# Patient Record
Sex: Female | Born: 1945
Health system: Southern US, Community
[De-identification: ages and names within clinical notes are randomized; demographics above are authoritative.]

## PROBLEM LIST (undated history)

## (undated) DIAGNOSIS — M8580 Other specified disorders of bone density and structure, unspecified site: Secondary | ICD-10-CM

## (undated) DIAGNOSIS — E559 Vitamin D deficiency, unspecified: Secondary | ICD-10-CM

## (undated) DIAGNOSIS — E669 Obesity, unspecified: Secondary | ICD-10-CM

## (undated) DIAGNOSIS — C801 Malignant (primary) neoplasm, unspecified: Secondary | ICD-10-CM

## (undated) DIAGNOSIS — H9191 Unspecified hearing loss, right ear: Secondary | ICD-10-CM

## (undated) DIAGNOSIS — I714 Abdominal aortic aneurysm, without rupture, unspecified: Secondary | ICD-10-CM

## (undated) DIAGNOSIS — Z889 Allergy status to unspecified drugs, medicaments and biological substances status: Secondary | ICD-10-CM

## (undated) DIAGNOSIS — R590 Localized enlarged lymph nodes: Secondary | ICD-10-CM

## (undated) DIAGNOSIS — H8110 Benign paroxysmal vertigo, unspecified ear: Secondary | ICD-10-CM

## (undated) DIAGNOSIS — H348192 Central retinal vein occlusion, unspecified eye, stable: Secondary | ICD-10-CM

## (undated) DIAGNOSIS — R55 Syncope and collapse: Secondary | ICD-10-CM

## (undated) DIAGNOSIS — H3530 Unspecified macular degeneration: Secondary | ICD-10-CM

## (undated) HISTORY — DX: Obesity, unspecified: E66.9

## (undated) HISTORY — PX: CHOLECYSTECTOMY: SHX55

## (undated) HISTORY — PX: CATARACT EXTRACTION, BILATERAL: SHX1313

## (undated) HISTORY — DX: Other specified disorders of bone density and structure, unspecified site: M85.80

## (undated) HISTORY — DX: Unspecified hearing loss, right ear: H91.91

## (undated) HISTORY — DX: Abdominal aortic aneurysm, without rupture: I71.4

## (undated) HISTORY — DX: Vitamin D deficiency, unspecified: E55.9

## (undated) HISTORY — DX: Benign paroxysmal vertigo, unspecified ear: H81.10

## (undated) HISTORY — PX: TUBAL LIGATION: SHX77

## (undated) HISTORY — DX: Central retinal vein occlusion, unspecified eye, stable: H34.8192

## (undated) HISTORY — DX: Abdominal aortic aneurysm, without rupture, unspecified: I71.40

---

## 2006-07-29 ENCOUNTER — Encounter: Admission: RE | Admit: 2006-07-29 | Discharge: 2006-07-29 | Payer: Self-pay | Admitting: Family Medicine

## 2006-10-07 ENCOUNTER — Other Ambulatory Visit: Admission: RE | Admit: 2006-10-07 | Discharge: 2006-10-07 | Payer: Self-pay | Admitting: Family Medicine

## 2006-10-16 ENCOUNTER — Encounter: Admission: RE | Admit: 2006-10-16 | Discharge: 2006-10-16 | Payer: Self-pay | Admitting: Family Medicine

## 2007-05-18 ENCOUNTER — Emergency Department (HOSPITAL_COMMUNITY): Admission: EM | Admit: 2007-05-18 | Discharge: 2007-05-19 | Payer: Self-pay | Admitting: Emergency Medicine

## 2007-09-25 ENCOUNTER — Encounter: Admission: RE | Admit: 2007-09-25 | Discharge: 2007-09-25 | Payer: Self-pay | Admitting: Family Medicine

## 2008-11-30 ENCOUNTER — Encounter: Admission: RE | Admit: 2008-11-30 | Discharge: 2008-11-30 | Payer: Self-pay | Admitting: Family Medicine

## 2009-04-12 ENCOUNTER — Encounter: Admission: RE | Admit: 2009-04-12 | Discharge: 2009-04-12 | Payer: Self-pay | Admitting: Family Medicine

## 2010-01-04 ENCOUNTER — Encounter: Admission: RE | Admit: 2010-01-04 | Discharge: 2010-01-04 | Payer: Self-pay | Admitting: Family Medicine

## 2010-07-25 ENCOUNTER — Other Ambulatory Visit: Payer: Self-pay | Admitting: Family Medicine

## 2010-07-25 DIAGNOSIS — R51 Headache: Secondary | ICD-10-CM

## 2010-08-01 ENCOUNTER — Ambulatory Visit
Admission: RE | Admit: 2010-08-01 | Discharge: 2010-08-01 | Disposition: A | Discharge status: Home / Self Care | Payer: Medicare Other | Source: Ambulatory Visit | Attending: Family Medicine | Admitting: Family Medicine

## 2010-08-01 DIAGNOSIS — R51 Headache: Secondary | ICD-10-CM

## 2010-08-03 ENCOUNTER — Other Ambulatory Visit: Payer: Self-pay

## 2011-01-21 ENCOUNTER — Other Ambulatory Visit: Payer: Self-pay | Admitting: Family Medicine

## 2011-01-21 DIAGNOSIS — Z1231 Encounter for screening mammogram for malignant neoplasm of breast: Secondary | ICD-10-CM

## 2011-01-30 ENCOUNTER — Ambulatory Visit
Admission: RE | Admit: 2011-01-30 | Discharge: 2011-01-30 | Disposition: A | Discharge status: Home / Self Care | Payer: Medicare Other | Source: Ambulatory Visit | Attending: Family Medicine | Admitting: Family Medicine

## 2011-01-30 DIAGNOSIS — Z1231 Encounter for screening mammogram for malignant neoplasm of breast: Secondary | ICD-10-CM

## 2011-02-05 ENCOUNTER — Other Ambulatory Visit: Payer: Self-pay | Admitting: Family Medicine

## 2011-02-05 DIAGNOSIS — R928 Other abnormal and inconclusive findings on diagnostic imaging of breast: Secondary | ICD-10-CM

## 2011-02-08 ENCOUNTER — Ambulatory Visit
Admission: RE | Admit: 2011-02-08 | Discharge: 2011-02-08 | Disposition: A | Discharge status: Home / Self Care | Payer: Medicare Other | Source: Ambulatory Visit | Attending: Family Medicine | Admitting: Family Medicine

## 2011-02-08 DIAGNOSIS — R928 Other abnormal and inconclusive findings on diagnostic imaging of breast: Secondary | ICD-10-CM

## 2011-03-11 ENCOUNTER — Emergency Department (HOSPITAL_BASED_OUTPATIENT_CLINIC_OR_DEPARTMENT_OTHER)
Admission: EM | Admit: 2011-03-11 | Discharge: 2011-03-11 | Disposition: A | Discharge status: Home / Self Care | Payer: Medicare Other | Attending: Emergency Medicine | Admitting: Emergency Medicine

## 2011-03-11 ENCOUNTER — Encounter (HOSPITAL_BASED_OUTPATIENT_CLINIC_OR_DEPARTMENT_OTHER): Payer: Self-pay | Admitting: Family Medicine

## 2011-03-11 DIAGNOSIS — X500XXA Overexertion from strenuous movement or load, initial encounter: Secondary | ICD-10-CM | POA: Insufficient documentation

## 2011-03-11 DIAGNOSIS — S2341XA Sprain of ribs, initial encounter: Secondary | ICD-10-CM | POA: Insufficient documentation

## 2011-03-11 DIAGNOSIS — S29011A Strain of muscle and tendon of front wall of thorax, initial encounter: Secondary | ICD-10-CM

## 2011-03-11 HISTORY — DX: Unspecified macular degeneration: H35.30

## 2011-03-11 MED ORDER — IBUPROFEN 800 MG PO TABS
800.0000 mg | ORAL_TABLET | Freq: Once | ORAL | Status: AC
Start: 2011-03-11 — End: 2011-03-11
  Administered 2011-03-11: 800 mg via ORAL
  Filled 2011-03-11: qty 1

## 2011-03-11 MED ORDER — IBUPROFEN 800 MG PO TABS: 800.0000 mg | ORAL_TABLET | Freq: Three times a day (TID) | ORAL | Status: AC

## 2011-03-11 NOTE — ED Provider Notes (Addendum)
History     CSN: [id number] Arrival date & time: 03/11/2011 12:19 PM   Chief Complaint  Patient presents with  . Back Pain     (Include location/radiation/quality/duration/timing/severity/associated sxs/prior treatment) Patient is a 65 y.o. female presenting with back pain.  Back Pain  The current episode started 3 to 5 hours ago.   The patient states she was lifting a 35 pound bag of bird seed out of her back trunk when she strained the area across her left mid upper back and rib cage area. She denies any numbness or tingling denies any focal motor deficits. Denies any chest pain or pain with inspiration. She did not take any medications prior to arrival  Past Medical History  Diagnosis Date  . Macular degeneration      Past Surgical History  Procedure Date  . Cholecystectomy     No family history on file.  History  Substance Use Topics  . Smoking status: Never Smoker   . Smokeless tobacco: Not on file  . Alcohol Use: Yes    OB History    Grav Para Term Preterm Abortions TAB SAB Ect Mult Living                  Review of Systems  Musculoskeletal: Positive for back pain.  All other systems reviewed and are negative.    Allergies  Review of patient's allergies indicates no known allergies.  Home Medications  No current outpatient prescriptions on file.  Physical Exam    BP 154/82  Pulse 68  Temp(Src) 97.9 F (36.6 C) (Oral)  Resp 16  Ht 5\' 9"  (1.753 m)  Wt 203 lb (92.08 kg)  BMI 29.98 kg/m2  SpO2 100%  Physical Exam  Constitutional: She is oriented to person, place, and time. She appears well-developed and well-nourished.  HENT:  Head: Normocephalic and atraumatic.  Eyes: Conjunctivae and EOM are normal. Pupils are equal, round, and reactive to light.  Neck: Neck supple.  Cardiovascular: Normal rate and regular rhythm.  Exam reveals no gallop and no friction rub.   No murmur heard. Pulmonary/Chest: Breath sounds normal. No respiratory  distress. She has no wheezes. She has no rales. She exhibits no tenderness.  Abdominal: Soft. Bowel sounds are normal. She exhibits no distension. There is no tenderness. There is no rebound and no guarding.  Musculoskeletal: Normal range of motion.  Neurological: She is alert and oriented to person, place, and time. No cranial nerve deficit. Coordination normal.  Skin: Skin is warm and dry. No rash noted.  Psychiatric: She has a normal mood and affect.    ED Course  Procedures  No results found for this or any previous visit. No results found.   No diagnosis found.   MDM Pt is seen and examined;  Initial history and physical completed.  Will follow.         Wyat Infinger A. Patrica Duel, MD 03/11/11 1243  Theron Arista A. Patrica Duel, MD 03/11/11 1244

## 2011-03-11 NOTE — ED Notes (Signed)
Pt c/o left sided back pain that started approximately 1 hr after lifting something heavy. Pt denies other symptoms.

## 2011-04-02 LAB — BASIC METABOLIC PANEL
BUN: 8
Calcium: 9.7
Chloride: 105
Creatinine, Ser: 0.69

## 2011-04-02 LAB — CBC
HCT: 38.5
Hemoglobin: 13.4
MCHC: 34.8
MCV: 86.2
Platelets: 383
WBC: 9.2

## 2011-04-02 LAB — DIFFERENTIAL
Basophils Absolute: 0
Basophils Relative: 0
Monocytes Absolute: 0.3

## 2012-02-25 ENCOUNTER — Other Ambulatory Visit: Payer: Self-pay | Admitting: Family Medicine

## 2012-02-25 DIAGNOSIS — Z1231 Encounter for screening mammogram for malignant neoplasm of breast: Secondary | ICD-10-CM

## 2012-03-04 ENCOUNTER — Ambulatory Visit
Admission: RE | Admit: 2012-03-04 | Discharge: 2012-03-04 | Disposition: A | Discharge status: Home / Self Care | Payer: Medicare Other | Source: Ambulatory Visit | Attending: Family Medicine | Admitting: Family Medicine

## 2012-03-04 DIAGNOSIS — Z1231 Encounter for screening mammogram for malignant neoplasm of breast: Secondary | ICD-10-CM

## 2013-02-12 ENCOUNTER — Other Ambulatory Visit: Payer: Self-pay | Admitting: Family Medicine

## 2013-02-12 DIAGNOSIS — M7989 Other specified soft tissue disorders: Secondary | ICD-10-CM

## 2013-02-15 ENCOUNTER — Ambulatory Visit
Admission: RE | Admit: 2013-02-15 | Discharge: 2013-02-15 | Disposition: A | Discharge status: Home / Self Care | Payer: Medicare Other | Source: Ambulatory Visit | Attending: Family Medicine | Admitting: Family Medicine

## 2013-02-15 DIAGNOSIS — M7989 Other specified soft tissue disorders: Secondary | ICD-10-CM

## 2013-05-06 ENCOUNTER — Other Ambulatory Visit: Payer: Self-pay

## 2013-05-06 DIAGNOSIS — Z1231 Encounter for screening mammogram for malignant neoplasm of breast: Secondary | ICD-10-CM

## 2013-06-09 ENCOUNTER — Ambulatory Visit
Admission: RE | Admit: 2013-06-09 | Discharge: 2013-06-09 | Disposition: A | Discharge status: Home / Self Care | Payer: Medicare Other | Source: Ambulatory Visit

## 2013-06-09 DIAGNOSIS — Z1231 Encounter for screening mammogram for malignant neoplasm of breast: Secondary | ICD-10-CM

## 2014-01-18 ENCOUNTER — Other Ambulatory Visit: Payer: Self-pay | Admitting: Family Medicine

## 2014-01-18 DIAGNOSIS — R634 Abnormal weight loss: Secondary | ICD-10-CM

## 2014-01-18 DIAGNOSIS — R109 Unspecified abdominal pain: Secondary | ICD-10-CM

## 2014-01-22 ENCOUNTER — Encounter: Payer: Self-pay | Admitting: *Deleted

## 2014-01-25 ENCOUNTER — Ambulatory Visit
Admission: RE | Admit: 2014-01-25 | Discharge: 2014-01-25 | Disposition: A | Discharge status: Home / Self Care | Payer: Medicare Other | Source: Ambulatory Visit | Attending: Family Medicine | Admitting: Family Medicine

## 2014-01-25 DIAGNOSIS — R634 Abnormal weight loss: Secondary | ICD-10-CM

## 2014-01-25 DIAGNOSIS — R109 Unspecified abdominal pain: Secondary | ICD-10-CM

## 2014-01-25 MED ORDER — IOHEXOL 300 MG/ML  SOLN
125.0000 mL | Freq: Once | INTRAMUSCULAR | Status: AC | PRN
  Administered 2014-01-25: 125 mL via INTRAVENOUS

## 2014-07-29 ENCOUNTER — Other Ambulatory Visit: Payer: Self-pay | Admitting: Gastroenterology

## 2014-07-29 ENCOUNTER — Other Ambulatory Visit: Payer: Self-pay

## 2014-07-29 DIAGNOSIS — Z1231 Encounter for screening mammogram for malignant neoplasm of breast: Secondary | ICD-10-CM

## 2014-07-29 DIAGNOSIS — R935 Abnormal findings on diagnostic imaging of other abdominal regions, including retroperitoneum: Secondary | ICD-10-CM

## 2014-08-11 ENCOUNTER — Encounter (INDEPENDENT_AMBULATORY_CARE_PROVIDER_SITE_OTHER): Payer: Self-pay

## 2014-08-11 ENCOUNTER — Ambulatory Visit
Admission: RE | Admit: 2014-08-11 | Discharge: 2014-08-11 | Disposition: A | Discharge status: Home / Self Care | Payer: Medicare Other | Source: Ambulatory Visit

## 2014-08-11 DIAGNOSIS — Z1231 Encounter for screening mammogram for malignant neoplasm of breast: Secondary | ICD-10-CM

## 2014-08-30 ENCOUNTER — Other Ambulatory Visit: Payer: Medicare Other

## 2014-08-30 ENCOUNTER — Ambulatory Visit
Admission: RE | Admit: 2014-08-30 | Discharge: 2014-08-30 | Disposition: A | Discharge status: Home / Self Care | Payer: Medicare Other | Source: Ambulatory Visit | Attending: Gastroenterology | Admitting: Gastroenterology

## 2014-08-30 DIAGNOSIS — R935 Abnormal findings on diagnostic imaging of other abdominal regions, including retroperitoneum: Secondary | ICD-10-CM

## 2014-08-30 MED ORDER — IOPAMIDOL (ISOVUE-300) INJECTION 61%
125.0000 mL | Freq: Once | INTRAVENOUS | Status: AC | PRN
  Administered 2014-08-30: 125 mL via INTRAVENOUS

## 2014-10-24 ENCOUNTER — Other Ambulatory Visit: Payer: Self-pay | Admitting: Gastroenterology

## 2014-10-24 DIAGNOSIS — R140 Abdominal distension (gaseous): Secondary | ICD-10-CM

## 2014-10-24 DIAGNOSIS — K769 Liver disease, unspecified: Secondary | ICD-10-CM

## 2014-10-26 ENCOUNTER — Other Ambulatory Visit: Payer: Medicare Other

## 2014-10-27 ENCOUNTER — Ambulatory Visit
Admission: RE | Admit: 2014-10-27 | Discharge: 2014-10-27 | Disposition: A | Discharge status: Home / Self Care | Payer: Medicare Other | Source: Ambulatory Visit | Attending: Gastroenterology | Admitting: Gastroenterology

## 2014-10-27 ENCOUNTER — Other Ambulatory Visit: Payer: Self-pay | Admitting: Gastroenterology

## 2014-10-27 DIAGNOSIS — N838 Other noninflammatory disorders of ovary, fallopian tube and broad ligament: Secondary | ICD-10-CM

## 2014-10-27 DIAGNOSIS — R140 Abdominal distension (gaseous): Secondary | ICD-10-CM

## 2014-12-19 ENCOUNTER — Other Ambulatory Visit: Payer: Self-pay

## 2015-02-09 ENCOUNTER — Emergency Department (HOSPITAL_BASED_OUTPATIENT_CLINIC_OR_DEPARTMENT_OTHER): Payer: Medicare Other

## 2015-02-09 ENCOUNTER — Encounter (HOSPITAL_BASED_OUTPATIENT_CLINIC_OR_DEPARTMENT_OTHER): Payer: Self-pay | Admitting: Emergency Medicine

## 2015-02-09 ENCOUNTER — Emergency Department (HOSPITAL_BASED_OUTPATIENT_CLINIC_OR_DEPARTMENT_OTHER)
Admission: EM | Admit: 2015-02-09 | Discharge: 2015-02-09 | Disposition: A | Discharge status: Home / Self Care | Payer: Medicare Other | Attending: Emergency Medicine | Admitting: Emergency Medicine

## 2015-02-09 DIAGNOSIS — Z8739 Personal history of other diseases of the musculoskeletal system and connective tissue: Secondary | ICD-10-CM | POA: Insufficient documentation

## 2015-02-09 DIAGNOSIS — R040 Epistaxis: Secondary | ICD-10-CM | POA: Insufficient documentation

## 2015-02-09 DIAGNOSIS — Z792 Long term (current) use of antibiotics: Secondary | ICD-10-CM | POA: Diagnosis not present

## 2015-02-09 DIAGNOSIS — Z7951 Long term (current) use of inhaled steroids: Secondary | ICD-10-CM | POA: Diagnosis not present

## 2015-02-09 DIAGNOSIS — J189 Pneumonia, unspecified organism: Secondary | ICD-10-CM

## 2015-02-09 DIAGNOSIS — Z79899 Other long term (current) drug therapy: Secondary | ICD-10-CM | POA: Insufficient documentation

## 2015-02-09 DIAGNOSIS — H9191 Unspecified hearing loss, right ear: Secondary | ICD-10-CM | POA: Insufficient documentation

## 2015-02-09 DIAGNOSIS — Z87891 Personal history of nicotine dependence: Secondary | ICD-10-CM | POA: Diagnosis not present

## 2015-02-09 DIAGNOSIS — J159 Unspecified bacterial pneumonia: Secondary | ICD-10-CM | POA: Insufficient documentation

## 2015-02-09 DIAGNOSIS — R0989 Other specified symptoms and signs involving the circulatory and respiratory systems: Secondary | ICD-10-CM | POA: Diagnosis present

## 2015-02-09 DIAGNOSIS — E669 Obesity, unspecified: Secondary | ICD-10-CM | POA: Diagnosis not present

## 2015-02-09 MED ORDER — PREDNISONE 20 MG PO TABS: 40.0000 mg | ORAL_TABLET | Freq: Every day | ORAL | Status: DC

## 2015-02-09 MED ORDER — PREDNISONE 20 MG PO TABS
40.0000 mg | ORAL_TABLET | Freq: Once | ORAL | Status: AC
  Administered 2015-02-09: 40 mg via ORAL
  Filled 2015-02-09: qty 2

## 2015-02-09 MED ORDER — ALBUTEROL SULFATE HFA 108 (90 BASE) MCG/ACT IN AERS
2.0000 | INHALATION_SPRAY | Freq: Once | RESPIRATORY_TRACT | Status: AC
  Administered 2015-02-09: 2 via RESPIRATORY_TRACT
  Filled 2015-02-09: qty 6.7

## 2015-02-09 MED ORDER — ALBUTEROL SULFATE HFA 108 (90 BASE) MCG/ACT IN AERS: 2.0000 | INHALATION_SPRAY | RESPIRATORY_TRACT | Status: DC | PRN

## 2015-02-09 NOTE — Discharge Instructions (Signed)
You were seen today for shortness of breath. Your x-ray shows a possible small pneumonia. You are on antibiotics. Given your history of smoking and bronchitis, he will be started on steroid-dependent inhaler. Follow-up with her primary doctor in 1-2 days. If you develop fevers, worsening shortness of breath or any new or worsening symptoms, you should be reevaluated immediately.   Pneumonia Pneumonia is an infection of the lungs.  CAUSES Pneumonia may be caused by bacteria or a virus. Usually, these infections are caused by breathing infectious particles into the lungs (respiratory tract). SIGNS AND SYMPTOMS   Cough.  Fever.  Chest pain.  Increased rate of breathing.  Wheezing.  Mucus production. DIAGNOSIS  If you have the common symptoms of pneumonia, your health care provider will typically confirm the diagnosis with a chest X-ray. The X-ray will show an abnormality in the lung (pulmonary infiltrate) if you have pneumonia. Other tests of your blood, urine, or sputum may be done to find the specific cause of your pneumonia. Your health care provider may also do tests (blood gases or pulse oximetry) to see how well your lungs are working. TREATMENT  Some forms of pneumonia may be spread to other people when you cough or sneeze. You may be asked to wear a mask before and during your exam. Pneumonia that is caused by bacteria is treated with antibiotic medicine. Pneumonia that is caused by the influenza virus may be treated with an antiviral medicine. Most other viral infections must run their course. These infections will not respond to antibiotics.  HOME CARE INSTRUCTIONS   Cough suppressants may be used if you are losing too much rest. However, coughing protects you by clearing your lungs. You should avoid using cough suppressants if you can.  Your health care provider may have prescribed medicine if he or she thinks your pneumonia is caused by bacteria or influenza. Finish your medicine  even if you start to feel better.  Your health care provider may also prescribe an expectorant. This loosens the mucus to be coughed up.  Take medicines only as directed by your health care provider.  Do not smoke. Smoking is a common cause of bronchitis and can contribute to pneumonia. If you are a smoker and continue to smoke, your cough may last several weeks after your pneumonia has cleared.  A cold steam vaporizer or humidifier in your room or home may help loosen mucus.  Coughing is often worse at night. Sleeping in a semi-upright position in a recliner or using a couple pillows under your head will help with this.  Get rest as you feel it is needed. Your body will usually let you know when you need to rest. PREVENTION A pneumococcal shot (vaccine) is available to prevent a common bacterial cause of pneumonia. This is usually suggested for:  People over 35 years old.  Patients on chemotherapy.  People with chronic lung problems, such as bronchitis or emphysema.  People with immune system problems. If you are over 65 or have a high risk condition, you may receive the pneumococcal vaccine if you have not received it before. In some countries, a routine influenza vaccine is also recommended. This vaccine can help prevent some cases of pneumonia.You may be offered the influenza vaccine as part of your care. If you smoke, it is time to quit. You may receive instructions on how to stop smoking. Your health care provider can provide medicines and counseling to help you quit. SEEK MEDICAL CARE IF: You have a  fever. SEEK IMMEDIATE MEDICAL CARE IF:   Your illness becomes worse. This is especially true if you are elderly or weakened from any other disease.  You cannot control your cough with suppressants and are losing sleep.  You begin coughing up blood.  You develop pain which is getting worse or is uncontrolled with medicines.  Any of the symptoms which initially brought you in  for treatment are getting worse rather than better.  You develop shortness of breath or chest pain. MAKE SURE YOU:   Understand these instructions.  Will watch your condition.  Will get help right away if you are not doing well or get worse. Document Released: 06/10/2005 Document Revised: 10/25/2013 Document Reviewed: 08/30/2010 [facility] Patient Information 2015 [place], [place]. This information is not intended to replace advice given to you by your health care provider. Make sure you discuss any questions you have with your health care provider.

## 2015-02-09 NOTE — ED Provider Notes (Signed)
CSN: [id number]     Arrival date & time 02/09/15  0522 History   First MD Initiated Contact with Patient 02/09/15 0530     Chief Complaint  Patient presents with  . Sinus Problem     (Consider location/radiation/quality/duration/timing/severity/associated sxs/prior Treatment) HPI  This is a 69 year old female who presents with chest congestion and sinus pressure. Patient reports that proximal leg 1 week ago she started having sinus pressure and developed a nosebleed. She was seen by her doctor and had her nosebleed cauterized. She is placed on a Z-Pak on Tuesday. Since that time she has developed continued "chest congestion." She reports dry cough that is worse at night. Denies any fevers. Denies any chest pain. She states that she just feels uncomfortable and is unable to sleep. She states "I just feel like my airways won't open up." Patient reports remote smoking history and has previously required inhalers. She denies any dyspnea on exertion. She denies any other symptoms including nausea, vomiting, abdominal pain, diarrhea.  Past Medical History  Diagnosis Date  . Macular degeneration   . Retinal vein occlusion   . Hearing loss in right ear   . Vitamin D deficiency   . Osteopenia   . Obesity   . BPPV (benign paroxysmal positional vertigo)    Past Surgical History  Procedure Laterality Date  . Cholecystectomy    . Tubal ligation     Family History  Problem Relation Age of Onset  . Cancer Mother     liver  . Heart attack Father   . Hypertension Father   . Diabetes Mellitus I Father   . Cancer Sister     pancreatic, breast  . Diabetes Mellitus I Sister   . Heart disease Brother   . Heart disease Brother   . Heart disease Brother   . Diabetes Mellitus I Sister   . Diabetes Mellitus I Sister    Social History  Substance Use Topics  . Smoking status: Former Smoker    Quit date: 06/24/1988  . Smokeless tobacco: None  . Alcohol Use: No   OB History    No data  available     Review of Systems  Constitutional: Negative for fever.  HENT: Positive for congestion, nosebleeds and sinus pressure. Negative for sore throat.   Respiratory: Positive for cough and shortness of breath. Negative for chest tightness.   Cardiovascular: Negative for chest pain and leg swelling.  Gastrointestinal: Negative for nausea, vomiting and abdominal pain.  Genitourinary: Negative for dysuria.  Neurological: Negative for headaches.  Psychiatric/Behavioral: Negative for confusion.  All other systems reviewed and are negative.     Allergies  Review of patient's allergies indicates no known allergies.  Home Medications   Prior to Admission medications   Medication Sig Start Date End Date Taking? Authorizing Provider  acetaminophen (TYLENOL) 500 MG tablet Take 500 mg by mouth every 6 (six) hours as needed.   Yes Historical Provider, MD  azithromycin (ZITHROMAX) 250 MG tablet Take by mouth daily.   Yes Historical Provider, MD  Fexofenadine HCl (ALLEGRA PO) Take by mouth as needed.   Yes Historical Provider, MD  montelukast (SINGULAIR) 10 MG tablet Take 10 mg by mouth at bedtime.   Yes Historical Provider, MD  omeprazole (PRILOSEC) 20 MG capsule Take 20 mg by mouth daily.   Yes Historical Provider, MD  Probiotic Product (PROBIOTIC DAILY PO) Take by mouth.   Yes Historical Provider, MD  simethicone (GAS-X) 80 MG chewable tablet Chew 80 mg by  mouth every 6 (six) hours as needed for flatulence.   Yes Historical Provider, MD  triamcinolone (NASACORT AQ) 55 MCG/ACT AERO nasal inhaler Place 2 sprays into the nose daily.   Yes Historical Provider, MD  albuterol (PROVENTIL HFA;VENTOLIN HFA) 108 (90 BASE) MCG/ACT inhaler Inhale 2 puffs into the lungs every 4 (four) hours as needed for wheezing or shortness of breath. 02/09/15   Merryl Hacker, MD  predniSONE (DELTASONE) 20 MG tablet Take 2 tablets (40 mg total) by mouth daily with breakfast. 02/09/15   Merryl Hacker, MD   BP  150/60 mmHg  Pulse 75  Temp(Src) 98.2 F (36.8 C) (Oral)  Resp 18  Ht 5' 8.5" (1.74 m)  Wt 205 lb (92.987 kg)  BMI 30.71 kg/m2  SpO2 98% Physical Exam  Constitutional: She is oriented to person, place, and time. She appears well-developed and well-nourished.  Appears younger than stated age  HENT:  Head: Normocephalic and atraumatic.  Mouth/Throat: Oropharynx is clear and moist.  Eyes: EOM are normal. Pupils are equal, round, and reactive to light.  Neck: Neck supple.  Cardiovascular: Normal rate, regular rhythm and normal heart sounds.   No murmur heard. Pulmonary/Chest: Effort normal. No respiratory distress. She has no wheezes.  Abdominal: Soft. Bowel sounds are normal. There is no tenderness. There is no rebound.  Musculoskeletal: She exhibits no edema.  Neurological: She is alert and oriented to person, place, and time.  Skin: Skin is warm and dry.  Psychiatric: She has a normal mood and affect.  Nursing note and vitals reviewed.   ED Course  Procedures (including critical care time) Labs Review Labs Reviewed - No data to display  Imaging Review Dg Chest 2 View  02/09/2015   CLINICAL DATA:  Chest congestion and cough.  EXAM: CHEST  2 VIEW  COMPARISON:  None.  FINDINGS: No cardiomegaly.  Aortic tortuosity, mild for age.  There is a subtle infiltrate in the lingula in both the frontal and lateral projections. No edema, effusion, or pneumothorax.  IMPRESSION: Subtle lingular opacity concerning for pneumonia. Followup PA and lateral chest X-ray is recommended in 3-4 weeks following trial of antibiotic therapy to ensure resolution and exclude underlying malignancy.   Electronically Signed   By: Monte Fantasia M.D.   On: 02/09/2015 06:17   I have personally reviewed and evaluated these images and lab results as part of my medical decision-making.   EKG Interpretation None      MDM   Final diagnoses:  CAP (community acquired pneumonia)    Patient presents with chest  congestion. Has taken 2 doses of the Z-Pak. Reports difficulty sleeping at night. Nontoxic on exam. Afebrile and vital signs are reassuring. No evidence of wheezing on exam but has a history of bronchitis and smoking. For this reason, patient was given prednisone and trialed with an inhaler. Chest x-ray was obtained and shows a subtle lingular opacity which might be pneumonia. Patient has been afebrile but has had a cough. Following albuterol, patient states that she feels somewhat better and chest remains clear. She ambulated and maintained her pulse ox greater than 96%. She did develop mild tachycardia during ambulation but is status post albuterol. Discussed with patient course of treatment. We discussed adding albuterol and steriods to her Z-Pak versus changing her antibiotics. At this time, given the subtlety of the findings on the x-ray and the patient's overall clinical exam and appearance as well as the fact that she is only been on 2 days of antibiotics,  we elected to keep patient on current antibiotic course and add bronchitis treatment. Patient will follow-up with her primary physician. She was given strict return precautions.  After history, exam, and medical workup I feel the patient has been appropriately medically screened and is safe for discharge home. Pertinent diagnoses were discussed with the patient. Patient was given return precautions.     Merryl Hacker, MD 02/09/15 [phone]

## 2015-02-09 NOTE — ED Notes (Signed)
Pt reports chest congestion and sinus congestion x 3 days that she has been unable to get rid of, unable to sleep

## 2015-10-10 ENCOUNTER — Other Ambulatory Visit: Payer: Self-pay

## 2015-10-10 DIAGNOSIS — Z1231 Encounter for screening mammogram for malignant neoplasm of breast: Secondary | ICD-10-CM

## 2015-10-26 ENCOUNTER — Ambulatory Visit: Payer: Medicare Other

## 2015-11-09 ENCOUNTER — Ambulatory Visit
Admission: RE | Admit: 2015-11-09 | Discharge: 2015-11-09 | Disposition: A | Discharge status: Home / Self Care | Payer: Medicare Other | Source: Ambulatory Visit

## 2015-11-09 DIAGNOSIS — Z1231 Encounter for screening mammogram for malignant neoplasm of breast: Secondary | ICD-10-CM

## 2015-12-18 ENCOUNTER — Other Ambulatory Visit: Payer: Self-pay | Admitting: Gastroenterology

## 2015-12-18 DIAGNOSIS — K769 Liver disease, unspecified: Secondary | ICD-10-CM

## 2016-01-01 ENCOUNTER — Other Ambulatory Visit: Payer: Medicare Other

## 2016-01-02 ENCOUNTER — Ambulatory Visit
Admission: RE | Admit: 2016-01-02 | Discharge: 2016-01-02 | Disposition: A | Discharge status: Home / Self Care | Payer: Medicare Other | Source: Ambulatory Visit | Attending: Gastroenterology | Admitting: Gastroenterology

## 2016-01-02 DIAGNOSIS — K769 Liver disease, unspecified: Secondary | ICD-10-CM

## 2016-01-02 MED ORDER — IOPAMIDOL (ISOVUE-300) INJECTION 61%
125.0000 mL | Freq: Once | INTRAVENOUS | Status: AC | PRN
  Administered 2016-01-02: 125 mL via INTRAVENOUS

## 2016-07-29 DIAGNOSIS — J309 Allergic rhinitis, unspecified: Secondary | ICD-10-CM | POA: Diagnosis not present

## 2016-09-02 DIAGNOSIS — H348312 Tributary (branch) retinal vein occlusion, right eye, stable: Secondary | ICD-10-CM | POA: Diagnosis not present

## 2016-09-02 DIAGNOSIS — H359 Unspecified retinal disorder: Secondary | ICD-10-CM | POA: Diagnosis not present

## 2016-09-02 DIAGNOSIS — H33322 Round hole, left eye: Secondary | ICD-10-CM | POA: Diagnosis not present

## 2016-09-02 DIAGNOSIS — H2513 Age-related nuclear cataract, bilateral: Secondary | ICD-10-CM | POA: Diagnosis not present

## 2016-09-02 DIAGNOSIS — H31009 Unspecified chorioretinal scars, unspecified eye: Secondary | ICD-10-CM | POA: Diagnosis not present

## 2016-09-24 DIAGNOSIS — H353131 Nonexudative age-related macular degeneration, bilateral, early dry stage: Secondary | ICD-10-CM | POA: Diagnosis not present

## 2016-09-24 DIAGNOSIS — H524 Presbyopia: Secondary | ICD-10-CM | POA: Diagnosis not present

## 2016-09-24 DIAGNOSIS — H25813 Combined forms of age-related cataract, bilateral: Secondary | ICD-10-CM | POA: Diagnosis not present

## 2016-10-03 DIAGNOSIS — J209 Acute bronchitis, unspecified: Secondary | ICD-10-CM | POA: Diagnosis not present

## 2016-10-03 DIAGNOSIS — J329 Chronic sinusitis, unspecified: Secondary | ICD-10-CM | POA: Diagnosis not present

## 2016-10-23 DIAGNOSIS — H2511 Age-related nuclear cataract, right eye: Secondary | ICD-10-CM | POA: Diagnosis not present

## 2016-11-04 DIAGNOSIS — H2511 Age-related nuclear cataract, right eye: Secondary | ICD-10-CM | POA: Diagnosis not present

## 2016-11-04 DIAGNOSIS — H25811 Combined forms of age-related cataract, right eye: Secondary | ICD-10-CM | POA: Diagnosis not present

## 2016-11-12 DIAGNOSIS — H2512 Age-related nuclear cataract, left eye: Secondary | ICD-10-CM | POA: Diagnosis not present

## 2016-11-13 DIAGNOSIS — E538 Deficiency of other specified B group vitamins: Secondary | ICD-10-CM | POA: Diagnosis not present

## 2016-11-13 DIAGNOSIS — Z0000 Encounter for general adult medical examination without abnormal findings: Secondary | ICD-10-CM | POA: Diagnosis not present

## 2016-11-13 DIAGNOSIS — H918X2 Other specified hearing loss, left ear: Secondary | ICD-10-CM | POA: Diagnosis not present

## 2016-11-13 DIAGNOSIS — Z131 Encounter for screening for diabetes mellitus: Secondary | ICD-10-CM | POA: Diagnosis not present

## 2016-11-13 DIAGNOSIS — J309 Allergic rhinitis, unspecified: Secondary | ICD-10-CM | POA: Diagnosis not present

## 2016-11-13 DIAGNOSIS — K5900 Constipation, unspecified: Secondary | ICD-10-CM | POA: Diagnosis not present

## 2016-11-13 DIAGNOSIS — Z6831 Body mass index (BMI) 31.0-31.9, adult: Secondary | ICD-10-CM | POA: Diagnosis not present

## 2016-11-13 DIAGNOSIS — E559 Vitamin D deficiency, unspecified: Secondary | ICD-10-CM | POA: Diagnosis not present

## 2016-11-13 DIAGNOSIS — K635 Polyp of colon: Secondary | ICD-10-CM | POA: Diagnosis not present

## 2016-11-13 DIAGNOSIS — E669 Obesity, unspecified: Secondary | ICD-10-CM | POA: Diagnosis not present

## 2016-11-27 DIAGNOSIS — H25812 Combined forms of age-related cataract, left eye: Secondary | ICD-10-CM | POA: Diagnosis not present

## 2016-11-27 DIAGNOSIS — H2512 Age-related nuclear cataract, left eye: Secondary | ICD-10-CM | POA: Diagnosis not present

## 2016-12-09 DIAGNOSIS — H10501 Unspecified blepharoconjunctivitis, right eye: Secondary | ICD-10-CM | POA: Diagnosis not present

## 2016-12-09 DIAGNOSIS — H353131 Nonexudative age-related macular degeneration, bilateral, early dry stage: Secondary | ICD-10-CM | POA: Diagnosis not present

## 2016-12-23 DIAGNOSIS — H6993 Unspecified Eustachian tube disorder, bilateral: Secondary | ICD-10-CM | POA: Diagnosis not present

## 2016-12-23 DIAGNOSIS — H903 Sensorineural hearing loss, bilateral: Secondary | ICD-10-CM | POA: Diagnosis not present

## 2016-12-30 ENCOUNTER — Other Ambulatory Visit: Payer: Self-pay | Admitting: Family Medicine

## 2016-12-30 DIAGNOSIS — Z1231 Encounter for screening mammogram for malignant neoplasm of breast: Secondary | ICD-10-CM

## 2017-01-06 DIAGNOSIS — M25561 Pain in right knee: Secondary | ICD-10-CM | POA: Diagnosis not present

## 2017-01-06 DIAGNOSIS — M179 Osteoarthritis of knee, unspecified: Secondary | ICD-10-CM | POA: Diagnosis not present

## 2017-01-08 DIAGNOSIS — Z0101 Encounter for examination of eyes and vision with abnormal findings: Secondary | ICD-10-CM | POA: Diagnosis not present

## 2017-01-09 DIAGNOSIS — M1711 Unilateral primary osteoarthritis, right knee: Secondary | ICD-10-CM | POA: Diagnosis not present

## 2017-01-15 ENCOUNTER — Ambulatory Visit
Admission: RE | Admit: 2017-01-15 | Discharge: 2017-01-15 | Disposition: A | Discharge status: Home / Self Care | Payer: Medicare HMO | Source: Ambulatory Visit | Attending: Family Medicine | Admitting: Family Medicine

## 2017-01-15 DIAGNOSIS — Z1231 Encounter for screening mammogram for malignant neoplasm of breast: Secondary | ICD-10-CM

## 2017-03-26 DIAGNOSIS — R69 Illness, unspecified: Secondary | ICD-10-CM | POA: Diagnosis not present

## 2017-04-09 DIAGNOSIS — E559 Vitamin D deficiency, unspecified: Secondary | ICD-10-CM | POA: Diagnosis not present

## 2017-04-09 DIAGNOSIS — J309 Allergic rhinitis, unspecified: Secondary | ICD-10-CM | POA: Diagnosis not present

## 2017-04-09 DIAGNOSIS — E538 Deficiency of other specified B group vitamins: Secondary | ICD-10-CM | POA: Diagnosis not present

## 2017-04-10 DIAGNOSIS — M1711 Unilateral primary osteoarthritis, right knee: Secondary | ICD-10-CM | POA: Diagnosis not present

## 2017-05-06 DIAGNOSIS — M1711 Unilateral primary osteoarthritis, right knee: Secondary | ICD-10-CM | POA: Diagnosis not present

## 2017-05-13 DIAGNOSIS — M1711 Unilateral primary osteoarthritis, right knee: Secondary | ICD-10-CM | POA: Diagnosis not present

## 2017-05-20 DIAGNOSIS — M1711 Unilateral primary osteoarthritis, right knee: Secondary | ICD-10-CM | POA: Diagnosis not present

## 2017-06-26 DIAGNOSIS — R590 Localized enlarged lymph nodes: Secondary | ICD-10-CM | POA: Diagnosis not present

## 2017-07-01 DIAGNOSIS — H35359 Cystoid macular degeneration, unspecified eye: Secondary | ICD-10-CM | POA: Diagnosis not present

## 2017-07-01 DIAGNOSIS — H33322 Round hole, left eye: Secondary | ICD-10-CM | POA: Diagnosis not present

## 2017-07-01 DIAGNOSIS — H31009 Unspecified chorioretinal scars, unspecified eye: Secondary | ICD-10-CM | POA: Diagnosis not present

## 2017-07-01 DIAGNOSIS — H348312 Tributary (branch) retinal vein occlusion, right eye, stable: Secondary | ICD-10-CM | POA: Diagnosis not present

## 2017-07-03 ENCOUNTER — Other Ambulatory Visit: Payer: Self-pay | Admitting: Family Medicine

## 2017-07-03 DIAGNOSIS — R221 Localized swelling, mass and lump, neck: Secondary | ICD-10-CM

## 2017-07-08 ENCOUNTER — Ambulatory Visit
Admission: RE | Admit: 2017-07-08 | Discharge: 2017-07-08 | Disposition: A | Discharge status: Home / Self Care | Payer: Medicare HMO | Source: Ambulatory Visit | Attending: Family Medicine | Admitting: Family Medicine

## 2017-07-08 DIAGNOSIS — R221 Localized swelling, mass and lump, neck: Secondary | ICD-10-CM | POA: Diagnosis not present

## 2017-07-11 DIAGNOSIS — H26493 Other secondary cataract, bilateral: Secondary | ICD-10-CM | POA: Diagnosis not present

## 2017-07-11 DIAGNOSIS — H353121 Nonexudative age-related macular degeneration, left eye, early dry stage: Secondary | ICD-10-CM | POA: Diagnosis not present

## 2017-07-11 DIAGNOSIS — H353212 Exudative age-related macular degeneration, right eye, with inactive choroidal neovascularization: Secondary | ICD-10-CM | POA: Diagnosis not present

## 2017-07-15 DIAGNOSIS — D473 Essential (hemorrhagic) thrombocythemia: Secondary | ICD-10-CM | POA: Diagnosis not present

## 2017-07-20 ENCOUNTER — Other Ambulatory Visit: Payer: Self-pay | Admitting: Family Medicine

## 2017-07-20 DIAGNOSIS — R221 Localized swelling, mass and lump, neck: Secondary | ICD-10-CM

## 2017-07-28 ENCOUNTER — Ambulatory Visit
Admission: RE | Admit: 2017-07-28 | Discharge: 2017-07-28 | Disposition: A | Discharge status: Home / Self Care | Payer: Medicare HMO | Source: Ambulatory Visit | Attending: Family Medicine | Admitting: Family Medicine

## 2017-07-28 ENCOUNTER — Other Ambulatory Visit: Payer: Medicare HMO

## 2017-07-28 DIAGNOSIS — R221 Localized swelling, mass and lump, neck: Secondary | ICD-10-CM | POA: Diagnosis not present

## 2017-07-28 MED ORDER — IOPAMIDOL (ISOVUE-300) INJECTION 61%
75.0000 mL | Freq: Once | INTRAVENOUS | Status: AC | PRN
  Administered 2017-07-28: 75 mL via INTRAVENOUS

## 2017-07-31 DIAGNOSIS — R590 Localized enlarged lymph nodes: Secondary | ICD-10-CM | POA: Diagnosis not present

## 2017-08-04 ENCOUNTER — Other Ambulatory Visit (HOSPITAL_COMMUNITY): Payer: Self-pay | Admitting: Family Medicine

## 2017-08-04 DIAGNOSIS — R590 Localized enlarged lymph nodes: Secondary | ICD-10-CM

## 2017-08-08 ENCOUNTER — Other Ambulatory Visit: Payer: Self-pay | Admitting: Radiology

## 2017-08-12 ENCOUNTER — Ambulatory Visit (HOSPITAL_COMMUNITY)
Admission: RE | Admit: 2017-08-12 | Discharge: 2017-08-12 | Disposition: A | Discharge status: Home / Self Care | Payer: Medicare HMO | Source: Ambulatory Visit | Attending: Family Medicine | Admitting: Family Medicine

## 2017-08-12 DIAGNOSIS — R590 Localized enlarged lymph nodes: Secondary | ICD-10-CM

## 2017-08-12 MED ORDER — LIDOCAINE HCL (PF) 1 % IJ SOLN
INTRAMUSCULAR | Status: AC
  Filled 2017-08-12: qty 10

## 2017-08-12 NOTE — Procedures (Signed)
Interventional Radiology Procedure Note  Procedure: US guided core biopsy of left cervical chain lymphadenopathy  Complications: None  Estimated Blood Loss: None  Recommendations: - DC Home - Path pending  Signed,  Criselda Peaches, MD

## 2017-08-26 DIAGNOSIS — R590 Localized enlarged lymph nodes: Secondary | ICD-10-CM | POA: Diagnosis not present

## 2017-08-27 ENCOUNTER — Telehealth: Payer: Self-pay | Admitting: Hematology

## 2017-08-27 NOTE — Telephone Encounter (Signed)
Appt has been scheduled for the pt to see Dr. Irene Limbo on 3/8 at 130pm. Pt aware to arrive 30 minutes early.

## 2017-08-28 NOTE — Progress Notes (Signed)
HEMATOLOGY/ONCOLOGY CONSULTATION NOTE  Date of Service: 08/29/2017  Patient Care Team: Kathyrn Lass, MD as PCP - General (Family Medicine)  CHIEF COMPLAINTS/PURPOSE OF CONSULTATION:  Concern for newly diagnosed Hodgkin's Lymphoma   HISTORY OF PRESENTING ILLNESS:   Jennifer Juarez is a wonderful 72 y.o. female who has been referred to [place] by Dr. Lattie Haw Miller/Courtney Rolland Porter PA_C  for evaluation and management of likely newly diagnosed Hodgkin's Lymphoma.   The pt reports that she is doing very well overall and notes that she is fully functional and does not have any significant chronic medical problems or any functional limitations.  She notes that on 06/26/17 she noticed a knot on the left side of her neck that subsequently precipitated her CT scan and Bx prior to seeing [place] today.   On 07/28/17 the pt had a CT Soft Tissue Neck revealing Enlarged lymph nodes in the left neck compatible with neoplasm.Enlarged lymph nodes in the left neck. Large left level 2 lymph node 23 x 30 mm. Multiple posterior lymph nodes are present measuring up to 10 mm. No enlarged lymph nodes in the right neck. Biopsy recommended. Attention left tonsil on direct mucosal inspection. Possible tonsillar carcinoma on the left. Lymphoma also in the differential.    Of note prior to the patient's visit, pt has had a Lymph node Needle/Core biopsy completed on 08/12/17 with results revealing ATYPICAL LYMPHOID PROLIFERATION. Immunohistochemical stains were performed including LCA, CD20, PAX-5, CD3, CD15, and CD30 with appropriate controls. The large atypical lymphoid-appearing cells are positive for CD30, CD15 and PAX-5 and negative for CD3, CD20 and LCA. The lymphocytic population in the background show a mixture of T and B-cells with predominance of T-cells. The overall findings are limited but atypical and worrisome for a lymphoproliferative process, particularly. classical Hodgkin lymphoma. Excisional biopsy is strongly  recommended.  Also on 08/12/17 the pt had a Tissue Flow Cytometry revealing NO MONOCLONAL B-CELL POPULATION OR ABNORMAL T-CELL PHENOTYPE IDENTIFIED.   Most recent CBC with Diff. results (07/15/17) revealed all values WNL.   She notes that she has had a viral infection in 2005 that resulted in a complete loss of hearing in her right ear. She notes that her left ear has had fluid build up and has resulted in significant loss of hearing, for which she uses hearing aids.  On review of systems, pt reports good energy levels, and denies fevers, chills, night sweats, unexpected weight loss, skin itching, rashes, soreness, sore throat, difficulties swallowing, back pains, abdominal pains, leg swelling, and any other symptoms.   On PMHx the pt reports arthritis, vertigo, polyps. She denies heart, lung, kidney or liver problems. She denies DM.  On Social Hx the pt notes having quit smoking when she was 71. She reports infrequent EOH consumption. She denies chemical or radiation exposure.  On Family Hx she reports that her mother had liver cancer. Her sister had breast cancer at 71 y/o and pancreatic cancer at 74 y/o.    MEDICAL HISTORY:  Past Medical History:  Diagnosis Date  . BPPV (benign paroxysmal positional vertigo)   . Hearing loss in right ear   . Macular degeneration   . Obesity   . Osteopenia   . Retinal vein occlusion   . Vitamin D deficiency     SURGICAL HISTORY: Past Surgical History:  Procedure Laterality Date  . CHOLECYSTECTOMY    . TUBAL LIGATION      SOCIAL HISTORY: Social History   Socioeconomic History  . Marital status:  Single    Spouse name: Not on file  . Number of children: Not on file  . Years of education: Not on file  . Highest education level: Not on file  Social Needs  . Financial resource strain: Not on file  . Food insecurity - worry: Not on file  . Food insecurity - inability: Not on file  . Transportation needs - medical: Not on file  .  Transportation needs - non-medical: Not on file  Occupational History  . Not on file  Tobacco Use  . Smoking status: Former Smoker    Last attempt to quit: 06/24/1988    Years since quitting: 29.2  . Smokeless tobacco: Never Used  Substance and Sexual Activity  . Alcohol use: No  . Drug use: No  . Sexual activity: Not on file  Other Topics Concern  . Not on file  Social History Narrative  . Not on file    FAMILY HISTORY: Family History  Problem Relation Age of Onset  . Cancer Mother        liver  . Heart attack Father   . Hypertension Father   . Diabetes Mellitus I Father   . Cancer Sister        pancreatic, breast  . Diabetes Mellitus I Sister   . Heart disease Brother   . Heart disease Brother   . Heart disease Brother   . Diabetes Mellitus I Sister   . Diabetes Mellitus I Sister     ALLERGIES:  is allergic to doxycycline; levothyroxine; prilosec [omeprazole]; and singulair [montelukast].  MEDICATIONS:  Current Outpatient Medications  Medication Sig Dispense Refill  . acetaminophen (TYLENOL) 500 MG tablet Take 500 mg by mouth every 8 (eight) hours as needed for mild pain or moderate pain.     . Multiple Vitamins-Minerals (HM MULTIVITAMIN ADULT GUMMY PO) Take 2 each by mouth daily.    Marland Kitchen triamcinolone (NASACORT AQ) 55 MCG/ACT AERO nasal inhaler Place 2 sprays into the nose daily.     No current facility-administered medications for this visit.     REVIEW OF SYSTEMS:    10 Point review of Systems was done is negative except as noted above.  PHYSICAL EXAMINATION: ECOG PERFORMANCE STATUS: 1 VS stable- reviewed in EPIC GENERAL:alert, in no acute distress and comfortable SKIN: no acute rashes, no significant lesions EYES: conjunctiva are pink and non-injected, sclera anicteric OROPHARYNX: MMM, no exudates, no oropharyngeal erythema or ulceration NECK: supple, no JVD LYMPH:  Palpable left mid neck and left posterior triangle LNadenopathy, no palpable  lymphadenopathy in the axillary or inguinal regions LUNGS: clear to auscultation b/l with normal respiratory effort HEART: regular rate & rhythm ABDOMEN:  normoactive bowel sounds , non tender, not distended. Extremity: no pedal edema PSYCH: alert & oriented x 3 with fluent speech NEURO: no focal motor/sensory deficits  LABORATORY DATA:  I have reviewed the data as listed  . CBC Latest Ref Rng & Units 08/29/2017 05/19/2007  WBC 3.9 - 10.3 K/uL 8.4 9.2  Hemoglobin - - 13.4  Hematocrit 34.8 - 46.6 % 39.8 38.5  Platelets 145 - 400 K/uL 351 383  HGB 12.8   CMP Latest Ref Rng & Units 08/29/2017 05/19/2007  Glucose 70 - 140 mg/dL 93 129(H)  BUN 7 - 26 mg/dL 8 8  Creatinine 0.60 - 1.10 mg/dL 0.88 0.69  Sodium 136 - 145 mmol/L 140 139  Potassium 3.5 - 5.1 mmol/L 3.8 3.8  Chloride 98 - 109 mmol/L 106 105  CO2 22 -  29 mmol/L 23 26  Calcium 8.4 - 10.4 mg/dL 10.0 9.7  Total Protein 6.4 - 8.3 g/dL 8.0 -  Total Bilirubin 0.2 - 1.2 mg/dL 0.3 -  Alkaline Phos 40 - 150 U/L 96 -  AST 5 - 34 U/L 19 -  ALT 0 - 55 U/L 13 -   Component     Latest Ref Rng & Units 08/29/2017  Retic Ct Pct     0.7 - 2.1 % 0.8  RBC.     3.70 - 5.45 MIL/uL 4.60  Retic Count, Absolute     33.7 - 90.7 K/uL 36.8  Sed Rate     0 - 22 mm/hr 30 (H)  LDH     125 - 245 U/L 179  HCV Ab     0.0 - 0.9 s/co ratio <0.1  Hepatitis B Surface Ag     Negative Negative  Hep B Core Ab, Tot     Negative Negative     08/12/17 Lymph Node Needle/core Biopsy:   08/12/17 Tissue Flow Cytometry:     RADIOGRAPHIC STUDIES: I have personally reviewed the radiological images as listed and agreed with the findings in the report. [place] Core Biopsy (lymph Nodes)  Result Date: 08/12/2017 INDICATION: 72 year old female with left cervical lymphadenopathy EXAM: Ultrasound-guided core biopsy, lymph node MEDICATIONS: None. ANESTHESIA/SEDATION: None. FLUOROSCOPY TIME:  None. COMPLICATIONS: None immediate. PROCEDURE: Informed written consent  was obtained from the patient after a thorough discussion of the procedural risks, benefits and alternatives. All questions were addressed. A timeout was performed prior to the initiation of the procedure. The left neck was interrogated with ultrasound. Multiple enlarged hypoechoic lymph nodes are identified, the largest measures 4.6 x 2.1 by 2.5 cm. A suitable skin entry site was selected and marked. Local anesthesia was attained by infiltration with 1% lidocaine following sterile prep and drape with chlorhexidine. A small dermatotomy was made. Under real-time sonographic guidance, multiple 18 gauge core biopsies were obtained using a [employer] mission automated biopsy device. Biopsy specimens were placed in saline and delivered to pathology for further analysis. Post biopsy ultrasound imaging demonstrates no acute complication. IMPRESSION: Ultrasound-guided core biopsy of left cervical lymph node. Electronically Signed   By: Jacqulynn Cadet M.D.   On: 08/12/2017 16:05    ASSESSMENT & PLAN:   72 y.o. female with  1. Left Cervical Lymphadenopathy concerning for likely Hodgkin's Lymphoma (based on needle biopsy) No constitutional symptoms Sed rate elevated to 30 Nl LDH Component     Latest Ref Rng & Units 08/29/2017  Retic Ct Pct     0.7 - 2.1 % 0.8  RBC.     3.70 - 5.45 MIL/uL 4.60  Retic Count, Absolute     33.7 - 90.7 K/uL 36.8  Sed Rate     0 - 22 mm/hr 30 (H)  LDH     125 - 245 U/L 179  HCV Ab     0.0 - 0.9 s/co ratio <0.1  Hepatitis B Surface Ag     Negative Negative  Hep B Core Ab, Tot     Negative Negative   PLAN -obtained a detailed clinical evaluation of the patient. -Discussed with the in details- pathology results from her most recent needle/core biopsy which are highly concerning for Hodgkin's lymphoma but not yet definitively diagnostic.  -Discussed needing to understand the architecture of her lymph nodes to confirm that it is Hodgkin's Lymphoma. -Will refer pt to ENT  urgently for a surgical left cervical LN biopsy, since  pt has given her consent to this. -Blood tests today.- results noted -CT neck results discussed -PET/CT scan to evaluate stage and to guide further diagnostic workup and therapeutic treatment plan   Labs today PET/CT in 5-7 days Urgent ENT referral to [facility]NT for left cervical LN biopsy for likely Hodgkins lymphoma within 1 week (Dr Erik Obey or Dr Constance Holster)  RTC with Dr Irene Limbo in week after Lymph Node biopsy in 2-3 weeks.  All of the patients questions were answered with apparent satisfaction. The patient knows to call the clinic with any problems, questions or concerns.  I spent 40 minutes counseling the patient face to face. The total time spent in the appointment was 50 minutes and more than 50% was on counseling and direct patient cares.    Sullivan Lone MD MS AAHIVMS [facility] Hematology/Oncology Physician [facility]  (Office):       [phone] (Work cell):  [phone] (Fax):           [phone]  08/29/2017 1:42 PM  This document serves as a record of services personally performed by Sullivan Lone, MD. It was created on his behalf by Baldwin Jamaica, a trained medical scribe. The creation of this record is based on the scribe's personal observations and the provider's statements to them.   .I have reviewed the above documentation for accuracy and completeness, and I agree with the above. Brunetta Genera MD MS

## 2017-08-29 ENCOUNTER — Inpatient Hospital Stay: Payer: Medicare HMO | Attending: Hematology | Admitting: Hematology

## 2017-08-29 ENCOUNTER — Inpatient Hospital Stay: Payer: Medicare HMO

## 2017-08-29 ENCOUNTER — Encounter: Payer: Self-pay | Admitting: Hematology

## 2017-08-29 ENCOUNTER — Telehealth: Payer: Self-pay | Admitting: Hematology

## 2017-08-29 DIAGNOSIS — R590 Localized enlarged lymph nodes: Secondary | ICD-10-CM | POA: Insufficient documentation

## 2017-08-29 DIAGNOSIS — C8191 Hodgkin lymphoma, unspecified, lymph nodes of head, face, and neck: Secondary | ICD-10-CM

## 2017-08-29 LAB — CMP (CANCER CENTER ONLY)
ALK PHOS: 96 U/L (ref 40–150)
ALT: 13 U/L (ref 0–55)
AST: 19 U/L (ref 5–34)
Albumin: 4 g/dL (ref 3.5–5.0)
Anion gap: 11 (ref 3–11)
BILIRUBIN TOTAL: 0.3 mg/dL (ref 0.2–1.2)
BUN: 8 mg/dL (ref 7–26)
CALCIUM: 10 mg/dL (ref 8.4–10.4)
CO2: 23 mmol/L (ref 22–29)
CREATININE: 0.88 mg/dL (ref 0.60–1.10)
Chloride: 106 mmol/L (ref 98–109)
GFR, Estimated: >60 mL/min (ref >60)
GLUCOSE: 93 mg/dL (ref 70–140)
Potassium: 3.8 mmol/L (ref 3.5–5.1)
SODIUM: 140 mmol/L (ref 136–145)
Total Protein: 8 g/dL (ref 6.4–8.3)

## 2017-08-29 LAB — CBC WITH DIFFERENTIAL (CANCER CENTER ONLY)
Basophils Absolute: 0.1 10*3/uL (ref 0.0–0.1)
Basophils Relative: 1 %
EOS PCT: 2 %
Eosinophils Absolute: 0.2 10*3/uL (ref 0.0–0.5)
HCT: 39.8 % (ref 34.8–46.6)
Hemoglobin: 12.8 g/dL (ref 11.6–15.9)
LYMPHS ABS: 2.1 10*3/uL (ref 0.9–3.3)
LYMPHS PCT: 25 %
MCH: 27.8 pg (ref 25.1–34.0)
MCHC: 32.2 g/dL (ref 31.5–36.0)
MCV: 86.5 fL (ref 79.5–101.0)
MONO ABS: 0.7 10*3/uL (ref 0.1–0.9)
Monocytes Relative: 8 %
Neutro Abs: 5.4 10*3/uL (ref 1.5–6.5)
Neutrophils Relative %: 64 %
PLATELETS: 351 10*3/uL (ref 145–400)
RBC: 4.6 MIL/uL (ref 3.70–5.45)
RDW: 14.1 % (ref 11.2–14.5)
WBC Count: 8.4 10*3/uL (ref 3.9–10.3)

## 2017-08-29 LAB — RETICULOCYTES
RBC.: 4.6 MIL/uL (ref 3.70–5.45)
Retic Count, Absolute: 36.8 10*3/uL (ref 33.7–90.7)
Retic Ct Pct: 0.8 % (ref 0.7–2.1)

## 2017-08-29 LAB — SEDIMENTATION RATE: Sed Rate: 30 mm/hr — ABNORMAL HIGH (ref 0–22)

## 2017-08-29 LAB — LACTATE DEHYDROGENASE: LDH: 179 U/L (ref 125–245)

## 2017-08-29 NOTE — Telephone Encounter (Signed)
Gave patient AVs and calendar of upcoming April appointments.  °

## 2017-08-29 NOTE — Patient Instructions (Signed)
Thank you for choosing [facility] to provide your oncology and hematology care.  To afford each patient quality time with our providers, please arrive 30 minutes before your scheduled appointment time.  If you arrive late for your appointment, you may be asked to reschedule.  We strive to give you quality time with our providers, and arriving late affects you and other patients whose appointments are after yours.   If you are a no show for multiple scheduled visits, you may be dismissed from the clinic at the providers discretion.    Again, thank you for choosing [facility], our hope is that these requests will decrease the amount of time that you wait before being seen by our physicians.  ______________________________________________________________________  Should you have questions after your visit to the [facility], please contact our office at [phone] between the hours of 8:30 and 4:30 p.m.    Voicemails left after 4:30p.m will not be returned until the following business day.    For prescription refill requests, please have your pharmacy contact us directly.  Please also try to allow 48 hours for prescription requests.    Please contact the scheduling department for questions regarding scheduling.  For scheduling of procedures such as PET scans, CT scans, MRI, Ultrasound, etc please contact central scheduling at [phone].    Resources For Cancer Patients and Caregivers:   Oncolink.org:  A wonderful resource for patients and healthcare providers for information regarding your disease, ways to tract your treatment, what to expect, etc.     American Cancer Society:  800-227-2345  Can help patients locate various types of support and financial assistance  Cancer Care: 1-800-813-HOPE (4673) Provides financial assistance, online support groups, medication/co-pay assistance.    [place] DSS:  336-641-3447 Where to apply for food  stamps, Medicaid, and utility assistance  Medicare Rights Center: 800-333-4114 Helps people with Medicare understand their rights and benefits, navigate the Medicare system, and secure the quality healthcare they deserve  SCAT: 336-333-6589 [employer] [employer]'s shared-ride transportation service for eligible riders who have a disability that prevents them from riding the fixed route bus.    For additional information on assistance programs please contact our social worker:   Grier Hock/Abigail Elmore:  [phone]            

## 2017-08-30 LAB — HEPATITIS B CORE ANTIBODY, TOTAL: HEP B C TOTAL AB: NEGATIVE

## 2017-08-30 LAB — HEPATITIS C ANTIBODY

## 2017-08-30 LAB — HEPATITIS B SURFACE ANTIGEN: Hepatitis B Surface Ag: NEGATIVE

## 2017-09-01 ENCOUNTER — Telehealth: Payer: Self-pay

## 2017-09-01 NOTE — Telephone Encounter (Deleted)
Urgent ENT referral to [facility] ENT for left cervical LN biopsy for likely Hodgkins lymphoma within 1 week (Dr Erik Obey or Dr Constance Holster)

## 2017-09-01 NOTE — Telephone Encounter (Signed)
Confirming urgent ENT referral for left cervical lymphnode biopsy to determine if hodgkins lymphoma present. Pt has appointment with [facility] on Thursday. Pt aware of appointment.

## 2017-09-04 DIAGNOSIS — R590 Localized enlarged lymph nodes: Secondary | ICD-10-CM | POA: Diagnosis not present

## 2017-09-05 ENCOUNTER — Telehealth: Payer: Self-pay

## 2017-09-05 NOTE — Telephone Encounter (Signed)
Pt called concerned about having PET scan completed. She verbalized, "I am just really anxious about the scan." I asked the pt to describe why she was concerned about having the scan completed. She responded, "I read somewhere online that you have to not eat carbs or sugar for 24 hours prior to the scan." I explained that typically there was a period of not being able to eat or drink anything, but not what she was describing. She said, "I guess I shouldn't be reading things online and getting myself worked up, but there really isn't a reason for me to get the PET scan until we have the results from the biopsy, right?" I explained that since the f/u with Dr. Irene Limbo is on 4/5, it would be best to go ahead and have the scan completed for review. Pt given the number for Central Scheduling [phone]. Pt said, "Okay. I will go ahead and figure out when to schedule this scan.

## 2017-09-11 NOTE — H&P (Signed)
  HPI:   Jennifer Juarez is a 72 y.o. female who presents as a consult Patient.   Referring Provider: Sullivan Lone, MD  Chief complaint: Cervical lymph node.  HPI: Previously healthy lady noticed a lump in her left neck couple of months ago. She has had workup on this including CT scan and core needle biopsy. Both are suspicious for non-Hodgkin's lymphoma. Request was made for her to have an excisional biopsy for tissue typing.  PMH/Meds/All/SocHx/FamHx/ROS:   Past Medical History:  Diagnosis Date  . Allergy  . Arthritis   Past Surgical History:  Procedure Laterality Date  . CATARACT EXTRACTION  . CHOLECYSTECTOMY  . TUBAL LIGATION   No family history of bleeding disorders, wound healing problems or difficulty with anesthesia.   Social History   Social History  . Marital status: Unknown  Spouse name: N/A  . Number of children: N/A  . Years of education: N/A   Occupational History  . Not on file.   Social History Main Topics  . Smoking status: Former Research scientist (life sciences)  . Smokeless tobacco: Never Used  . Alcohol use Not on file  . Drug use: Unknown  . Sexual activity: Not on file   Other Topics Concern  . Not on file   Social History Narrative  . No narrative on file   Current Outpatient Prescriptions:  . acetaminophen (TYLENOL) 500 MG tablet, Take 500 mg by mouth., Disp: , Rfl:  . multivit-minerals/ferrous fum (MULTI VITAMIN ORAL), Take 2 each by mouth., Disp: , Rfl:  . triamcinolone (NASACORT) 55 mcg nasal inhaler, 2 sprays by Nasal route., Disp: , Rfl:   A complete ROS was performed with pertinent positives/negatives noted in the HPI. The remainder of the ROS are negative.   Physical Exam:   Ht 1.727 m (5\' 8" )  Wt 96.6 kg (213 lb)  BMI 32.39 kg/m   General: Healthy and alert, in no distress, breathing easily. Normal affect. In a pleasant mood. Head: Normocephalic, atraumatic. No masses, or scars. Eyes: Pupils are equal, and reactive to light. Vision is grossly  intact. No spontaneous or gaze nystagmus. Ears: Ear canals are clear. Tympanic membranes are intact, with normal landmarks and the middle ears are clear and healthy. Hearing: Grossly normal. Nose: Nasal cavities are clear with healthy mucosa, no polyps or exudate. Airways are patent. Face: No masses or scars, facial nerve function is symmetric. Oral Cavity: No mucosal abnormalities are noted. Tongue with normal mobility. Dentition appears healthy. Oropharynx: Tonsils are symmetric. There are no mucosal masses identified. Tongue base appears normal and healthy. Larynx/Hypopharynx: deferred Chest: Deferred Neck: Left level 2 node, approximately 4 cm. No other adenopathy, no thyroid nodules or enlargement. Neuro: Cranial nerves II-XII with normal function. Balance: Normal gate. Other findings: none.  Independent Review of Additional Tests or Records:  none  Procedures:  none  Impression & Plans:  Cervical lymphadenopathy suspicious for lymphoma. Recommend excisional biopsy under general anesthesia. Risks and benefits were discussed in detail. All questions were answered.

## 2017-09-16 ENCOUNTER — Other Ambulatory Visit: Payer: Self-pay | Admitting: Hematology

## 2017-09-16 ENCOUNTER — Telehealth: Payer: Self-pay

## 2017-09-16 MED ORDER — LORAZEPAM 0.5 MG PO TABS: 0.5000 mg | ORAL_TABLET | Freq: Three times a day (TID) | ORAL | 0 refills | Status: DC | PRN

## 2017-09-16 NOTE — Telephone Encounter (Signed)
Pt called requesting medication for anxiety r/t potential claustrophobia tomorrow during PET scan. Ativan ordered by MD and sent to pt preferred pharmacy at [facility] in [place], [place]. Called pt to confirm medication sent and encouraged pt to have a driver. Ativan can be taken 43min-1hr prior to scan. Pt verbalized understanding and plan to have a driver in the event that she is unable to drive with side effects of ativan.

## 2017-09-17 ENCOUNTER — Encounter (HOSPITAL_BASED_OUTPATIENT_CLINIC_OR_DEPARTMENT_OTHER): Payer: Self-pay | Admitting: *Deleted

## 2017-09-17 ENCOUNTER — Encounter (HOSPITAL_COMMUNITY)
Admission: RE | Admit: 2017-09-17 | Discharge: 2017-09-17 | Disposition: A | Discharge status: Home / Self Care | Payer: Medicare HMO | Source: Ambulatory Visit | Attending: Hematology | Admitting: Hematology

## 2017-09-17 ENCOUNTER — Other Ambulatory Visit: Payer: Self-pay

## 2017-09-17 DIAGNOSIS — C8191 Hodgkin lymphoma, unspecified, lymph nodes of head, face, and neck: Secondary | ICD-10-CM | POA: Diagnosis present

## 2017-09-17 DIAGNOSIS — C8190 Hodgkin lymphoma, unspecified, unspecified site: Secondary | ICD-10-CM | POA: Diagnosis not present

## 2017-09-17 LAB — GLUCOSE, CAPILLARY: GLUCOSE-CAPILLARY: 96 mg/dL (ref 65–99)

## 2017-09-17 MED ORDER — FLUDEOXYGLUCOSE F - 18 (FDG) INJECTION
10.6000 | Freq: Once | INTRAVENOUS | Status: AC | PRN
  Administered 2017-09-17: 10.6 via INTRAVENOUS

## 2017-09-22 ENCOUNTER — Ambulatory Visit (HOSPITAL_BASED_OUTPATIENT_CLINIC_OR_DEPARTMENT_OTHER): Payer: Medicare HMO | Admitting: Anesthesiology

## 2017-09-22 ENCOUNTER — Encounter (HOSPITAL_BASED_OUTPATIENT_CLINIC_OR_DEPARTMENT_OTHER)
Admission: RE | Disposition: A | Discharge status: Home / Self Care | Payer: Self-pay | Source: Ambulatory Visit | Attending: Otolaryngology

## 2017-09-22 ENCOUNTER — Encounter (HOSPITAL_BASED_OUTPATIENT_CLINIC_OR_DEPARTMENT_OTHER): Payer: Self-pay | Admitting: *Deleted

## 2017-09-22 ENCOUNTER — Other Ambulatory Visit: Payer: Self-pay

## 2017-09-22 ENCOUNTER — Ambulatory Visit (HOSPITAL_BASED_OUTPATIENT_CLINIC_OR_DEPARTMENT_OTHER)
Admission: RE | Admit: 2017-09-22 | Discharge: 2017-09-22 | Disposition: A | Discharge status: Home / Self Care | Payer: Medicare HMO | Source: Ambulatory Visit | Attending: Otolaryngology | Admitting: Otolaryngology

## 2017-09-22 DIAGNOSIS — Z79899 Other long term (current) drug therapy: Secondary | ICD-10-CM | POA: Diagnosis not present

## 2017-09-22 DIAGNOSIS — C8121 Mixed cellularity classical Hodgkin lymphoma, lymph nodes of head, face, and neck: Secondary | ICD-10-CM | POA: Diagnosis not present

## 2017-09-22 DIAGNOSIS — Z7951 Long term (current) use of inhaled steroids: Secondary | ICD-10-CM | POA: Insufficient documentation

## 2017-09-22 DIAGNOSIS — C8191 Hodgkin lymphoma, unspecified, lymph nodes of head, face, and neck: Secondary | ICD-10-CM | POA: Diagnosis not present

## 2017-09-22 DIAGNOSIS — Z87891 Personal history of nicotine dependence: Secondary | ICD-10-CM | POA: Insufficient documentation

## 2017-09-22 DIAGNOSIS — C8171 Other classical Hodgkin lymphoma, lymph nodes of head, face, and neck: Secondary | ICD-10-CM | POA: Insufficient documentation

## 2017-09-22 DIAGNOSIS — R590 Localized enlarged lymph nodes: Secondary | ICD-10-CM | POA: Diagnosis not present

## 2017-09-22 HISTORY — DX: Localized enlarged lymph nodes: R59.0

## 2017-09-22 HISTORY — PX: MASS EXCISION: SHX2000

## 2017-09-22 HISTORY — DX: Allergy status to unspecified drugs, medicaments and biological substances: Z88.9

## 2017-09-22 SURGERY — EXCISION MASS
Anesthesia: General | Site: Neck | Laterality: Left

## 2017-09-22 MED ORDER — FENTANYL CITRATE (PF) 100 MCG/2ML IJ SOLN
25.0000 ug | INTRAMUSCULAR | Status: DC | PRN
  Administered 2017-09-22 (×2): 25 ug via INTRAVENOUS

## 2017-09-22 MED ORDER — PROPOFOL 10 MG/ML IV BOLUS
INTRAVENOUS | Status: AC
  Filled 2017-09-22: qty 20

## 2017-09-22 MED ORDER — DEXAMETHASONE SODIUM PHOSPHATE 4 MG/ML IJ SOLN
INTRAMUSCULAR | Status: DC | PRN
  Administered 2017-09-22: 10 mg via INTRAVENOUS

## 2017-09-22 MED ORDER — PROPOFOL 10 MG/ML IV BOLUS
INTRAVENOUS | Status: DC | PRN
  Administered 2017-09-22: 130 mg via INTRAVENOUS

## 2017-09-22 MED ORDER — FENTANYL CITRATE (PF) 100 MCG/2ML IJ SOLN
INTRAMUSCULAR | Status: AC
  Filled 2017-09-22: qty 2

## 2017-09-22 MED ORDER — PROMETHAZINE HCL 25 MG RE SUPP: 25.0000 mg | Freq: Four times a day (QID) | RECTAL | 1 refills | Status: DC | PRN

## 2017-09-22 MED ORDER — CEPHALEXIN 500 MG PO CAPS: 500.0000 mg | ORAL_CAPSULE | Freq: Three times a day (TID) | ORAL | 0 refills | Status: DC

## 2017-09-22 MED ORDER — ONDANSETRON HCL 4 MG/2ML IJ SOLN
INTRAMUSCULAR | Status: AC
  Filled 2017-09-22: qty 2

## 2017-09-22 MED ORDER — EPHEDRINE SULFATE 50 MG/ML IJ SOLN
INTRAMUSCULAR | Status: DC | PRN
  Administered 2017-09-22 (×2): 10 mg via INTRAVENOUS

## 2017-09-22 MED ORDER — ONDANSETRON HCL 4 MG/2ML IJ SOLN: 4.0000 mg | Freq: Once | INTRAMUSCULAR | Status: DC | PRN

## 2017-09-22 MED ORDER — MIDAZOLAM HCL 2 MG/2ML IJ SOLN: 1.0000 mg | INTRAMUSCULAR | Status: DC | PRN

## 2017-09-22 MED ORDER — LACTATED RINGERS IV SOLN
INTRAVENOUS | Status: DC
  Administered 2017-09-22 (×2): via INTRAVENOUS

## 2017-09-22 MED ORDER — HYDROCODONE-ACETAMINOPHEN 7.5-325 MG PO TABS: 1.0000 | ORAL_TABLET | Freq: Four times a day (QID) | ORAL | 0 refills | Status: DC | PRN

## 2017-09-22 MED ORDER — FENTANYL CITRATE (PF) 100 MCG/2ML IJ SOLN
50.0000 ug | INTRAMUSCULAR | Status: DC | PRN
  Administered 2017-09-22 (×2): 50 ug via INTRAVENOUS

## 2017-09-22 MED ORDER — LIDOCAINE HCL (CARDIAC) 20 MG/ML IV SOLN
INTRAVENOUS | Status: AC
Start: 2017-09-22 — End: 2017-09-22
  Filled 2017-09-22: qty 5

## 2017-09-22 MED ORDER — DEXAMETHASONE SODIUM PHOSPHATE 10 MG/ML IJ SOLN
INTRAMUSCULAR | Status: AC
  Filled 2017-09-22: qty 1

## 2017-09-22 MED ORDER — LIDOCAINE HCL (CARDIAC) 20 MG/ML IV SOLN
INTRAVENOUS | Status: DC | PRN
  Administered 2017-09-22: 100 mg via INTRAVENOUS

## 2017-09-22 MED ORDER — SCOPOLAMINE 1 MG/3DAYS TD PT72: 1.0000 | MEDICATED_PATCH | Freq: Once | TRANSDERMAL | Status: DC | PRN

## 2017-09-22 SURGICAL SUPPLY — 62 items
ADH SKN CLS APL DERMABOND .7 (GAUZE/BANDAGES/DRESSINGS) ×1
APL SKNCLS STERI-STRIP NONHPOA (GAUZE/BANDAGES/DRESSINGS)
ATTRACTOMAT 16X20 MAGNETIC DRP (DRAPES) IMPLANT
BENZOIN TINCTURE PRP APPL 2/3 (GAUZE/BANDAGES/DRESSINGS) IMPLANT
BLADE SURG 15 STRL LF DISP TIS (BLADE) ×1 IMPLANT
BLADE SURG 15 STRL SS (BLADE) ×3
CANISTER SUCT 1200ML W/VALVE (MISCELLANEOUS) ×3 IMPLANT
CLEANER CAUTERY TIP 5X5 PAD (MISCELLANEOUS) ×1 IMPLANT
CLIP VESOCCLUDE MED 6/CT (CLIP) IMPLANT
CLIP VESOCCLUDE SM WIDE 6/CT (CLIP) IMPLANT
CLOSURE WOUND 1/2 X4 (GAUZE/BANDAGES/DRESSINGS)
CORD BIPOLAR FORCEPS 12FT (ELECTRODE) IMPLANT
COVER BACK TABLE 60X90IN (DRAPES) ×3 IMPLANT
COVER MAYO STAND STRL (DRAPES) ×3 IMPLANT
COVER SURGICAL LIGHT HANDLE (MISCELLANEOUS) ×2 IMPLANT
DERMABOND ADVANCED (GAUZE/BANDAGES/DRESSINGS) ×2
DERMABOND ADVANCED .7 DNX12 (GAUZE/BANDAGES/DRESSINGS) IMPLANT
DRAIN JACKSON RD 7FR 3/32 (WOUND CARE) IMPLANT
DRAIN PENROSE 1/4X12 LTX STRL (WOUND CARE) ×2 IMPLANT
DRAPE U-SHAPE 76X120 STRL (DRAPES) ×3 IMPLANT
ELECT COATED BLADE 2.86 ST (ELECTRODE) ×3 IMPLANT
ELECT REM PT RETURN 9FT ADLT (ELECTROSURGICAL) ×3
ELECTRODE REM PT RTRN 9FT ADLT (ELECTROSURGICAL) ×1 IMPLANT
EVACUATOR SILICONE 100CC (DRAIN) IMPLANT
GAUZE SPONGE 4X4 12PLY STRL LF (GAUZE/BANDAGES/DRESSINGS) IMPLANT
GAUZE SPONGE 4X4 16PLY XRAY LF (GAUZE/BANDAGES/DRESSINGS) IMPLANT
GLOVE BIOGEL PI IND STRL 7.5 (GLOVE) IMPLANT
GLOVE BIOGEL PI INDICATOR 7.5 (GLOVE) ×4
GLOVE ECLIPSE 7.5 STRL STRAW (GLOVE) ×3 IMPLANT
GLOVE SURG SYN 7.5  E (GLOVE) ×2
GLOVE SURG SYN 7.5 E (GLOVE) ×1 IMPLANT
GLOVE SURG SYN 7.5 PF PI (GLOVE) IMPLANT
GOWN STRL REUS W/ TWL LRG LVL3 (GOWN DISPOSABLE) ×1 IMPLANT
GOWN STRL REUS W/ TWL XL LVL3 (GOWN DISPOSABLE) ×1 IMPLANT
GOWN STRL REUS W/TWL LRG LVL3 (GOWN DISPOSABLE) ×3
GOWN STRL REUS W/TWL XL LVL3 (GOWN DISPOSABLE) ×3
NDL PRECISIONGLIDE 27X1.5 (NEEDLE) IMPLANT
NEEDLE PRECISIONGLIDE 27X1.5 (NEEDLE) ×3 IMPLANT
NS IRRIG 1000ML POUR BTL (IV SOLUTION) ×2 IMPLANT
PACK BASIN DAY SURGERY FS (CUSTOM PROCEDURE TRAY) ×3 IMPLANT
PAD CLEANER CAUTERY TIP 5X5 (MISCELLANEOUS) ×2
PENCIL FOOT CONTROL (ELECTRODE) ×3 IMPLANT
RUBBERBAND STERILE (MISCELLANEOUS) IMPLANT
SPONGE GAUZE 2X2 8PLY STER LF (GAUZE/BANDAGES/DRESSINGS)
SPONGE GAUZE 2X2 8PLY STRL LF (GAUZE/BANDAGES/DRESSINGS) IMPLANT
STRIP CLOSURE SKIN 1/2X4 (GAUZE/BANDAGES/DRESSINGS) IMPLANT
SUCTION FRAZIER HANDLE 10FR (MISCELLANEOUS)
SUCTION TUBE FRAZIER 10FR DISP (MISCELLANEOUS) IMPLANT
SUT CHROMIC 3 0 PS 2 (SUTURE) ×2 IMPLANT
SUT CHROMIC 4 0 P 3 18 (SUTURE) IMPLANT
SUT ETHILON 4 0 PS 2 18 (SUTURE) IMPLANT
SUT ETHILON 5 0 P 3 18 (SUTURE)
SUT NYLON ETHILON 5-0 P-3 1X18 (SUTURE) IMPLANT
SUT PLAIN 5 0 P 3 18 (SUTURE) IMPLANT
SUT SILK 4 0 TIES 17X18 (SUTURE) ×2 IMPLANT
SUT VICRYL 4-0 PS2 18IN ABS (SUTURE) IMPLANT
SYR BULB 3OZ (MISCELLANEOUS) ×2 IMPLANT
SYR CONTROL 10ML LL (SYRINGE) ×3 IMPLANT
TOWEL OR 17X24 6PK STRL BLUE (TOWEL DISPOSABLE) ×3 IMPLANT
TRAY DSU PREP LF (CUSTOM PROCEDURE TRAY) ×3 IMPLANT
TUBE CONNECTING 20'X1/4 (TUBING) ×1
TUBE CONNECTING 20X1/4 (TUBING) ×2 IMPLANT

## 2017-09-22 NOTE — Anesthesia Preprocedure Evaluation (Signed)
Anesthesia Evaluation  Patient identified by MRN, date of birth, ID band Patient awake    Reviewed: Allergy & Precautions, NPO status , Patient's Chart, lab work & pertinent test results  Airway Mallampati: II  TM Distance: <3 FB Neck ROM: Full    Dental  (+) Dental Advisory Given, Partial Upper, Partial Lower   Pulmonary former smoker,    Pulmonary exam normal breath sounds clear to auscultation       Cardiovascular Exercise Tolerance: Good negative cardio ROS Normal cardiovascular exam Rhythm:Regular Rate:Normal     Neuro/Psych negative neurological ROS  negative psych ROS   GI/Hepatic negative GI ROS, Neg liver ROS,   Endo/Other  negative endocrine ROS  Renal/GU negative Renal ROS     Musculoskeletal negative musculoskeletal ROS (+)   Abdominal   Peds  Hematology negative hematology ROS (+)   Anesthesia Other Findings Day of surgery medications reviewed with the patient.  Reproductive/Obstetrics                             Anesthesia Physical Anesthesia Plan  ASA: II  Anesthesia Plan: General   Post-op Pain Management:    Induction: Intravenous  PONV Risk Score and Plan: 4 or greater and Dexamethasone, Ondansetron and Treatment may vary due to age or medical condition  Airway Management Planned: LMA  Additional Equipment:   Intra-op Plan:   Post-operative Plan: Extubation in OR  Informed Consent: I have reviewed the patients History and Physical, chart, labs and discussed the procedure including the risks, benefits and alternatives for the proposed anesthesia with the patient or authorized representative who has indicated his/her understanding and acceptance.   Dental advisory given  Plan Discussed with: CRNA  Anesthesia Plan Comments:         Anesthesia Quick Evaluation

## 2017-09-22 NOTE — Interval H&P Note (Signed)
History and Physical Interval Note:  09/22/2017 9:01 AM  Jennifer Juarez  has presented today for surgery, with the diagnosis of cervical lymphadopathy  The various methods of treatment have been discussed with the patient and family. After consideration of risks, benefits and other options for treatment, the patient has consented to  Procedure(s): LEFT CERVICAL LYMPH NODE EXCISION (Left) as a surgical intervention .  The patient's history has been reviewed, patient examined, no change in status, stable for surgery.  I have reviewed the patient's chart and labs.  Questions were answered to the patient's satisfaction.     Izora Gala

## 2017-09-22 NOTE — Discharge Instructions (Signed)
Keep the dressing in place, replace or add additional if necessary.  Keep the wound dry.     Post Anesthesia Home Care Instructions  Activity: Get plenty of rest for the remainder of the day. A responsible individual must stay with you for 24 hours following the procedure.  For the next 24 hours, DO NOT: -Drive a car -Paediatric nurse -Drink alcoholic beverages -Take any medication unless instructed by your physician -Make any legal decisions or sign important papers.  Meals: Start with liquid foods such as gelatin or soup. Progress to regular foods as tolerated. Avoid greasy, spicy, heavy foods. If nausea and/or vomiting occur, drink only clear liquids until the nausea and/or vomiting subsides. Call your physician if vomiting continues.  Special Instructions/Symptoms: Your throat may feel dry or sore from the anesthesia or the breathing tube placed in your throat during surgery. If this causes discomfort, gargle with warm salt water. The discomfort should disappear within 24 hours.  If you had a scopolamine patch placed behind your ear for the management of post- operative nausea and/or vomiting:  1. The medication in the patch is effective for 72 hours, after which it should be removed.  Wrap patch in a tissue and discard in the trash. Wash hands thoroughly with soap and water. 2. You may remove the patch earlier than 72 hours if you experience unpleasant side effects which may include dry mouth, dizziness or visual disturbances. 3. Avoid touching the patch. Wash your hands with soap and water after contact with the patch.

## 2017-09-22 NOTE — Transfer of Care (Signed)
Immediate Anesthesia Transfer of Care Note  Patient: Jennifer Juarez  Procedure(s) Performed: LEFT CERVICAL LYMPH NODE EXCISION (Left Neck)  Patient Location: PACU  Anesthesia Type:General  Level of Consciousness: sedated  Airway & Oxygen Therapy: Patient Spontanous Breathing and Patient connected to face mask oxygen  Post-op Assessment: Report given to RN and Post -op Vital signs reviewed and stable  Post vital signs: Reviewed and stable  Last Vitals:  Vitals Value Taken Time  BP 132/59 09/22/2017 10:20 AM  Temp    Pulse 94 09/22/2017 10:22 AM  Resp 17 09/22/2017 10:22 AM  SpO2 96 % 09/22/2017 10:22 AM  Vitals shown include unvalidated device data.  Last Pain:  Vitals:   09/22/17 0803  TempSrc: Oral  PainSc: 0-No pain         Complications: No apparent anesthesia complications

## 2017-09-22 NOTE — Op Note (Signed)
OPERATIVE REPORT  DATE OF SURGERY: 09/22/2017  PATIENT:  Jennifer Juarez,  72 y.o. female  PRE-OPERATIVE DIAGNOSIS:  cervical lymphadopathy  POST-OPERATIVE DIAGNOSIS:  cervical lymphadopathy  PROCEDURE:  Procedure(s): LEFT CERVICAL LYMPH NODE EXCISION  SURGEON:  Beckie Salts, MD  ASSISTANTS: None  ANESTHESIA:   General   EBL: 20 ml  DRAINS: Quarter-inch Penrose  LOCAL MEDICATIONS USED:  None  SPECIMEN: Left level 2 lymph node, sent fresh for pathologic evaluation and lymphoma workup  COUNTS:  Correct  PROCEDURE DETAILS: The patient was taken to the operating room and placed on the operating table in the supine position. Following induction of general endotracheal the left neck was prepped and draped in a standard fashion.  The mass was identified anesthesia, I palpation.  A marking pen was used to mark the incision along a skin crease about 3 fingerbreadths below the angle of the mandible.  Electrocautery was used to incise the skin and subcutaneous tissue.  A self-retaining retractor was used throughout the case.  Blunt dissection between the fibers of the platysma were accomplished and exposed the lymph node.  The node was dissected free of surrounding tissue.  It actually appeared to be multiple lymph nodes matted together.  Portions were removed in a piecemeal fashion.  In aggregate of approximately 2 x 3 x 1 cm was removed and sent for pathologic evaluation.  A single 4-0 silk tie was used for hemostasis.  Wound was irrigated with saline.  Penrose was left in the depths of the wound exited through the anterior aspect of the incision and secured in place with a single chromic suture.  A subcuticular closure with running 3-0 chromic was accomplished and Dermabond was used on the skin.  A dressing was applied.  Patient was awakened extubated and transferred to recovery in stable condition.    PATIENT DISPOSITION:  To PACU, stable

## 2017-09-22 NOTE — Anesthesia Procedure Notes (Signed)
Procedure Name: LMA Insertion Date/Time: 09/22/2017 9:32 AM Performed by: Maryella Shivers, CRNA Pre-anesthesia Checklist: Patient identified, Emergency Drugs available, Suction available and Patient being monitored Patient Re-evaluated:Patient Re-evaluated prior to induction Oxygen Delivery Method: Circle system utilized Preoxygenation: Pre-oxygenation with 100% oxygen Induction Type: IV induction Ventilation: Mask ventilation without difficulty LMA: LMA inserted LMA Size: 4.0 Number of attempts: 1 Airway Equipment and Method: Bite block Placement Confirmation: positive ETCO2 Tube secured with: Tape Dental Injury: Teeth and Oropharynx as per pre-operative assessment

## 2017-09-22 NOTE — Progress Notes (Signed)
Pt left without her RX's (hudrocodone, Keflex, phenergan). Called cell phone and asked her to come back for them. Pt verbalized understanding and says she and nephew (driver) are on their way back.

## 2017-09-23 ENCOUNTER — Encounter (HOSPITAL_BASED_OUTPATIENT_CLINIC_OR_DEPARTMENT_OTHER): Payer: Self-pay | Admitting: Otolaryngology

## 2017-09-23 NOTE — Anesthesia Postprocedure Evaluation (Signed)
Anesthesia Post Note  Patient: Jennifer Juarez  Procedure(s) Performed: LEFT CERVICAL LYMPH NODE EXCISION (Left Neck)     Patient location during evaluation: PACU Anesthesia Type: General Level of consciousness: awake and alert Pain management: pain level controlled Vital Signs Assessment: post-procedure vital signs reviewed and stable Respiratory status: spontaneous breathing, nonlabored ventilation and respiratory function stable Cardiovascular status: blood pressure returned to baseline and stable Postop Assessment: no apparent nausea or vomiting Anesthetic complications: no    Last Vitals:  Vitals:   09/22/17 1115 09/22/17 1215  BP: (!) 143/75 (!) 152/76  Pulse: 80 78  Resp: 19 16  Temp:  36.4 C  SpO2: 94% 98%    Last Pain:  Vitals:   09/22/17 1215  TempSrc:   PainSc: 0-No pain   Pain Goal:                 Catalina Gravel

## 2017-09-25 ENCOUNTER — Telehealth: Payer: Self-pay

## 2017-09-25 NOTE — Telephone Encounter (Signed)
Discussed appt tomorrow with pt. Willing to come in as scheduled to review PET scan and biopsy results. Pt had reaction on Monday to antibiotic. Dr. Constance Holster, prescriber, notified by pt of reaction to keflex resulting in swelling of throat and tongue. Pt took benadryl and symptoms resolved. Pt stated, "Had symptoms worsened any more I would have called 911 because it could have impeded my ability to breathe." Cephalexin added to allergy list and per pt levothyroxine incorrect allergy. Pt had reaction to levofloxacin (antibiotic). This was changed in patient allergy list. Pt verbalized thanks for the communication.

## 2017-09-25 NOTE — Progress Notes (Signed)
HEMATOLOGY/ONCOLOGY CLINIC NOTE  Date of Service: 09/26/17  Patient Care Team: Kathyrn Lass, MD as PCP - General (Family Medicine)  CHIEF COMPLAINTS/PURPOSE OF CONSULTATION:  F/u for newly diagnosed Hodgkin's Lymphoma   HISTORY OF PRESENTING ILLNESS:   Jennifer Juarez is a wonderful 72 y.o. female who has been referred to [place] by Dr. Lattie Haw Miller/Courtney Rolland Porter PA_C  for evaluation and management of likely newly diagnosed Hodgkin's Lymphoma.   The pt reports that she is doing very well overall and notes that she is fully functional and does not have any significant chronic medical problems or any functional limitations.  She notes that on 06/26/17 she noticed a knot on the left side of her neck that subsequently precipitated her CT scan and Bx prior to seeing [place] today.   On 07/28/17 the pt had a CT Soft Tissue Neck revealing Enlarged lymph nodes in the left neck compatible with neoplasm.Enlarged lymph nodes in the left neck. Large left level 2 lymph node 23 x 30 mm. Multiple posterior lymph nodes are present measuring up to 10 mm. No enlarged lymph nodes in the right neck. Biopsy recommended. Attention left tonsil on direct mucosal inspection. Possible tonsillar carcinoma on the left. Lymphoma also in the differential.    Of note prior to the patient's visit, pt has had a Lymph node Needle/Core biopsy completed on 08/12/17 with results revealing ATYPICAL LYMPHOID PROLIFERATION. Immunohistochemical stains were performed including LCA, CD20, PAX-5, CD3, CD15, and CD30 with appropriate controls. The large atypical lymphoid-appearing cells are positive for CD30, CD15 and PAX-5 and negative for CD3, CD20 and LCA. The lymphocytic population in the background show a mixture of T and B-cells with predominance of T-cells. The overall findings are limited but atypical and worrisome for a lymphoproliferative process, particularly. classical Hodgkin lymphoma. Excisional biopsy is strongly  recommended.  Also on 08/12/17 the pt had a Tissue Flow Cytometry revealing NO MONOCLONAL B-CELL POPULATION OR ABNORMAL T-CELL PHENOTYPE IDENTIFIED.   Most recent CBC with Diff. results (07/15/17) revealed all values WNL.   She notes that she has had a viral infection in 2005 that resulted in a complete loss of hearing in her right ear. She notes that her left ear has had fluid build up and has resulted in significant loss of hearing, for which she uses hearing aids.  On review of systems, pt reports good energy levels, and denies fevers, chills, night sweats, unexpected weight loss, skin itching, rashes, soreness, sore throat, difficulties swallowing, back pains, abdominal pains, leg swelling, and any other symptoms.   On PMHx the pt reports arthritis, vertigo, polyps. She denies heart, lung, kidney or liver problems. She denies DM.  On Social Hx the pt notes having quit smoking when she was 51. She reports infrequent EOH consumption. She denies chemical or radiation exposure.  On Family Hx she reports that her mother had liver cancer. Her sister had breast cancer at 72 y/o and pancreatic cancer at 74 y/o.  Interval History:  Jennifer Juarez returns today regarding her newly diagnosed classical Hodgkin's Lymphoma. The patient's last visit with [place] was on 08/29/17. She is accompanied today by her nephew.   The pt reports that she is doing well overall and denies any constitutional symptoms at this time.   She notes that she has been having some dental issues and is hoping to get a few teeth pulled next week. She notes that she would be able to wait on this for a few months if necessary,  but will visit her dentist within the next week to evaluate her options.   Of note since the patient's last visit, pt has had a PET completed on 09/17/17 with results revealing Mild left cervical level 2 hypermetabolic lymphadenopathy. 1.9 cm hypermetabolic peritoneal soft tissue nodule along the inferior margin of  the right hepatic lobe. 4.5 cm descending thoracic aortic aneurysm, mildly increased from 4.2 cm in 2007. Recommend continued followup by chest CT in 1 year.  On 09/22/17 the pt also had the enlarged left cervical lymph node excised with Dr. Hoy Morn.   Her 09/22/17 Lymph Node Pathology revealed CLASSICAL HODGKIN LYMPHOMA.   Lab results today (09/26/17) of CBC, CMP, and Reticulocytes is as follows: all values are WNL. Sed Rate 09/26/17 was slightly elevated at 25.  On review of systems, pt reports feeling well, and denies fevers, chills, night sweats, weight loss, and any other symptoms.   MEDICAL HISTORY:  Past Medical History:  Diagnosis Date  . BPPV (benign paroxysmal positional vertigo)   . H/O seasonal allergies   . Hearing loss in right ear   . Left cervical lymphadenopathy   . Macular degeneration   . Obesity   . Osteopenia   . Retinal vein occlusion   . Vitamin D deficiency     SURGICAL HISTORY: Past Surgical History:  Procedure Laterality Date  . CHOLECYSTECTOMY    . MASS EXCISION Left 09/22/2017   Procedure: LEFT CERVICAL LYMPH NODE EXCISION;  Surgeon: Izora Gala, MD;  Location: [facility];  Service: ENT;  Laterality: Left;  . TUBAL LIGATION      SOCIAL HISTORY: Social History   Socioeconomic History  . Marital status: Single    Spouse name: Not on file  . Number of children: Not on file  . Years of education: Not on file  . Highest education level: Not on file  Occupational History  . Not on file  Social Needs  . Financial resource strain: Not on file  . Food insecurity:    Worry: Not on file    Inability: Not on file  . Transportation needs:    Medical: Not on file    Non-medical: Not on file  Tobacco Use  . Smoking status: Former Smoker    Last attempt to quit: 06/24/1988    Years since quitting: 29.2  . Smokeless tobacco: Never Used  Substance and Sexual Activity  . Alcohol use: No  . Drug use: No  . Sexual activity: Not on file   Lifestyle  . Physical activity:    Days per week: Not on file    Minutes per session: Not on file  . Stress: Not on file  Relationships  . Social connections:    Talks on phone: Not on file    Gets together: Not on file    Attends religious service: Not on file    Active member of club or organization: Not on file    Attends meetings of clubs or organizations: Not on file    Relationship status: Not on file  . Intimate partner violence:    Fear of current or ex partner: Not on file    Emotionally abused: Not on file    Physically abused: Not on file    Forced sexual activity: Not on file  Other Topics Concern  . Not on file  Social History Narrative  . Not on file    FAMILY HISTORY: Family History  Problem Relation Age of Onset  . Cancer Mother  liver  . Heart attack Father   . Hypertension Father   . Diabetes Mellitus I Father   . Cancer Sister        pancreatic, breast  . Diabetes Mellitus I Sister   . Heart disease Brother   . Heart disease Brother   . Heart disease Brother   . Diabetes Mellitus I Sister   . Diabetes Mellitus I Sister     ALLERGIES:  is allergic to cephalexin; doxycycline; levofloxacin; prilosec [omeprazole]; and singulair [montelukast].  MEDICATIONS:  Current Outpatient Medications  Medication Sig Dispense Refill  . acetaminophen (TYLENOL) 500 MG tablet Take 500 mg by mouth every 8 (eight) hours as needed for mild pain or moderate pain.     Marland Kitchen LORazepam (ATIVAN) 0.5 MG tablet Take 1 tablet (0.5 mg total) by mouth every 8 (eight) hours as needed for anxiety (nausea). 30 tablet 0  . Multiple Vitamins-Minerals (HM MULTIVITAMIN ADULT GUMMY PO) Take 2 each by mouth daily.    . polyethylene glycol (MIRALAX / GLYCOLAX) packet Take 17 g by mouth daily.    . promethazine (PHENERGAN) 25 MG suppository Place 1 suppository (25 mg total) rectally every 6 (six) hours as needed for nausea or vomiting. 12 suppository 1  . triamcinolone (NASACORT AQ) 55  MCG/ACT AERO nasal inhaler Place 2 sprays into the nose daily.    Marland Kitchen HYDROcodone-acetaminophen (NORCO) 7.5-325 MG tablet Take 1 tablet by mouth every 6 (six) hours as needed for moderate pain. (Patient not taking: Reported on 09/26/2017) 20 tablet 0   No current facility-administered medications for this visit.     REVIEW OF SYSTEMS:   .10 Point review of Systems was done is negative except as noted above.   PHYSICAL EXAMINATION: ECOG PERFORMANCE STATUS: 1 VS stable- reviewed in EPIC   LABORATORY DATA:  I have reviewed the data as listed  . CBC Latest Ref Rng & Units 09/26/2017 08/29/2017 05/19/2007  WBC 3.9 - 10.3 K/uL 7.2 8.4 9.2  Hemoglobin 11.6 - 15.9 g/dL 12.5 - 13.4  Hematocrit 34.8 - 46.6 % 38.6 39.8 38.5  Platelets 145 - 400 K/uL 341 351 383  HGB 12.5   CMP Latest Ref Rng & Units 09/26/2017 08/29/2017 05/19/2007  Glucose 70 - 140 mg/dL 93 93 129(H)  BUN 7 - 26 mg/dL 9 8 8   Creatinine 0.60 - 1.10 mg/dL 0.91 0.88 0.69  Sodium 136 - 145 mmol/L 139 140 139  Potassium 3.5 - 5.1 mmol/L 4.3 3.8 3.8  Chloride 98 - 109 mmol/L 107 106 105  CO2 22 - 29 mmol/L 24 23 26   Calcium 8.4 - 10.4 mg/dL 9.9 10.0 9.7  Total Protein 6.4 - 8.3 g/dL 7.3 8.0 -  Total Bilirubin 0.2 - 1.2 mg/dL 0.4 0.3 -  Alkaline Phos 40 - 150 U/L 80 96 -  AST 5 - 34 U/L 30 19 -  ALT 0 - 55 U/L 19 13 -   Component     Latest Ref Rng & Units 08/29/2017  Retic Ct Pct     0.7 - 2.1 % 0.8  RBC.     3.70 - 5.45 MIL/uL 4.60  Retic Count, Absolute     33.7 - 90.7 K/uL 36.8  Sed Rate     0 - 22 mm/hr 30 (H)  LDH     125 - 245 U/L 179  HCV Ab     0.0 - 0.9 s/co ratio <0.1  Hepatitis B Surface Ag     Negative Negative  Hep B  Core Ab, Tot     Negative Negative     08/12/17 Lymph Node Needle/core Biopsy:   08/12/17 Tissue Flow Cytometry:    09/22/17 Tissue Flow Cytometry    09/22/17 Lymph Node Pathology:   RADIOGRAPHIC STUDIES: I have personally reviewed the radiological images as listed and agreed with  the findings in the report. Nm Pet Image Initial (pi) Skull Base To Thigh  Result Date: 09/17/2017 CLINICAL DATA:  Initial treatment strategy for Hodgkin lymphoma. EXAM: NUCLEAR MEDICINE PET SKULL BASE TO THIGH TECHNIQUE: 10.6 mCi F-18 FDG was injected intravenously. Full-ring PET imaging was performed from the skull base to thigh after the radiotracer. CT data was obtained and used for attenuation correction and anatomic localization. Fasting blood glucose: 96 mg/dl COMPARISON:  abdomen CT on 01/02/2016 FINDINGS: Mediastinal blood pool activity: SUV max = 3.2 NECK: Hypermetabolic upper internal jugular lymphadenopathy is seen on the left at levels 2A and 2B. Largest lymph node measures 2.3 cm on image 28/4 and has SUV max of 15.7. Incidental CT findings:  None. CHEST: No hypermetabolic masses or lymphadenopathy. An 8 mm noncalcified pulmonary nodule in the inferior right middle lobe on image 40/8 remains stable since previous study and shows no FDG activity. Incidental CT findings: 4.5 cm aneurysm of descending thoracic aortic is mildly increased in size from 4.2 cm on previous study. Aortic atherosclerosis. ABDOMEN/PELVIS: No abnormal hypermetabolic activity within the liver, pancreas, adrenal glands, or spleen. No hypermetabolic lymph nodes in the abdomen or pelvis. A 1.9 cm peritoneal soft tissue nodule with central calcification is seen along the inferior margin of the right hepatic lobe on image 110/4. This shows FDG uptake with SUV max of 4.9. This is stable in size compared to previous study. No other peritoneal nodules or ascites identified. Incidental CT findings: Stable tiny benign left adrenal adenoma. Aortic atherosclerosis. Sigmoid diverticulosis, without evidence of diverticulitis. SKELETON: No focal hypermetabolic bone lesions to suggest skeletal metastasis. Incidental CT findings:  None. IMPRESSION: Mild left cervical level 2 hypermetabolic lymphadenopathy. 1.9 cm hypermetabolic peritoneal soft  tissue nodule along the inferior margin of the right hepatic lobe. 4.5 cm descending thoracic aortic aneurysm, mildly increased from 4.2 cm in 2007. Recommend continued followup by chest CT in 1 year. Electronically Signed   By: Earle Gell M.D.   On: 09/17/2017 12:55    ASSESSMENT & PLAN:   72 y.o. female with  1. Stage IA Hodgkin's Lymphoma No constitutional symptoms Sed rate elevated to 30 Nl LDH Component     Latest Ref Rng & Units 08/29/2017  Retic Ct Pct     0.7 - 2.1 % 0.8  RBC.     3.70 - 5.45 MIL/uL 4.60  Retic Count, Absolute     33.7 - 90.7 K/uL 36.8  Sed Rate     0 - 22 mm/hr 30 (H)  LDH     125 - 245 U/L 179  HCV Ab     0.0 - 0.9 s/co ratio <0.1  Hepatitis B Surface Ag     Negative Negative  Hep B Core Ab, Tot     Negative Negative   PLAN -Discussed most recent PET/CT and 09/22/17 pathology report finding of classical Hodgkin's Lymphoma.  -PET findings most consistent with Stage I disease; discussed that a small hypermetabolic nodule found in her liver has remain unchanged for 4 years and is not considered to be involved with her Hodgkin's lymphoma.  -Left cervical lymph nodes are involved without further involvement. -Discussed that the pt  has mixed cellularity involvement of her Hodgkin's Lymphoma.  -Provided supplemental information on classical Hodgkin's Lymphoma -Reviewed the national cancer guidelines for treatment according to her Stage I Hodgkin's Lymphoma diagnosis.  -Discussed 3 cycles of AVD followed by either observation or involved-site radiation.  -Recommend that we drop Bleomycin from ABVD to AVD, given the early staging and the patient's age of 72 y/o.  -ECHO to ensure that the pt will be able to tolerate treatment -Will set pt up for chemotherapy class -Recommend eating well, hydrating well, and walking 20-30 minutes each day. -Will prescribe anti-nausea meds for prn use -Recommend that the pt avoid large crowds while on AVD -Discussed having a  port placed and the pt agrees to this. -Will prescirbe Emla cream. -Will plan to begin chemotherapy in the next week or so. -Discussed that if the pt develops any urgent dental needs, that the best time to do this would be before she begins chemotherapy.  -We will continue to monitor her oral cavity to mitigate the risk of infection. -We will see pt back after first dose of chemo    ECHO in 3-4 days Port-a-cath placement in 3-5 days Chemo-counseling for AVD in 5-6 days Schedule patient to start AVD in 7-10 days RTC with Dr Lebron Conners on C1D1 to evaluate labs and ECHO prior to chemotherapy start Will need toxicity check f/u in 1 week post C1D1 RTC with Dr Irene Limbo with labs C1D15 with labs   All of the patients questions were answered with apparent satisfaction. The patient knows to call the clinic with any problems, questions or concerns.  . The total time spent in the appointment was 30 minutes and more than 50% was on counseling and direct patient cares.     Sullivan Lone MD [place] AAHIVMS [facility] Hematology/Oncology Physician [facility]  (Office):       [phone] (Work cell):  [phone] (Fax):           [phone]  09/26/2017 11:15 AM  This document serves as a record of services personally performed by Sullivan Lone, MD. It was created on his behalf by Baldwin Jamaica, a trained medical scribe. The creation of this record is based on the scribe's personal observations and the provider's statements to them.   .I have reviewed the above documentation for accuracy and completeness, and I agree with the above. Brunetta Genera MD MS

## 2017-09-26 ENCOUNTER — Other Ambulatory Visit: Payer: Self-pay | Admitting: Hematology

## 2017-09-26 ENCOUNTER — Inpatient Hospital Stay: Payer: Medicare HMO | Attending: Hematology

## 2017-09-26 ENCOUNTER — Telehealth: Payer: Self-pay

## 2017-09-26 ENCOUNTER — Encounter: Payer: Self-pay | Admitting: Hematology

## 2017-09-26 ENCOUNTER — Inpatient Hospital Stay: Payer: Medicare HMO | Admitting: Hematology

## 2017-09-26 VITALS — BP 153/83 | HR 74 | Temp 98.5°F | Resp 17 | Ht 68.0 in | Wt 209.3 lb

## 2017-09-26 DIAGNOSIS — Z87891 Personal history of nicotine dependence: Secondary | ICD-10-CM

## 2017-09-26 DIAGNOSIS — Z7189 Other specified counseling: Secondary | ICD-10-CM

## 2017-09-26 DIAGNOSIS — D709 Neutropenia, unspecified: Secondary | ICD-10-CM | POA: Insufficient documentation

## 2017-09-26 DIAGNOSIS — C8198 Hodgkin lymphoma, unspecified, lymph nodes of multiple sites: Secondary | ICD-10-CM

## 2017-09-26 DIAGNOSIS — C8121 Mixed cellularity classical Hodgkin lymphoma, lymph nodes of head, face, and neck: Secondary | ICD-10-CM

## 2017-09-26 DIAGNOSIS — C8190 Hodgkin lymphoma, unspecified, unspecified site: Secondary | ICD-10-CM | POA: Insufficient documentation

## 2017-09-26 LAB — CMP (CANCER CENTER ONLY)
ALBUMIN: 3.7 g/dL (ref 3.5–5.0)
ALT: 19 U/L (ref 0–55)
AST: 30 U/L (ref 5–34)
Alkaline Phosphatase: 80 U/L (ref 40–150)
Anion gap: 8 (ref 3–11)
BUN: 9 mg/dL (ref 7–26)
CHLORIDE: 107 mmol/L (ref 98–109)
CO2: 24 mmol/L (ref 22–29)
Calcium: 9.9 mg/dL (ref 8.4–10.4)
Creatinine: 0.91 mg/dL (ref 0.60–1.10)
GFR, Est AFR Am: >60 mL/min (ref >60)
GFR, Estimated: >60 mL/min (ref >60)
GLUCOSE: 93 mg/dL (ref 70–140)
Potassium: 4.3 mmol/L (ref 3.5–5.1)
SODIUM: 139 mmol/L (ref 136–145)
Total Bilirubin: 0.4 mg/dL (ref 0.2–1.2)
Total Protein: 7.3 g/dL (ref 6.4–8.3)

## 2017-09-26 LAB — CBC WITH DIFFERENTIAL/PLATELET
Basophils Absolute: 0 10*3/uL (ref 0.0–0.1)
Basophils Relative: 0 %
EOS PCT: 2 %
Eosinophils Absolute: 0.2 10*3/uL (ref 0.0–0.5)
HCT: 38.6 % (ref 34.8–46.6)
Hemoglobin: 12.5 g/dL (ref 11.6–15.9)
LYMPHS ABS: 1.9 10*3/uL (ref 0.9–3.3)
LYMPHS PCT: 26 %
MCH: 27.9 pg (ref 25.1–34.0)
MCHC: 32.4 g/dL (ref 31.5–36.0)
MCV: 86.2 fL (ref 79.5–101.0)
MONO ABS: 0.8 10*3/uL (ref 0.1–0.9)
Monocytes Relative: 11 %
Neutro Abs: 4.4 10*3/uL (ref 1.5–6.5)
Neutrophils Relative %: 61 %
Platelets: 341 10*3/uL (ref 145–400)
RBC: 4.48 MIL/uL (ref 3.70–5.45)
RDW: 14.3 % (ref 11.2–14.5)
WBC: 7.2 10*3/uL (ref 3.9–10.3)

## 2017-09-26 LAB — SEDIMENTATION RATE: SED RATE: 25 mm/h — AB (ref 0–22)

## 2017-09-26 LAB — RETICULOCYTES
RBC.: 4.48 MIL/uL (ref 3.70–5.45)
Retic Count, Absolute: 44.8 10*3/uL (ref 33.7–90.7)
Retic Ct Pct: 1 % (ref 0.7–2.1)

## 2017-09-26 NOTE — Telephone Encounter (Signed)
Printed avs and calender for upcoming appointments. Per 4/5 los. Unable to verify infusion time acuartely . No care plan added

## 2017-09-29 ENCOUNTER — Inpatient Hospital Stay: Payer: Medicare HMO

## 2017-09-29 ENCOUNTER — Telehealth: Payer: Self-pay

## 2017-09-29 NOTE — Telephone Encounter (Signed)
Spoke with patient concerning upcoming appointments. She requested to move appointments until after she get her ECHO done and results have enough time. Per 4/5 los

## 2017-09-29 NOTE — Telephone Encounter (Signed)
Patient haves MyChart and also asked her to pick up a copy of calender as well today after chemo ed. Today per 4/8 phone call

## 2017-09-30 ENCOUNTER — Telehealth: Payer: Self-pay

## 2017-09-30 ENCOUNTER — Telehealth: Payer: Self-pay | Admitting: *Deleted

## 2017-09-30 NOTE — Telephone Encounter (Signed)
Pt left vm stating that she understood not to go to dentist for cleaning & her dentist called & states that she needs to come in.  Called pt back & informed that if she can go before her treatment starts, that would be a great idea but if not, would prefer that she delay it to after chemotherapy completed due to chances of nicking gum & introduction of bacteria.  Informed to call back with questions.

## 2017-09-30 NOTE — Telephone Encounter (Signed)
RN. R/s patient echo on 4/9. Patient was displeased and requested to have it placed back on the 17th. Aldona Bar said to leave patient infusion start date for the 22nd. Will call and try to have patient r/s for echo on original day.

## 2017-09-30 NOTE — Telephone Encounter (Signed)
Left a detailed message of echo being placed back on 4/17 @10am  per patient request due to previous scheduled appointments. Per 4/9 phone call return.

## 2017-10-01 ENCOUNTER — Other Ambulatory Visit (HOSPITAL_COMMUNITY): Payer: Medicare HMO

## 2017-10-05 DIAGNOSIS — C8121 Mixed cellularity classical Hodgkin lymphoma, lymph nodes of head, face, and neck: Secondary | ICD-10-CM | POA: Insufficient documentation

## 2017-10-05 DIAGNOSIS — Z7189 Other specified counseling: Secondary | ICD-10-CM | POA: Insufficient documentation

## 2017-10-05 MED ORDER — ONDANSETRON HCL 8 MG PO TABS: 8.0000 mg | ORAL_TABLET | Freq: Two times a day (BID) | ORAL | 1 refills | Status: DC | PRN

## 2017-10-05 MED ORDER — DEXAMETHASONE 4 MG PO TABS: ORAL_TABLET | ORAL | 1 refills | Status: DC

## 2017-10-05 MED ORDER — PROCHLORPERAZINE MALEATE 10 MG PO TABS: 10.0000 mg | ORAL_TABLET | Freq: Four times a day (QID) | ORAL | 1 refills | Status: DC | PRN

## 2017-10-05 MED ORDER — LIDOCAINE-PRILOCAINE 2.5-2.5 % EX CREA: TOPICAL_CREAM | CUTANEOUS | 3 refills | Status: DC

## 2017-10-05 NOTE — Progress Notes (Signed)
START ON PATHWAY REGIMEN - Lymphoma and CLL     A cycle is every 28 days:     Doxorubicin      Dacarbazine      Vinblastine      Bleomycin   **Always confirm dose/schedule in your pharmacy ordering system**  Patient Characteristics: Classical Hodgkin Lymphoma, First Line, Stage I / II, Early Favorable with  No Risk Factors  and No Bulk, Age ? 60 Disease Type: Not Applicable Disease Type: Not Applicable Disease Type: Classical Hodgkin Lymphoma Line of therapy: First Line [place] Stage: IA First Line, Stage I/II Disease Characteristics: Early Favorable with No Risk Factors and No Bulk Age: ? 58 Intent of Therapy: Curative Intent, Discussed with Patient 

## 2017-10-06 ENCOUNTER — Other Ambulatory Visit: Payer: Medicare HMO

## 2017-10-06 ENCOUNTER — Ambulatory Visit: Payer: Medicare HMO

## 2017-10-06 ENCOUNTER — Ambulatory Visit: Payer: Medicare HMO | Admitting: Hematology and Oncology

## 2017-10-07 ENCOUNTER — Other Ambulatory Visit: Payer: Self-pay | Admitting: Radiology

## 2017-10-08 ENCOUNTER — Ambulatory Visit (HOSPITAL_COMMUNITY)
Admission: RE | Admit: 2017-10-08 | Discharge: 2017-10-08 | Disposition: A | Discharge status: Home / Self Care | Payer: Medicare HMO | Source: Ambulatory Visit | Attending: Hematology | Admitting: Hematology

## 2017-10-08 ENCOUNTER — Other Ambulatory Visit (HOSPITAL_COMMUNITY): Payer: Medicare HMO

## 2017-10-08 DIAGNOSIS — C8121 Mixed cellularity classical Hodgkin lymphoma, lymph nodes of head, face, and neck: Secondary | ICD-10-CM | POA: Insufficient documentation

## 2017-10-08 DIAGNOSIS — I358 Other nonrheumatic aortic valve disorders: Secondary | ICD-10-CM | POA: Diagnosis not present

## 2017-10-08 DIAGNOSIS — I517 Cardiomegaly: Secondary | ICD-10-CM | POA: Insufficient documentation

## 2017-10-08 NOTE — Telephone Encounter (Signed)
Error opening  

## 2017-10-08 NOTE — Progress Notes (Signed)
  Echocardiogram 2D Echocardiogram has been performed.  Jennifer Juarez M 10/08/2017, 10:26 AM

## 2017-10-09 ENCOUNTER — Other Ambulatory Visit: Payer: Self-pay | Admitting: Radiology

## 2017-10-09 NOTE — Patient Instructions (Signed)
Arrive at Interventional Radiology at [facility] on 10/10/17 at 0930, npo after mdnt, have driver.

## 2017-10-10 ENCOUNTER — Ambulatory Visit (HOSPITAL_COMMUNITY)
Admission: RE | Admit: 2017-10-10 | Discharge: 2017-10-10 | Disposition: A | Discharge status: Home / Self Care | Payer: Medicare HMO | Source: Ambulatory Visit | Attending: Hematology | Admitting: Hematology

## 2017-10-10 ENCOUNTER — Other Ambulatory Visit: Payer: Self-pay | Admitting: Hematology

## 2017-10-10 ENCOUNTER — Encounter (HOSPITAL_COMMUNITY): Payer: Self-pay

## 2017-10-10 DIAGNOSIS — E669 Obesity, unspecified: Secondary | ICD-10-CM | POA: Insufficient documentation

## 2017-10-10 DIAGNOSIS — Z888 Allergy status to other drugs, medicaments and biological substances status: Secondary | ICD-10-CM | POA: Insufficient documentation

## 2017-10-10 DIAGNOSIS — Z9851 Tubal ligation status: Secondary | ICD-10-CM | POA: Diagnosis not present

## 2017-10-10 DIAGNOSIS — Z79899 Other long term (current) drug therapy: Secondary | ICD-10-CM | POA: Diagnosis not present

## 2017-10-10 DIAGNOSIS — C8190 Hodgkin lymphoma, unspecified, unspecified site: Secondary | ICD-10-CM | POA: Diagnosis not present

## 2017-10-10 DIAGNOSIS — Z881 Allergy status to other antibiotic agents status: Secondary | ICD-10-CM | POA: Insufficient documentation

## 2017-10-10 DIAGNOSIS — H3530 Unspecified macular degeneration: Secondary | ICD-10-CM | POA: Insufficient documentation

## 2017-10-10 DIAGNOSIS — M8580 Other specified disorders of bone density and structure, unspecified site: Secondary | ICD-10-CM | POA: Insufficient documentation

## 2017-10-10 DIAGNOSIS — Z5111 Encounter for antineoplastic chemotherapy: Secondary | ICD-10-CM | POA: Diagnosis not present

## 2017-10-10 DIAGNOSIS — Z9889 Other specified postprocedural states: Secondary | ICD-10-CM | POA: Insufficient documentation

## 2017-10-10 DIAGNOSIS — Z9049 Acquired absence of other specified parts of digestive tract: Secondary | ICD-10-CM | POA: Insufficient documentation

## 2017-10-10 DIAGNOSIS — Z7951 Long term (current) use of inhaled steroids: Secondary | ICD-10-CM | POA: Diagnosis not present

## 2017-10-10 DIAGNOSIS — C8121 Mixed cellularity classical Hodgkin lymphoma, lymph nodes of head, face, and neck: Secondary | ICD-10-CM | POA: Diagnosis present

## 2017-10-10 DIAGNOSIS — E559 Vitamin D deficiency, unspecified: Secondary | ICD-10-CM | POA: Diagnosis not present

## 2017-10-10 DIAGNOSIS — H8110 Benign paroxysmal vertigo, unspecified ear: Secondary | ICD-10-CM | POA: Diagnosis not present

## 2017-10-10 DIAGNOSIS — Z6831 Body mass index (BMI) 31.0-31.9, adult: Secondary | ICD-10-CM | POA: Diagnosis not present

## 2017-10-10 HISTORY — PX: IR US GUIDE VASC ACCESS RIGHT: IMG2390

## 2017-10-10 HISTORY — PX: IR FLUORO GUIDE PORT INSERTION RIGHT: IMG5741

## 2017-10-10 LAB — CBC
HEMATOCRIT: 38.8 % (ref 36.0–46.0)
HEMOGLOBIN: 12.7 g/dL (ref 12.0–15.0)
MCH: 28 pg (ref 26.0–34.0)
MCHC: 32.7 g/dL (ref 30.0–36.0)
MCV: 85.5 fL (ref 78.0–100.0)
Platelets: 353 10*3/uL (ref 150–400)
RBC: 4.54 MIL/uL (ref 3.87–5.11)
RDW: 14.1 % (ref 11.5–15.5)
WBC: 6.1 10*3/uL (ref 4.0–10.5)

## 2017-10-10 LAB — PROTIME-INR
INR: 0.96
Prothrombin Time: 12.7 seconds (ref 11.4–15.2)

## 2017-10-10 MED ORDER — MIDAZOLAM HCL 2 MG/2ML IJ SOLN
INTRAMUSCULAR | Status: AC | PRN
  Administered 2017-10-10 (×2): 1 mg via INTRAVENOUS

## 2017-10-10 MED ORDER — CLINDAMYCIN PHOSPHATE 600 MG/50ML IV SOLN
600.0000 mg | Freq: Once | INTRAVENOUS | Status: AC
  Administered 2017-10-10: 600 mg via INTRAVENOUS
  Filled 2017-10-10: qty 50

## 2017-10-10 MED ORDER — SODIUM CHLORIDE 0.9 % IV SOLN
INTRAVENOUS | Status: DC
  Administered 2017-10-10: 10:00:00 via INTRAVENOUS

## 2017-10-10 MED ORDER — LIDOCAINE-EPINEPHRINE (PF) 2 %-1:200000 IJ SOLN
INTRAMUSCULAR | Status: AC
  Filled 2017-10-10: qty 20

## 2017-10-10 MED ORDER — FENTANYL CITRATE (PF) 100 MCG/2ML IJ SOLN
INTRAMUSCULAR | Status: AC
  Filled 2017-10-10: qty 2

## 2017-10-10 MED ORDER — LIDOCAINE HCL 1 % IJ SOLN
INTRAMUSCULAR | Status: AC | PRN
  Administered 2017-10-10: 10 mL

## 2017-10-10 MED ORDER — LIDOCAINE-EPINEPHRINE (PF) 2 %-1:200000 IJ SOLN
INTRAMUSCULAR | Status: AC | PRN
  Administered 2017-10-10: 10 mL

## 2017-10-10 MED ORDER — HEPARIN SOD (PORK) LOCK FLUSH 100 UNIT/ML IV SOLN
INTRAVENOUS | Status: AC
  Filled 2017-10-10: qty 5

## 2017-10-10 MED ORDER — FENTANYL CITRATE (PF) 100 MCG/2ML IJ SOLN
INTRAMUSCULAR | Status: AC | PRN
  Administered 2017-10-10 (×2): 50 ug via INTRAVENOUS

## 2017-10-10 MED ORDER — MIDAZOLAM HCL 2 MG/2ML IJ SOLN
INTRAMUSCULAR | Status: AC
  Filled 2017-10-10: qty 4

## 2017-10-10 NOTE — H&P (Signed)
Referring Physician(s): Brunetta Genera  Supervising Physician: Arne Cleveland  Patient Status:  Jennifer Juarez OP  Chief Complaint:  "I'm here for a port a cath"  Subjective: Patient familiar to IR service from prior left cervical lymph node biopsy on 08/12/17.  She has a history of newly diagnosed Hodgkin's lymphoma and presents again today for Port-A-Cath placement for chemotherapy.  She currently denies fever, headache, chest pain, cough, abdominal/back pain, nausea, vomiting or bleeding.  Past Medical History:  Diagnosis Date  . BPPV (benign paroxysmal positional vertigo)   . H/O seasonal allergies   . Hearing loss in right ear   . Left cervical lymphadenopathy   . Macular degeneration   . Obesity   . Osteopenia   . Retinal vein occlusion   . Vitamin D deficiency    Past Surgical History:  Procedure Laterality Date  . CHOLECYSTECTOMY    . MASS EXCISION Left 09/22/2017   Procedure: LEFT CERVICAL LYMPH NODE EXCISION;  Surgeon: Izora Gala, MD;  Location: [facility];  Service: ENT;  Laterality: Left;  . TUBAL LIGATION       Allergies: Cephalexin; Doxycycline; Levofloxacin; Prilosec [omeprazole]; and Singulair [montelukast]  Medications: Prior to Admission medications   Medication Sig Start Date End Date Taking? Authorizing Provider  acetaminophen (TYLENOL) 500 MG tablet Take 500 mg by mouth every 8 (eight) hours as needed for mild pain or moderate pain.    Yes [provider]  LORazepam (ATIVAN) 0.5 MG tablet Take 1 tablet (0.5 mg total) by mouth every 8 (eight) hours as needed for anxiety (nausea). 09/16/17  Yes Brunetta Genera, MD  Multiple Vitamins-Minerals (HM MULTIVITAMIN ADULT GUMMY PO) Take 2 each by mouth daily.   Yes [provider]  polyethylene glycol (MIRALAX / GLYCOLAX) packet Take 17 g by mouth daily.   Yes [provider]  triamcinolone (NASACORT AQ) 55 MCG/ACT AERO nasal inhaler Place 2 sprays into the nose  daily.   Yes [provider]  dexamethasone (DECADRON) 4 MG tablet Take 2 tablets by mouth once a day on the day after chemotherapy and then take 2 tablets two times a day for 2 days. Take with food. 10/05/17   Brunetta Genera, MD  HYDROcodone-acetaminophen (NORCO) 7.5-325 MG tablet Take 1 tablet by mouth every 6 (six) hours as needed for moderate pain. Patient not taking: Reported on 09/26/2017 09/22/17   Izora Gala, MD  lidocaine-prilocaine (EMLA) cream Apply to affected area once 10/05/17   Brunetta Genera, MD  ondansetron (ZOFRAN) 8 MG tablet Take 1 tablet (8 mg total) by mouth 2 (two) times daily as needed. Start on the third day after chemotherapy. 10/05/17   Brunetta Genera, MD  prochlorperazine (COMPAZINE) 10 MG tablet Take 1 tablet (10 mg total) by mouth every 6 (six) hours as needed (Nausea or vomiting). 10/05/17   Brunetta Genera, MD  promethazine (PHENERGAN) 25 MG suppository Place 1 suppository (25 mg total) rectally every 6 (six) hours as needed for nausea or vomiting. 09/22/17   Izora Gala, MD     Vital Signs: BP (!) 146/77 (BP Location: Right Arm)   Pulse 66   Temp 97.7 F (36.5 C) (Oral)   Resp 16   Ht 5\' 8"  (1.727 m)   Wt 209 lb (94.8 kg)   SpO2 99%   BMI 31.78 kg/m   Physical Exam awake, alert.  Chest clear to auscultation bilaterally.  Heart with regular rate and rhythm.  Abdomen obese, soft, positive bowel  sounds, nontender.  No lower extremity edema.  Healing wound left cervical neck region.  Imaging: No results found.  Labs:  CBC: Recent Labs    08/29/17 1436 09/26/17 0946 10/10/17 0957  WBC 8.4 7.2 6.1  HGB  --  12.5 12.7  HCT 39.8 38.6 38.8  PLT 351 341 353    COAGS: No results for input(s): INR, APTT in the last 8760 hours.  BMP: Recent Labs    08/29/17 1436 09/26/17 0946  NA 140 139  K 3.8 4.3  CL 106 107  CO2 23 24  GLUCOSE 93 93  BUN 8 9  CALCIUM 10.0 9.9  CREATININE 0.88 0.91  GFRNONAA >60 >60  GFRAA >60  >60    LIVER FUNCTION TESTS: Recent Labs    08/29/17 1436 09/26/17 0946  BILITOT 0.3 0.4  AST 19 30  ALT 13 19  ALKPHOS 96 80  PROT 8.0 7.3  ALBUMIN 4.0 3.7    Assessment and Plan: Pt with history of newly diagnosed Hodgkin's lymphoma ; presents today for Port-A-Cath placement for chemotherapy. Risks and benefits of image guided port-a-catheter placement was discussed with the patient including, but not limited to bleeding, infection, pneumothorax, or fibrin sheath development and need for additional procedures.  All of the patient's questions were answered, patient is agreeable to proceed. Consent signed and in chart.     Electronically Signed: D. Rowe Robert, PA-C 10/10/2017, 10:47 AM   I spent a total of 20 minutes at the the patient's bedside AND on the patient's hospital floor or unit, greater than 50% of which was counseling/coordinating care for Port-A-Cath placement

## 2017-10-10 NOTE — Discharge Instructions (Signed)
Please keep dressing on for 24 hours. You may bathe after 24 hours.    Implanted Port Insertion, Care After This sheet gives you information about how to care for yourself after your procedure. Your health care provider may also give you more specific instructions. If you have problems or questions, contact your health care provider. What can I expect after the procedure? After your procedure, it is common to have:  Discomfort at the port insertion site.  Bruising on the skin over the port. This should improve over 3-4 days.  Follow these instructions at home: [facility] care  After your port is placed, you will get a manufacturer's information card. The card has information about your port. Keep this card with you at all times.  Take care of the port as told by your health care provider. Ask your health care provider if you or a family member can get training for taking care of the port at home. A home health care nurse may also take care of the port.  Make sure to remember what type of port you have. Incision care  Follow instructions from your health care provider about how to take care of your port insertion site. Make sure you: ? Wash your hands with soap and water before you change your bandage (dressing). If soap and water are not available, use hand sanitizer. ? Change your dressing as told by your health care provider. ? Leave stitches (sutures), skin glue, or adhesive strips in place. These skin closures may need to stay in place for 2 weeks or longer. If adhesive strip edges start to loosen and curl up, you may trim the loose edges. Do not remove adhesive strips completely unless your health care provider tells you to do that.  Check your port insertion site every day for signs of infection. Check for: ? More redness, swelling, or pain. ? More fluid or blood. ? Warmth. ? Pus or a bad smell. General instructions  Do not take baths, swim, or use a hot tub until your health care  provider approves.  Do not lift anything that is heavier than 10 lb (4.5 kg) for a week, or as told by your health care provider.  Ask your health care provider when it is okay to: ? Return to work or school. ? Resume usual physical activities or sports.  Do not drive for 24 hours if you were given a medicine to help you relax (sedative).  Take over-the-counter and prescription medicines only as told by your health care provider.  Wear a medical alert bracelet in case of an emergency. This will tell any health care providers that you have a port.  Keep all follow-up visits as told by your health care provider. This is important. Contact a health care provider if:  You cannot flush your port with saline as directed, or you cannot draw blood from the port.  You have a fever or chills.  You have more redness, swelling, or pain around your port insertion site.  You have more fluid or blood coming from your port insertion site.  Your port insertion site feels warm to the touch.  You have pus or a bad smell coming from the port insertion site. Get help right away if:  You have chest pain or shortness of breath.  You have bleeding from your port that you cannot control. Summary  Take care of the port as told by your health care provider.  Change your dressing as told by  your health care provider.  Keep all follow-up visits as told by your health care provider. This information is not intended to replace advice given to you by your health care provider. Make sure you discuss any questions you have with your health care provider. Document Released: 03/31/2013 Document Revised: 05/01/2016 Document Reviewed: 05/01/2016 Elsevier Interactive Patient Education  2017 [place].    Moderate Conscious Sedation, Adult, Care After These instructions provide you with information about caring for yourself after your procedure. Your health care provider may also give you more specific  instructions. Your treatment has been planned according to current medical practices, but problems sometimes occur. Call your health care provider if you have any problems or questions after your procedure. What can I expect after the procedure? After your procedure, it is common:  To feel sleepy for several hours.  To feel clumsy and have poor balance for several hours.  To have poor judgment for several hours.  To vomit if you eat too soon.  Follow these instructions at home: For at least 24 hours after the procedure:   Do not: ? Participate in activities where you could fall or become injured. ? Drive. ? Use heavy machinery. ? Drink alcohol. ? Take sleeping pills or medicines that cause drowsiness. ? Make important decisions or sign legal documents. ? Take care of children on your own.  Rest. Eating and drinking  Follow the diet recommended by your health care provider.  If you vomit: ? Drink water, juice, or soup when you can drink without vomiting. ? Make sure you have little or no nausea before eating solid foods. General instructions  Have a responsible adult stay with you until you are awake and alert.  Take over-the-counter and prescription medicines only as told by your health care provider.  If you smoke, do not smoke without supervision.  Keep all follow-up visits as told by your health care provider. This is important. Contact a health care provider if:  You keep feeling nauseous or you keep vomiting.  You feel light-headed.  You develop a rash.  You have a fever. Get help right away if:  You have trouble breathing. This information is not intended to replace advice given to you by your health care provider. Make sure you discuss any questions you have with your health care provider. Document Released: 03/31/2013 Document Revised: 11/13/2015 Document Reviewed: 09/30/2015 Elsevier Interactive Patient Education  [employer].

## 2017-10-10 NOTE — Procedures (Signed)
  Procedure: R IJ Port    EBL:   minimal Complications:  none immediate  See full dictation in [facility] PACS.  D. Lakyra Tippins MD Main # [phone] Pager  [phone]    

## 2017-10-13 ENCOUNTER — Inpatient Hospital Stay: Payer: Medicare HMO

## 2017-10-13 ENCOUNTER — Inpatient Hospital Stay: Payer: Medicare HMO | Admitting: Hematology and Oncology

## 2017-10-13 ENCOUNTER — Encounter: Payer: Self-pay | Admitting: Hematology and Oncology

## 2017-10-13 ENCOUNTER — Telehealth: Payer: Self-pay

## 2017-10-13 ENCOUNTER — Other Ambulatory Visit: Payer: Medicare HMO

## 2017-10-13 VITALS — BP 156/73 | HR 68 | Temp 97.9°F | Resp 17 | Ht 68.0 in | Wt 207.5 lb

## 2017-10-13 DIAGNOSIS — C8121 Mixed cellularity classical Hodgkin lymphoma, lymph nodes of head, face, and neck: Secondary | ICD-10-CM

## 2017-10-13 DIAGNOSIS — Z7189 Other specified counseling: Secondary | ICD-10-CM

## 2017-10-13 DIAGNOSIS — D709 Neutropenia, unspecified: Secondary | ICD-10-CM | POA: Diagnosis not present

## 2017-10-13 DIAGNOSIS — Z5111 Encounter for antineoplastic chemotherapy: Secondary | ICD-10-CM

## 2017-10-13 DIAGNOSIS — C8198 Hodgkin lymphoma, unspecified, lymph nodes of multiple sites: Secondary | ICD-10-CM

## 2017-10-13 DIAGNOSIS — Z87891 Personal history of nicotine dependence: Secondary | ICD-10-CM | POA: Diagnosis not present

## 2017-10-13 LAB — CBC WITH DIFFERENTIAL (CANCER CENTER ONLY)
BASOS ABS: 0 10*3/uL (ref 0.0–0.1)
Basophils Relative: 0 %
EOS ABS: 0.2 10*3/uL (ref 0.0–0.5)
EOS PCT: 2 %
HCT: 37.8 % (ref 34.8–46.6)
Hemoglobin: 12.2 g/dL (ref 11.6–15.9)
LYMPHS ABS: 1.7 10*3/uL (ref 0.9–3.3)
Lymphocytes Relative: 24 %
MCH: 27.8 pg (ref 25.1–34.0)
MCHC: 32.3 g/dL (ref 31.5–36.0)
MCV: 86.1 fL (ref 79.5–101.0)
MONO ABS: 0.7 10*3/uL (ref 0.1–0.9)
Monocytes Relative: 10 %
Neutro Abs: 4.6 10*3/uL (ref 1.5–6.5)
Neutrophils Relative %: 64 %
PLATELETS: 330 10*3/uL (ref 145–400)
RBC: 4.39 MIL/uL (ref 3.70–5.45)
RDW: 14.3 % (ref 11.2–14.5)
WBC: 7.3 10*3/uL (ref 3.9–10.3)

## 2017-10-13 LAB — CMP (CANCER CENTER ONLY)
ALT: 11 U/L (ref 0–55)
AST: 19 U/L (ref 5–34)
Albumin: 3.8 g/dL (ref 3.5–5.0)
Alkaline Phosphatase: 83 U/L (ref 40–150)
Anion gap: 8 (ref 3–11)
BILIRUBIN TOTAL: 0.4 mg/dL (ref 0.2–1.2)
BUN: 8 mg/dL (ref 7–26)
CO2: 23 mmol/L (ref 22–29)
CREATININE: 0.84 mg/dL (ref 0.60–1.10)
Calcium: 10.1 mg/dL (ref 8.4–10.4)
Chloride: 110 mmol/L — ABNORMAL HIGH (ref 98–109)
GFR, Est AFR Am: >60 mL/min (ref >60)
GLUCOSE: 94 mg/dL (ref 70–140)
Potassium: 3.9 mmol/L (ref 3.5–5.1)
Sodium: 141 mmol/L (ref 136–145)
TOTAL PROTEIN: 7.2 g/dL (ref 6.4–8.3)

## 2017-10-13 LAB — RETICULOCYTES
RBC.: 4.39 MIL/uL (ref 3.70–5.45)
RETIC CT PCT: 0.9 % (ref 0.7–2.1)
Retic Count, Absolute: 39.5 10*3/uL (ref 33.7–90.7)

## 2017-10-13 MED ORDER — HEPARIN SOD (PORK) LOCK FLUSH 100 UNIT/ML IV SOLN
500.0000 [IU] | Freq: Once | INTRAVENOUS | Status: AC | PRN
  Administered 2017-10-13: 500 [IU]
  Filled 2017-10-13: qty 5

## 2017-10-13 MED ORDER — SODIUM CHLORIDE 0.9 % IV SOLN
Freq: Once | INTRAVENOUS | Status: AC
  Administered 2017-10-13: 10:00:00 via INTRAVENOUS

## 2017-10-13 MED ORDER — SODIUM CHLORIDE 0.9 % IV SOLN
375.0000 mg/m2 | Freq: Once | INTRAVENOUS | Status: AC
  Administered 2017-10-13: 800 mg via INTRAVENOUS
  Filled 2017-10-13: qty 80

## 2017-10-13 MED ORDER — PALONOSETRON HCL INJECTION 0.25 MG/5ML
INTRAVENOUS | Status: AC
  Filled 2017-10-13: qty 5

## 2017-10-13 MED ORDER — PALONOSETRON HCL INJECTION 0.25 MG/5ML
0.2500 mg | Freq: Once | INTRAVENOUS | Status: AC
  Administered 2017-10-13: 0.25 mg via INTRAVENOUS

## 2017-10-13 MED ORDER — DOXORUBICIN HCL CHEMO IV INJECTION 2 MG/ML
25.0000 mg/m2 | Freq: Once | INTRAVENOUS | Status: AC
  Administered 2017-10-13: 54 mg via INTRAVENOUS
  Filled 2017-10-13: qty 27

## 2017-10-13 MED ORDER — FOSAPREPITANT DIMEGLUMINE INJECTION 150 MG
Freq: Once | INTRAVENOUS | Status: AC
  Administered 2017-10-13: 10:00:00 via INTRAVENOUS
  Filled 2017-10-13: qty 5

## 2017-10-13 MED ORDER — VINBLASTINE SULFATE CHEMO INJECTION 1 MG/ML
6.1000 mg/m2 | Freq: Once | INTRAVENOUS | Status: AC
  Administered 2017-10-13: 13 mg via INTRAVENOUS
  Filled 2017-10-13: qty 13

## 2017-10-13 MED ORDER — SODIUM CHLORIDE 0.9% FLUSH
10.0000 mL | INTRAVENOUS | Status: DC | PRN
  Administered 2017-10-13: 10 mL
  Filled 2017-10-13: qty 10

## 2017-10-13 MED ORDER — SODIUM CHLORIDE 0.9 % IJ SOLN
10.0000 mL | Freq: Once | INTRAMUSCULAR | Status: AC
  Administered 2017-10-13: 10 mL via INTRAVENOUS
  Filled 2017-10-13: qty 10

## 2017-10-13 NOTE — Progress Notes (Signed)
[facility] Follow-up Visit:  Assessment: Mixed cellularity Hodgkin lymphoma of lymph nodes of neck ([place]) 72 y.o. female with new diagnosis of Stage IA mixed cellularity Hodgkin's lymphoma of the cervical lymph nodes.  No evidence of advanced disease based on PET/CT.  Due to age of the patient, chemotherapy was planned without bleomycin component.  Curative intent of the systemic therapy, component medication, and schedule of administration has been discussed with the patient in detail today.  We have reviewed potential toxicities of the therapy as well.  Patient agrees to proceed with the recommended treatment.  Echocardiogram obtained prior to initiation of therapy shows left ventricular ejection fraction of 60-65%.  Clinical evaluation lab work today also permissive to proceed with the treatment as scheduled.  Plan: - Proceed with day 1 cycle 1 of AVD chemotherapy -Return to clinic in 1 week: Labs, clinic visit with Dr Irene Limbo for toxicity monitoring.   Voice recognition software was used and creation of this note. Despite my best effort at editing the text, some misspelling/errors may have occurred.  Orders Placed This Encounter  Procedures  . CBC with Differential (Cancer Center Only)    Standing Status:   Future    Standing Expiration Date:   10/14/2018  . CMP ([facility] only)    Standing Status:   Future    Standing Expiration Date:   10/14/2018  . Magnesium    Standing Status:   Future    Standing Expiration Date:   10/13/2018  . Phosphorus    Standing Status:   Future    Standing Expiration Date:   10/13/2018  . Uric acid    Standing Status:   Future    Standing Expiration Date:   10/13/2018    Cancer Staging No matching staging information was found for the patient.  All questions were answered.  . The patient knows to call the clinic with any problems, questions or concerns.  This note was electronically signed.    History of Presenting  Illness Jennifer Juarez 72 y.o. presenting to the [facility] for diagnosis of Hodgkin's lymphoma, stage Ia presenting to the clinic for initiation of curative-intent systemic chemotherapy with Adriamycin, vinblastine, dacarbazine (AVD).  Patient is routinely followed by Dr. Irene Limbo and I am seeing patient in his absence.  Patient denies any new symptoms since the last visit to the clinic.  She has attended a chemotherapy education session and all her questions have been appropriately covered at that time.    Oncological/hematological History:   Mixed cellularity Hodgkin lymphoma of lymph nodes of neck ([place])   10/05/2017 Initial Diagnosis    Mixed cellularity Hodgkin lymphoma of lymph nodes of neck ([place])      10/05/2017 -  Chemotherapy    The patient had DOXOrubicin (ADRIAMYCIN) chemo injection 54 mg, 25 mg/m2 = 54 mg, Intravenous,  Once, 1 of 6 cycles Administration: 54 mg (10/13/2017) palonosetron (ALOXI) injection 0.25 mg, 0.25 mg, Intravenous,  Once, 1 of 6 cycles Administration: 0.25 mg (10/13/2017) dacarbazine (DTIC) 800 mg in sodium chloride 0.9 % 250 mL chemo infusion, 375 mg/m2 = 800 mg, Intravenous,  Once, 1 of 6 cycles Administration: 800 mg (10/13/2017) vinBLAStine (VELBAN) 13 mg in sodium chloride 0.9 % 50 mL chemo infusion, 6.1 mg/m2 = 12.8 mg, Intravenous, Once, 1 of 6 cycles Administration: 13 mg (10/13/2017) fosaprepitant (EMEND) 150 mg, dexamethasone (DECADRON) 12 mg in sodium chloride 0.9 % 145 mL IVPB, , Intravenous,  Once, 1 of 6 cycles Administration:  (  10/13/2017)  for chemotherapy treatment.        Medical History: Past Medical History:  Diagnosis Date  . BPPV (benign paroxysmal positional vertigo)   . H/O seasonal allergies   . Hearing loss in right ear   . Left cervical lymphadenopathy   . Macular degeneration   . Obesity   . Osteopenia   . Retinal vein occlusion   . Vitamin D deficiency     Surgical History: Past Surgical History:  Procedure Laterality Date   . CHOLECYSTECTOMY    . IR FLUORO GUIDE PORT INSERTION RIGHT  10/10/2017  . IR US GUIDE VASC ACCESS RIGHT  10/10/2017  . MASS EXCISION Left 09/22/2017   Procedure: LEFT CERVICAL LYMPH NODE EXCISION;  Surgeon: Izora Gala, MD;  Location: [facility];  Service: ENT;  Laterality: Left;  . TUBAL LIGATION      Family History: Family History  Problem Relation Age of Onset  . Cancer Mother        liver  . Heart attack Father   . Hypertension Father   . Diabetes Mellitus I Father   . Cancer Sister        pancreatic, breast  . Diabetes Mellitus I Sister   . Heart disease Brother   . Heart disease Brother   . Heart disease Brother   . Diabetes Mellitus I Sister   . Diabetes Mellitus I Sister     Social History: Social History   Socioeconomic History  . Marital status: Single    Spouse name: Not on file  . Number of children: Not on file  . Years of education: Not on file  . Highest education level: Not on file  Occupational History  . Not on file  Social Needs  . Financial resource strain: Not on file  . Food insecurity:    Worry: Not on file    Inability: Not on file  . Transportation needs:    Medical: Not on file    Non-medical: Not on file  Tobacco Use  . Smoking status: Former Smoker    Last attempt to quit: 06/24/1988    Years since quitting: 29.3  . Smokeless tobacco: Never Used  Substance and Sexual Activity  . Alcohol use: No  . Drug use: No  . Sexual activity: Not on file  Lifestyle  . Physical activity:    Days per week: Not on file    Minutes per session: Not on file  . Stress: Not on file  Relationships  . Social connections:    Talks on phone: Not on file    Gets together: Not on file    Attends religious service: Not on file    Active member of club or organization: Not on file    Attends meetings of clubs or organizations: Not on file    Relationship status: Not on file  . Intimate partner violence:    Fear of current or ex partner:  Not on file    Emotionally abused: Not on file    Physically abused: Not on file    Forced sexual activity: Not on file  Other Topics Concern  . Not on file  Social History Narrative  . Not on file    Allergies: Allergies  Allergen Reactions  . Cephalexin Anaphylaxis    Tongue and throat swelling  . Doxycycline Diarrhea and Nausea And Vomiting  . Levofloxacin Other (See Comments)    Tachycardia  . Prilosec [Omeprazole] Other (See Comments)  Makes pt dizzy   ** Any Family Meds  . Singulair [Montelukast]     Rapid Heart rate    Medications:  Current Outpatient Medications  Medication Sig Dispense Refill  . acetaminophen (TYLENOL) 500 MG tablet Take 500 mg by mouth every 8 (eight) hours as needed for mild pain or moderate pain.     Marland Kitchen dexamethasone (DECADRON) 4 MG tablet Take 2 tablets by mouth once a day on the day after chemotherapy and then take 2 tablets two times a day for 2 days. Take with food. 30 tablet 1  . lidocaine-prilocaine (EMLA) cream Apply to affected area once 30 g 3  . LORazepam (ATIVAN) 0.5 MG tablet Take 1 tablet (0.5 mg total) by mouth every 8 (eight) hours as needed for anxiety (nausea). 30 tablet 0  . Multiple Vitamins-Minerals (HM MULTIVITAMIN ADULT GUMMY PO) Take 2 each by mouth daily.    . ondansetron (ZOFRAN) 8 MG tablet Take 1 tablet (8 mg total) by mouth 2 (two) times daily as needed. Start on the third day after chemotherapy. 30 tablet 1  . polyethylene glycol (MIRALAX / GLYCOLAX) packet Take 17 g by mouth daily.    . prochlorperazine (COMPAZINE) 10 MG tablet Take 1 tablet (10 mg total) by mouth every 6 (six) hours as needed (Nausea or vomiting). 30 tablet 1  . promethazine (PHENERGAN) 25 MG suppository Place 1 suppository (25 mg total) rectally every 6 (six) hours as needed for nausea or vomiting. 12 suppository 1  . triamcinolone (NASACORT AQ) 55 MCG/ACT AERO nasal inhaler Place 2 sprays into the nose daily.     No current facility-administered  medications for this visit.     Review of Systems: Review of Systems  All other systems reviewed and are negative.    PHYSICAL EXAMINATION Blood pressure (!) 156/73, pulse 68, temperature 97.9 F (36.6 C), temperature source Oral, resp. rate 17, height 5' 8"  (1.727 m), weight 207 lb 8 oz (94.1 kg), SpO2 98 %.  ECOG PERFORMANCE STATUS: 1 - Symptomatic but completely ambulatory  Physical Exam  Constitutional: She is oriented to person, place, and time. She appears well-developed and well-nourished. No distress.  HENT:  Head: Normocephalic and atraumatic.  Mouth/Throat: Oropharynx is clear and moist. No oropharyngeal exudate.  Eyes: Pupils are equal, round, and reactive to light. Conjunctivae and EOM are normal. No scleral icterus.  Neck: No thyromegaly present.  Cardiovascular: Normal rate, regular rhythm, normal heart sounds and intact distal pulses. Exam reveals no gallop and no friction rub.  No murmur heard. Pulmonary/Chest: Effort normal and breath sounds normal. No stridor. No respiratory distress. She has no wheezes. She has no rales.  Abdominal: Soft. Bowel sounds are normal. She exhibits no distension and no mass. There is no tenderness. There is no guarding.  Musculoskeletal: She exhibits no edema.  Lymphadenopathy:    She has no cervical adenopathy.  Neurological: She is oriented to person, place, and time. She displays normal reflexes. No cranial nerve deficit or sensory deficit.  Skin: Skin is dry. No rash noted. She is not diaphoretic. No erythema.     LABORATORY DATA: I have personally reviewed the data as listed: Appointment on 10/13/2017  Component Date Value Ref Range Status  . Sodium 10/13/2017 141  136 - 145 mmol/L Final  . Potassium 10/13/2017 3.9  3.5 - 5.1 mmol/L Final  . Chloride 10/13/2017 110* 98 - 109 mmol/L Final  . CO2 10/13/2017 23  22 - 29 mmol/L Final  . Glucose, Bld 10/13/2017  94  70 - 140 mg/dL Final  . BUN 10/13/2017 8  7 - 26 mg/dL Final  .  Creatinine 10/13/2017 0.84  0.60 - 1.10 mg/dL Final  . Calcium 10/13/2017 10.1  8.4 - 10.4 mg/dL Final  . Total Protein 10/13/2017 7.2  6.4 - 8.3 g/dL Final  . Albumin 10/13/2017 3.8  3.5 - 5.0 g/dL Final  . AST 10/13/2017 19  5 - 34 U/L Final  . ALT 10/13/2017 11  0 - 55 U/L Final  . Alkaline Phosphatase 10/13/2017 83  40 - 150 U/L Final  . Total Bilirubin 10/13/2017 0.4  0.2 - 1.2 mg/dL Final  . GFR, Est Non Af Am 10/13/2017 >60  >60 mL/min Final  . GFR, Est AFR Am 10/13/2017 >60  >60 mL/min Final   Comment: (NOTE) The eGFR has been calculated using the CKD EPI equation. This calculation has not been validated in all clinical situations. eGFR's persistently <60 mL/min signify possible Chronic Kidney Disease.   Georgiann Hahn gap 10/13/2017 8  3 - 11 Final   Performed at [facility], [place] [address], [place], [place] [address]  . WBC Count 10/13/2017 7.3  3.9 - 10.3 K/uL Final  . RBC 10/13/2017 4.39  3.70 - 5.45 MIL/uL Final  . Hemoglobin 10/13/2017 12.2  11.6 - 15.9 g/dL Final  . HCT 10/13/2017 37.8  34.8 - 46.6 % Final  . MCV 10/13/2017 86.1  79.5 - 101.0 fL Final  . MCH 10/13/2017 27.8  25.1 - 34.0 pg Final  . MCHC 10/13/2017 32.3  31.5 - 36.0 g/dL Final  . RDW 10/13/2017 14.3  11.2 - 14.5 % Final  . Platelet Count 10/13/2017 330  145 - 400 K/uL Final  . Neutrophils Relative % 10/13/2017 64  % Final  . Neutro Abs 10/13/2017 4.6  1.5 - 6.5 K/uL Final  . Lymphocytes Relative 10/13/2017 24  % Final  . Lymphs Abs 10/13/2017 1.7  0.9 - 3.3 K/uL Final  . Monocytes Relative 10/13/2017 10  % Final  . Monocytes Absolute 10/13/2017 0.7  0.1 - 0.9 K/uL Final  . Eosinophils Relative 10/13/2017 2  % Final  . Eosinophils Absolute 10/13/2017 0.2  0.0 - 0.5 K/uL Final  . Basophils Relative 10/13/2017 0  % Final  . Basophils Absolute 10/13/2017 0.0  0.0 - 0.1 K/uL Final   Performed at [facility], [place] [address], [place], [place] [address]  . Retic  Ct Pct 10/13/2017 0.9  0.7 - 2.1 % Final  . RBC. 10/13/2017 4.39  3.70 - 5.45 MIL/uL Final  . Retic Count, Absolute 10/13/2017 39.5  33.7 - 90.7 K/uL Final   Performed at [facility], [place] [address], [place], [place] [address]  Hospital Outpatient Visit on 10/10/2017  Component Date Value Ref Range Status  . WBC 10/10/2017 6.1  4.0 - 10.5 K/uL Final  . RBC 10/10/2017 4.54  3.87 - 5.11 MIL/uL Final  . Hemoglobin 10/10/2017 12.7  12.0 - 15.0 g/dL Final  . HCT 10/10/2017 38.8  36.0 - 46.0 % Final  . MCV 10/10/2017 85.5  78.0 - 100.0 fL Final  . MCH 10/10/2017 28.0  26.0 - 34.0 pg Final  . MCHC 10/10/2017 32.7  30.0 - 36.0 g/dL Final  . RDW 10/10/2017 14.1  11.5 - 15.5 % Final  . Platelets 10/10/2017 353  150 - 400 K/uL Final   Performed at [facility], [place] [address] [place], [place] [address]  .  Prothrombin Time 10/10/2017 12.7  11.4 - 15.2 seconds Final  . INR 10/10/2017 0.96   Final   Performed at [facility], [place] [address], [place], [place] [address]       Ardath Sax, MD

## 2017-10-13 NOTE — Assessment & Plan Note (Signed)
72 y.o. female with new diagnosis of Stage IA mixed cellularity Hodgkin's lymphoma of the cervical lymph nodes.  No evidence of advanced disease based on PET/CT.  Due to age of the patient, chemotherapy was planned without bleomycin component.  Curative intent of the systemic therapy, component medication, and schedule of administration has been discussed with the patient in detail today.  We have reviewed potential toxicities of the therapy as well.  Patient agrees to proceed with the recommended treatment.  Echocardiogram obtained prior to initiation of therapy shows left ventricular ejection fraction of 60-65%.  Clinical evaluation lab work today also permissive to proceed with the treatment as scheduled.  Plan: - Proceed with day 1 cycle 1 of AVD chemotherapy -Return to clinic in 1 week: Labs, clinic visit with Dr Irene Limbo for toxicity monitoring.

## 2017-10-13 NOTE — Patient Instructions (Signed)
[facility] Discharge Instructions for Patients Receiving Chemotherapy  Today you received the following chemotherapy agents Adriamycin, vinblastine, and Dacarbazine  To help prevent nausea and vomiting after your treatment, we encourage you to take your nausea medication as directed   If you develop nausea and vomiting that is not controlled by your nausea medication, call the clinic.   BELOW ARE SYMPTOMS THAT SHOULD BE REPORTED IMMEDIATELY:  *FEVER GREATER THAN 100.5 F  *CHILLS WITH OR WITHOUT FEVER  NAUSEA AND VOMITING THAT IS NOT CONTROLLED WITH YOUR NAUSEA MEDICATION  *UNUSUAL SHORTNESS OF BREATH  *UNUSUAL BRUISING OR BLEEDING  TENDERNESS IN MOUTH AND THROAT WITH OR WITHOUT PRESENCE OF ULCERS  *URINARY PROBLEMS  *BOWEL PROBLEMS  UNUSUAL RASH Items with * indicate a potential emergency and should be followed up as soon as possible.  Feel free to call the clinic should you have any questions or concerns. The clinic phone number is [phone].  Please show the [facility] at check-in to the Emergency Department and triage nurse.    Doxorubicin (Adriamyicn) injection What is this medicine? DOXORUBICIN (dox oh ROO bi sin) is a chemotherapy drug. It is used to treat many kinds of cancer like leukemia, lymphoma, neuroblastoma, sarcoma, and Wilms' tumor. It is also used to treat bladder cancer, breast cancer, lung cancer, ovarian cancer, stomach cancer, and thyroid cancer. This medicine may be used for other purposes; ask your health care provider or pharmacist if you have questions. COMMON BRAND NAME(S): Adriamycin, Adriamycin PFS, Adriamycin RDF, Rubex What should I tell my health care provider before I take this medicine? They need to know if you have any of these conditions: -heart disease -history of low blood counts caused by a medicine -liver disease -recent or ongoing radiation therapy -an unusual or allergic reaction to doxorubicin, other  chemotherapy agents, other medicines, foods, dyes, or preservatives -pregnant or trying to get pregnant -breast-feeding How should I use this medicine? This drug is given as an infusion into a vein. It is administered in a hospital or clinic by a specially trained health care professional. If you have pain, swelling, burning or any unusual feeling around the site of your injection, tell your health care professional right away. Talk to your pediatrician regarding the use of this medicine in children. Special care may be needed. Overdosage: If you think you have taken too much of this medicine contact a poison control center or emergency room at once. NOTE: This medicine is only for you. Do not share this medicine with others. What if I miss a dose? It is important not to miss your dose. Call your doctor or health care professional if you are unable to keep an appointment. What may interact with this medicine? This medicine may interact with the following medications: -6-mercaptopurine -paclitaxel -phenytoin -St. John's Wort -trastuzumab -verapamil This list may not describe all possible interactions. Give your health care provider a list of all the medicines, herbs, non-prescription drugs, or dietary supplements you use. Also tell them if you smoke, drink alcohol, or use illegal drugs. Some items may interact with your medicine. What should I watch for while using this medicine? This drug may make you feel generally unwell. This is not uncommon, as chemotherapy can affect healthy cells as well as cancer cells. Report any side effects. Continue your course of treatment even though you feel ill unless your doctor tells you to stop. There is a maximum amount of this medicine you should receive throughout your life.  The amount depends on the medical condition being treated and your overall health. Your doctor will watch how much of this medicine you receive in your lifetime. Tell your doctor if you  have taken this medicine before. You may need blood work done while you are taking this medicine. Your urine may turn red for a few days after your dose. This is not blood. If your urine is dark or brown, call your doctor. In some cases, you may be given additional medicines to help with side effects. Follow all directions for their use. Call your doctor or health care professional for advice if you get a fever, chills or sore throat, or other symptoms of a cold or flu. Do not treat yourself. This drug decreases your body's ability to fight infections. Try to avoid being around people who are sick. This medicine may increase your risk to bruise or bleed. Call your doctor or health care professional if you notice any unusual bleeding. Talk to your doctor about your risk of cancer. You may be more at risk for certain types of cancers if you take this medicine. Do not become pregnant while taking this medicine or for 6 months after stopping it. Women should inform their doctor if they wish to become pregnant or think they might be pregnant. Men should not father a child while taking this medicine and for 6 months after stopping it. There is a potential for serious side effects to an unborn child. Talk to your health care professional or pharmacist for more information. Do not breast-feed an infant while taking this medicine. This medicine has caused ovarian failure in some women and reduced sperm counts in some men This medicine may interfere with the ability to have a child. Talk with your doctor or health care professional if you are concerned about your fertility. What side effects may I notice from receiving this medicine? Side effects that you should report to your doctor or health care professional as soon as possible: -allergic reactions like skin rash, itching or hives, swelling of the face, lips, or tongue -breathing problems -chest pain -fast or irregular heartbeat -low blood counts - this  medicine may decrease the number of white blood cells, red blood cells and platelets. You may be at increased risk for infections and bleeding. -pain, redness, or irritation at site where injected -signs of infection - fever or chills, cough, sore throat, pain or difficulty passing urine -signs of decreased platelets or bleeding - bruising, pinpoint red spots on the skin, black, tarry stools, blood in the urine -swelling of the ankles, feet, hands -tiredness -weakness Side effects that usually do not require medical attention (report to your doctor or health care professional if they continue or are bothersome): -diarrhea -hair loss -mouth sores -nail discoloration or damage -nausea -red colored urine -vomiting This list may not describe all possible side effects. Call your doctor for medical advice about side effects. You may report side effects to FDA at 1-800-FDA-1088. Where should I keep my medicine? This drug is given in a hospital or clinic and will not be stored at home. NOTE: This sheet is a summary. It may not cover all possible information. If you have questions about this medicine, talk to your doctor, pharmacist, or health care provider.  2018 Elsevier/Gold Standard (2015-08-07 11:28:51)   Vinblastine injection What is this medicine? VINBLASTINE (vin BLAS teen) is a chemotherapy drug. It slows the growth of cancer cells. This medicine is used to treat many types of  cancer like breast cancer, testicular cancer, Hodgkin's disease, non-Hodgkin's lymphoma, and sarcoma. This medicine may be used for other purposes; ask your health care provider or pharmacist if you have questions. COMMON BRAND NAME(S): Velban What should I tell my health care provider before I take this medicine? They need to know if you have any of these conditions: -blood disorders -dental disease -gout -infection (especially a virus infection such as chickenpox, cold sores, or herpes) -liver disease -lung  disease -nervous system disease -recent or ongoing radiation therapy -an unusual or allergic reaction to vinblastine, other chemotherapy agents, other medicines, foods, dyes, or preservatives -pregnant or trying to get pregnant -breast-feeding How should I use this medicine? This drug is given as an infusion into a vein. It is administered in a hospital or clinic by a specially trained health care professional. If you have pain, swelling, burning or any unusual feeling around the site of your injection, tell your health care professional right away. Talk to your pediatrician regarding the use of this medicine in children. While this drug may be prescribed for selected conditions, precautions do apply. Overdosage: If you think you have taken too much of this medicine contact a poison control center or emergency room at once. NOTE: This medicine is only for you. Do not share this medicine with others. What if I miss a dose? It is important not to miss your dose. Call your doctor or health care professional if you are unable to keep an appointment. What may interact with this medicine? Do not take this medicine with any of the following medications: -erythromycin -itraconazole -mibefradil -voriconazole This medicine may also interact with the following medications: -cyclosporine -fluconazole -ketoconazole -medicines for seizures like phenytoin -medicines to increase blood counts like filgrastim, pegfilgrastim, sargramostim -vaccines -verapamil Talk to your doctor or health care professional before taking any of these medicines: -acetaminophen -aspirin -ibuprofen -ketoprofen -naproxen This list may not describe all possible interactions. Give your health care provider a list of all the medicines, herbs, non-prescription drugs, or dietary supplements you use. Also tell them if you smoke, drink alcohol, or use illegal drugs. Some items may interact with your medicine. What should I watch  for while using this medicine? Your condition will be monitored carefully while you are receiving this medicine. You will need important blood work done while you are taking this medicine. This drug may make you feel generally unwell. This is not uncommon, as chemotherapy can affect healthy cells as well as cancer cells. Report any side effects. Continue your course of treatment even though you feel ill unless your doctor tells you to stop. In some cases, you may be given additional medicines to help with side effects. Follow all directions for their use. Call your doctor or health care professional for advice if you get a fever, chills or sore throat, or other symptoms of a cold or flu. Do not treat yourself. This drug decreases your body's ability to fight infections. Try to avoid being around people who are sick. This medicine may increase your risk to bruise or bleed. Call your doctor or health care professional if you notice any unusual bleeding. Be careful brushing and flossing your teeth or using a toothpick because you may get an infection or bleed more easily. If you have any dental work done, tell your dentist you are receiving this medicine. Avoid taking products that contain aspirin, acetaminophen, ibuprofen, naproxen, or ketoprofen unless instructed by your doctor. These medicines may hide a fever. Do not become  pregnant while taking this medicine. Women should inform their doctor if they wish to become pregnant or think they might be pregnant. There is a potential for serious side effects to an unborn child. Talk to your health care professional or pharmacist for more information. Do not breast-feed an infant while taking this medicine. Men may have a lower sperm count while taking this medicine. Talk to your doctor if you plan to father a child. What side effects may I notice from receiving this medicine? Side effects that you should report to your doctor or health care professional as soon  as possible: -allergic reactions like skin rash, itching or hives, swelling of the face, lips, or tongue -low blood counts - This drug may decrease the number of white blood cells, red blood cells and platelets. You may be at increased risk for infections and bleeding. -signs of infection - fever or chills, cough, sore throat, pain or difficulty passing urine -signs of decreased platelets or bleeding - bruising, pinpoint red spots on the skin, black, tarry stools, nosebleeds -signs of decreased red blood cells - unusually weak or tired, fainting spells, lightheadedness -breathing problems -changes in hearing -change in the amount of urine -chest pain -high blood pressure -mouth sores -nausea and vomiting -pain, swelling, redness or irritation at the injection site -pain, tingling, numbness in the hands or feet -problems with balance, dizziness -seizures Side effects that usually do not require medical attention (report to your doctor or health care professional if they continue or are bothersome): -constipation -hair loss -jaw pain -loss of appetite -sensitivity to light -stomach pain -tumor pain This list may not describe all possible side effects. Call your doctor for medical advice about side effects. You may report side effects to FDA at 1-800-FDA-1088. Where should I keep my medicine? This drug is given in a hospital or clinic and will not be stored at home. NOTE: This sheet is a summary. It may not cover all possible information. If you have questions about this medicine, talk to your doctor, pharmacist, or health care provider.  2018 Elsevier/Gold Standard (2008-03-07 17:15:59)   Dacarbazine, DTIC injection What is this medicine? DACARBAZINE (da KAR ba zeen) is a chemotherapy drug. This medicine is used to treat skin cancer. It is also used with other medicines to treat Hodgkin's disease. This medicine may be used for other purposes; ask your health care provider or  pharmacist if you have questions. COMMON BRAND NAME(S): DTIC-Dome What should I tell my health care provider before I take this medicine? They need to know if you have any of these conditions: -infection (especially virus infection such as chickenpox, cold sores, or herpes) -kidney disease -liver disease -low blood counts like low platelets, red blood cells, white blood cells -recent radiation therapy -an unusual or allergic reaction to dacarbazine, other chemotherapy agents, other medicines, foods, dyes, or preservatives -pregnant or trying to get pregnant -breast-feeding How should I use this medicine? This drug is given as an injection or infusion into a vein. It is administered in a hospital or clinic by a specially trained health care professional. Talk to your pediatrician regarding the use of this medicine in children. While this drug may be prescribed for selected conditions, precautions do apply. Overdosage: If you think you have taken too much of this medicine contact a poison control center or emergency room at once. NOTE: This medicine is only for you. Do not share this medicine with others. What if I miss a dose? It is important  not to miss your dose. Call your doctor or health care professional if you are unable to keep an appointment. What may interact with this medicine? -medicines to increase blood counts like filgrastim, pegfilgrastim, sargramostim -vaccines This list may not describe all possible interactions. Give your health care provider a list of all the medicines, herbs, non-prescription drugs, or dietary supplements you use. Also tell them if you smoke, drink alcohol, or use illegal drugs. Some items may interact with your medicine. What should I watch for while using this medicine? Your condition will be monitored carefully while you are receiving this medicine. You will need important blood work done while you are taking this medicine. This drug may make you feel  generally unwell. This is not uncommon, as chemotherapy can affect healthy cells as well as cancer cells. Report any side effects. Continue your course of treatment even though you feel ill unless your doctor tells you to stop. Call your doctor or health care professional for advice if you get a fever, chills or sore throat, or other symptoms of a cold or flu. Do not treat yourself. This drug decreases your body's ability to fight infections. Try to avoid being around people who are sick. This medicine may increase your risk to bruise or bleed. Call your doctor or health care professional if you notice any unusual bleeding. Talk to your doctor about your risk of cancer. You may be more at risk for certain types of cancers if you take this medicine. Do not become pregnant while taking this medicine. Women should inform their doctor if they wish to become pregnant or think they might be pregnant. There is a potential for serious side effects to an unborn child. Talk to your health care professional or pharmacist for more information. Do not breast-feed an infant while taking this medicine. What side effects may I notice from receiving this medicine? Side effects that you should report to your doctor or health care professional as soon as possible: -allergic reactions like skin rash, itching or hives, swelling of the face, lips, or tongue -low blood counts - this medicine may decrease the number of white blood cells, red blood cells and platelets. You may be at increased risk for infections and bleeding. -signs of infection - fever or chills, cough, sore throat, pain or difficulty passing urine -signs of decreased platelets or bleeding - bruising, pinpoint red spots on the skin, black, tarry stools, blood in the urine -signs of decreased red blood cells - unusually weak or tired, fainting spells, lightheadedness -breathing problems -muscle pains -pain at site where injected -trouble passing urine or  change in the amount of urine -vomiting -yellowing of the eyes or skin Side effects that usually do not require medical attention (report to your doctor or health care professional if they continue or are bothersome): -diarrhea -hair loss -loss of appetite -nausea -skin more sensitive to sun or ultraviolet light -stomach upset This list may not describe all possible side effects. Call your doctor for medical advice about side effects. You may report side effects to FDA at 1-800-FDA-1088. Where should I keep my medicine? This drug is given in a hospital or clinic and will not be stored at home. NOTE: This sheet is a summary. It may not cover all possible information. If you have questions about this medicine, talk to your doctor, pharmacist, or health care provider.  2018 Elsevier/Gold Standard (2015-08-11 15:17:39)

## 2017-10-13 NOTE — Telephone Encounter (Signed)
Patient declined avs and calender. Per 4/22 los she is already scheduled for 4/29 appointments.

## 2017-10-13 NOTE — Patient Instructions (Signed)

## 2017-10-14 ENCOUNTER — Telehealth: Payer: Self-pay | Admitting: Emergency Medicine

## 2017-10-14 NOTE — Telephone Encounter (Signed)
TCT patient.  Pt reports taking some nausea medication for mild nausea this am and last night.  Otherwise tolerating well, eating and drinking normally.  Denies any other concerns or symptoms.  Verbalized understanding that she can call with any questions.

## 2017-10-15 ENCOUNTER — Encounter: Payer: Self-pay | Admitting: *Deleted

## 2017-10-15 NOTE — Progress Notes (Signed)
Patient calling to say directions on her emla cream states to apply to skin daily. spokewith pharmacist at [facility] [address] and had instructions to read apply approx 1/2 tsp to skin over port prior to chemotherapy treatments

## 2017-10-16 NOTE — Progress Notes (Signed)
Prior auth for Lidocaine/Prilocaine 2.5-2.5% cream has been submitted. Status is pending.

## 2017-10-17 ENCOUNTER — Telehealth: Payer: Self-pay | Admitting: *Deleted

## 2017-10-17 ENCOUNTER — Other Ambulatory Visit: Payer: Self-pay | Admitting: Hematology and Oncology

## 2017-10-17 ENCOUNTER — Encounter: Payer: Self-pay | Admitting: *Deleted

## 2017-10-17 MED ORDER — MAGIC MOUTHWASH W/LIDOCAINE: ORAL | 0 refills | Status: DC

## 2017-10-17 MED ORDER — ACYCLOVIR 400 MG PO TABS: 400.0000 mg | ORAL_TABLET | Freq: Two times a day (BID) | ORAL | 0 refills | Status: DC

## 2017-10-17 NOTE — Telephone Encounter (Signed)
Patient calling to say her mouth is red and very sore, unable to get her partial in. Has used warm salt water rinses, not helping.

## 2017-10-17 NOTE — Telephone Encounter (Signed)
Per dr Lebron Conners, magic mouthwash /w lidocaine called to patient's pharmacy [facility] [address]. Along with acyclovir. Patient to leave partial out as much as possible while healing. Patient verbalized understanding.

## 2017-10-19 NOTE — Progress Notes (Signed)
HEMATOLOGY/ONCOLOGY CLINIC NOTE  Date of Service: 10/20/17  Patient Care Team: Kathyrn Lass, MD as PCP - General (Family Medicine)  CHIEF COMPLAINTS/PURPOSE OF CONSULTATION:  F/u for continued mx of  Hodgkin's Lymphoma   HISTORY OF PRESENTING ILLNESS:   Jennifer Juarez is a wonderful 72 y.o. female who has been referred to [place] by Dr. Lattie Haw Miller/Courtney Rolland Porter PA_C  for evaluation and management of likely newly diagnosed Hodgkin's Lymphoma.   The pt reports that she is doing very well overall and notes that she is fully functional and does not have any significant chronic medical problems or any functional limitations.  She notes that on 06/26/17 she noticed a knot on the left side of her neck that subsequently precipitated her CT scan and Bx prior to seeing [place] today.   On 07/28/17 the pt had a CT Soft Tissue Neck revealing Enlarged lymph nodes in the left neck compatible with neoplasm.Enlarged lymph nodes in the left neck. Large left level 2 lymph node 23 x 30 mm. Multiple posterior lymph nodes are present measuring up to 10 mm. No enlarged lymph nodes in the right neck. Biopsy recommended. Attention left tonsil on direct mucosal inspection. Possible tonsillar carcinoma on the left. Lymphoma also in the differential.    Of note prior to the patient's visit, pt has had a Lymph node Needle/Core biopsy completed on 08/12/17 with results revealing ATYPICAL LYMPHOID PROLIFERATION. Immunohistochemical stains were performed including LCA, CD20, PAX-5, CD3, CD15, and CD30 with appropriate controls. The large atypical lymphoid-appearing cells are positive for CD30, CD15 and PAX-5 and negative for CD3, CD20 and LCA. The lymphocytic population in the background show a mixture of T and B-cells with predominance of T-cells. The overall findings are limited but atypical and worrisome for a lymphoproliferative process, particularly. classical Hodgkin lymphoma. Excisional biopsy is strongly  recommended.  Also on 08/12/17 the pt had a Tissue Flow Cytometry revealing NO MONOCLONAL B-CELL POPULATION OR ABNORMAL T-CELL PHENOTYPE IDENTIFIED.   Most recent CBC with Diff. results (07/15/17) revealed all values WNL.   She notes that she has had a viral infection in 2005 that resulted in a complete loss of hearing in her right ear. She notes that her left ear has had fluid build up and has resulted in significant loss of hearing, for which she uses hearing aids.  On review of systems, pt reports good energy levels, and denies fevers, chills, night sweats, unexpected weight loss, skin itching, rashes, soreness, sore throat, difficulties swallowing, back pains, abdominal pains, leg swelling, and any other symptoms.   On PMHx the pt reports arthritis, vertigo, polyps. She denies heart, lung, kidney or liver problems. She denies DM.  On Social Hx the pt notes having quit smoking when she was 37. She reports infrequent EOH consumption. She denies chemical or radiation exposure.  On Family Hx she reports that her mother had liver cancer. Her sister had breast cancer at 24 y/o and pancreatic cancer at 74 y/o.  Interval History:  Jennifer Juarez returns today regarding her newly diagnosed classical Hodgkin's Lymphoma. The patient's last visit with [place] was on 09/29/17. She is accompanied today by her nephew. The pt reports that she is doing well overall.   The pt reports that she was doing very well with her treatment until four days ago, 10/16/17. She noted joint/muscle pain, mouth sores, and bone pain. She was started on magic mouthwash and Acyclovir. She notes that her mouth soreness has since resolved. She notes that  she self treated her mouth sores with baking soda mouthwash.   She also notes that she has had some difficulty sleeping but wasn't too bothered by this. She has also lost a couple pounds likely due to not eating very much due to mouth sores. Since her mouths sores have resolved she has  been eating well. She also notes that she has been walking around each day.  Of note since the patient's last visit, pt has had an ECHO completed on 10/08/17 with results revealing - Left ventricle: Marland Kitchen The   estimated ejection fraction was in the range of 60% to 65%. Wall motion was normal; there were no regional wall motion   abnormalities. Left ventricular diastolic function parameters were normal. Aortic valve: Mildly calcified annulus. Trileaflet; normal   thickness, mildly calcified leaflets. Atrial septum: There was increased thickness of the septum, consistent with lipomatous hypertrophy. Tricuspid valve: There was trivial regurgitation.  Lab results today (10/20/17) of CBC, CMP, and Reticulocytes is as follows: all values are WNL except for WBC at 2.6k, Neutro Abs at 1.3k, Monocytes Abs at 0.0k, Sodium at 135. Uric Acid 10/20/17 is WNL at 5.1 Magnesium is WNL at 2.1.  On review of systems, pt reports resolving mouth sores, resolving joint/muscle/bone pain, loose stools, and denies tingling or numbness in her hands or feet, constipation, diarrhea, fatigue, skin rashes, fevers, chills, leg swelling, and any other symptoms.    MEDICAL HISTORY:  Past Medical History:  Diagnosis Date  . BPPV (benign paroxysmal positional vertigo)   . H/O seasonal allergies   . Hearing loss in right ear   . Left cervical lymphadenopathy   . Macular degeneration   . Obesity   . Osteopenia   . Retinal vein occlusion   . Vitamin D deficiency     SURGICAL HISTORY: Past Surgical History:  Procedure Laterality Date  . CHOLECYSTECTOMY    . IR FLUORO GUIDE PORT INSERTION RIGHT  10/10/2017  . IR US GUIDE VASC ACCESS RIGHT  10/10/2017  . MASS EXCISION Left 09/22/2017   Procedure: LEFT CERVICAL LYMPH NODE EXCISION;  Surgeon: Izora Gala, MD;  Location: [facility];  Service: ENT;  Laterality: Left;  . TUBAL LIGATION      SOCIAL HISTORY: Social History   Socioeconomic History  . Marital  status: Single    Spouse name: Not on file  . Number of children: Not on file  . Years of education: Not on file  . Highest education level: Not on file  Occupational History  . Not on file  Social Needs  . Financial resource strain: Not on file  . Food insecurity:    Worry: Not on file    Inability: Not on file  . Transportation needs:    Medical: Not on file    Non-medical: Not on file  Tobacco Use  . Smoking status: Former Smoker    Last attempt to quit: 06/24/1988    Years since quitting: 29.3  . Smokeless tobacco: Never Used  Substance and Sexual Activity  . Alcohol use: No  . Drug use: No  . Sexual activity: Not on file  Lifestyle  . Physical activity:    Days per week: Not on file    Minutes per session: Not on file  . Stress: Not on file  Relationships  . Social connections:    Talks on phone: Not on file    Gets together: Not on file    Attends religious service: Not on file  Active member of club or organization: Not on file    Attends meetings of clubs or organizations: Not on file    Relationship status: Not on file  . Intimate partner violence:    Fear of current or ex partner: Not on file    Emotionally abused: Not on file    Physically abused: Not on file    Forced sexual activity: Not on file  Other Topics Concern  . Not on file  Social History Narrative  . Not on file    FAMILY HISTORY: Family History  Problem Relation Age of Onset  . Cancer Mother        liver  . Heart attack Father   . Hypertension Father   . Diabetes Mellitus I Father   . Cancer Sister        pancreatic, breast  . Diabetes Mellitus I Sister   . Heart disease Brother   . Heart disease Brother   . Heart disease Brother   . Diabetes Mellitus I Sister   . Diabetes Mellitus I Sister     ALLERGIES:  is allergic to cephalexin; doxycycline; levofloxacin; prilosec [omeprazole]; and singulair [montelukast].  MEDICATIONS:  Current Outpatient Medications  Medication Sig  Dispense Refill  . acetaminophen (TYLENOL) 500 MG tablet Take 500 mg by mouth every 8 (eight) hours as needed for mild pain or moderate pain.     Marland Kitchen dexamethasone (DECADRON) 4 MG tablet Take 2 tablets by mouth once a day on the day after chemotherapy and then take 2 tablets two times a day for 2 days. Take with food. 30 tablet 1  . lidocaine-prilocaine (EMLA) cream Apply to affected area once 30 g 3  . LORazepam (ATIVAN) 0.5 MG tablet Take 1 tablet (0.5 mg total) by mouth every 8 (eight) hours as needed for anxiety (nausea). 30 tablet 0  . Multiple Vitamins-Minerals (HM MULTIVITAMIN ADULT GUMMY PO) Take 2 each by mouth daily.    . ondansetron (ZOFRAN) 8 MG tablet Take 1 tablet (8 mg total) by mouth 2 (two) times daily as needed. Start on the third day after chemotherapy. 30 tablet 1  . polyethylene glycol (MIRALAX / GLYCOLAX) packet Take 17 g by mouth daily.    . prochlorperazine (COMPAZINE) 10 MG tablet Take 1 tablet (10 mg total) by mouth every 6 (six) hours as needed (Nausea or vomiting). 30 tablet 1  . promethazine (PHENERGAN) 25 MG suppository Place 1 suppository (25 mg total) rectally every 6 (six) hours as needed for nausea or vomiting. 12 suppository 1  . triamcinolone (NASACORT AQ) 55 MCG/ACT AERO nasal inhaler Place 2 sprays into the nose daily.    Marland Kitchen acyclovir (ZOVIRAX) 400 MG tablet Take 1 tablet (400 mg total) by mouth 2 (two) times daily for 14 days. (Patient not taking: Reported on 10/20/2017) 28 tablet 0  . magic mouthwash w/lidocaine SOLN 5 mls qid swish and spit (Patient not taking: Reported on 10/20/2017) 240 mL 0   No current facility-administered medications for this visit.     REVIEW OF SYSTEMS:   .10 Point review of Systems was done is negative except as noted above. PHYSICAL EXAMINATION: ECOG PERFORMANCE STATUS: 1 .BP 135/71 (BP Location: Left Arm, Patient Position: Sitting)   Pulse 99   Temp 97.8 F (36.6 C) (Oral)   Resp 17   Ht 5\' 8"  (1.727 m)   Wt 199 lb 6.4 oz  (90.4 kg)   SpO2 100%   BMI 30.32 kg/m  . GENERAL:alert, in no acute  distress and comfortable SKIN: no acute rashes, no significant lesions EYES: conjunctiva are pink and non-injected, sclera anicteric OROPHARYNX: MMM, no exudates, no oropharyngeal erythema or ulceration NECK: supple, no JVD LYMPH:  no palpable lymphadenopathy in the cervical, axillary or inguinal regions LUNGS: clear to auscultation b/l with normal respiratory effort HEART: regular rate & rhythm ABDOMEN:  normoactive bowel sounds , non tender, not distended. Extremity: no pedal edema PSYCH: alert & oriented x 3 with fluent speech NEURO: no focal motor/sensory deficits  LABORATORY DATA:  I have reviewed the data as listed  . CBC Latest Ref Rng & Units 10/20/2017 10/13/2017 10/10/2017  WBC 3.9 - 10.3 K/uL 2.6(L) 7.3 6.1  Hemoglobin 11.6 - 15.9 g/dL 12.2 12.2 12.7  Hematocrit 34.8 - 46.6 % 37.1 37.8 38.8  Platelets 145 - 400 K/uL 222 330 353  ANC 1.3k   CMP Latest Ref Rng & Units 10/20/2017 10/13/2017 09/26/2017  Glucose 70 - 140 mg/dL 94 94 93  BUN 7 - 26 mg/dL 12 8 9   Creatinine 0.60 - 1.10 mg/dL 0.83 0.84 0.91  Sodium 136 - 145 mmol/L 135(L) 141 139  Potassium 3.5 - 5.1 mmol/L 3.7 3.9 4.3  Chloride 98 - 109 mmol/L 101 110(H) 107  CO2 22 - 29 mmol/L 24 23 24   Calcium 8.4 - 10.4 mg/dL 10.0 10.1 9.9  Total Protein 6.4 - 8.3 g/dL 7.2 7.2 7.3  Total Bilirubin 0.2 - 1.2 mg/dL 0.6 0.4 0.4  Alkaline Phos 40 - 150 U/L 75 83 80  AST 5 - 34 U/L 25 19 30   ALT 0 - 55 U/L 32 11 19   Component     Latest Ref Rng & Units 08/29/2017  Retic Ct Pct     0.7 - 2.1 % 0.8  RBC.     3.70 - 5.45 MIL/uL 4.60  Retic Count, Absolute     33.7 - 90.7 K/uL 36.8  Sed Rate     0 - 22 mm/hr 30 (H)  LDH     125 - 245 U/L 179  HCV Ab     0.0 - 0.9 s/co ratio <0.1  Hepatitis B Surface Ag     Negative Negative  Hep B Core Ab, Tot     Negative Negative     08/12/17 Lymph Node Needle/core Biopsy:   08/12/17 Tissue Flow  Cytometry:    09/22/17 Tissue Flow Cytometry    09/22/17 Lymph Node Pathology:   RADIOGRAPHIC STUDIES: I have personally reviewed the radiological images as listed and agreed with the findings in the report. Ir US Guide Vasc Access Right  Result Date: 10/10/2017 CLINICAL DATA:  Hodgkin's lymphoma. Needs durable venous access for planned chemotherapy regimen. EXAM: TUNNELED PORT CATHETER PLACEMENT WITH ULTRASOUND AND FLUOROSCOPIC GUIDANCE FLUOROSCOPY TIME:  0.1 minute; 25 uGym2 DAP ANESTHESIA/SEDATION: Intravenous Fentanyl and Versed were administered as conscious sedation during continuous monitoring of the patient's level of consciousness and physiological / cardiorespiratory status by the radiology RN, with a total moderate sedation time of 12 minutes. TECHNIQUE: The procedure, risks, benefits, and alternatives were explained to the patient. Questions regarding the procedure were encouraged and answered. The patient understands and consents to the procedure. As antibiotic prophylaxis, Cleocin 600 mg was ordered pre-procedure and administered intravenously within one hour of incision. Patency of the right IJ vein was confirmed with ultrasound with image documentation. An appropriate skin site was determined. Skin site was marked. Region was prepped using maximum barrier technique including cap and mask, sterile gown, sterile gloves, large  sterile sheet, and Chlorhexidine as cutaneous antisepsis. The region was infiltrated locally with 1% lidocaine. Under real-time ultrasound guidance, the right IJ vein was accessed with a 21 gauge micropuncture needle; the needle tip within the vein was confirmed with ultrasound image documentation. Needle was exchanged over a 018 guidewire for transitional dilator which allowed passage of the [facility] wire into the IVC. Over this, the transitional dilator was exchanged for a 5 [place] MPA catheter. A small incision was made on the right anterior chest wall and a  subcutaneous pocket fashioned. The power-injectable port was positioned and its catheter tunneled to the right IJ dermatotomy site. The MPA catheter was exchanged over an Amplatz wire for a peel-away sheath, through which the port catheter, which had been trimmed to the appropriate length, was advanced and positioned under fluoroscopy with its tip at the cavoatrial junction. Spot chest radiograph confirms good catheter position and no pneumothorax. The pocket was closed with deep interrupted and subcuticular continuous 3-0 Monocryl sutures. The port was flushed per protocol. The incisions were covered with Dermabond then covered with a sterile dressing. COMPLICATIONS: COMPLICATIONS None immediate IMPRESSION: Technically successful right IJ power-injectable port catheter placement. Ready for routine use. Electronically Signed   By: Lucrezia Europe M.D.   On: 10/10/2017 12:37   Ir Fluoro Guide Port Insertion Right  Result Date: 10/10/2017 CLINICAL DATA:  Hodgkin's lymphoma. Needs durable venous access for planned chemotherapy regimen. EXAM: TUNNELED PORT CATHETER PLACEMENT WITH ULTRASOUND AND FLUOROSCOPIC GUIDANCE FLUOROSCOPY TIME:  0.1 minute; 25 uGym2 DAP ANESTHESIA/SEDATION: Intravenous Fentanyl and Versed were administered as conscious sedation during continuous monitoring of the patient's level of consciousness and physiological / cardiorespiratory status by the radiology RN, with a total moderate sedation time of 12 minutes. TECHNIQUE: The procedure, risks, benefits, and alternatives were explained to the patient. Questions regarding the procedure were encouraged and answered. The patient understands and consents to the procedure. As antibiotic prophylaxis, Cleocin 600 mg was ordered pre-procedure and administered intravenously within one hour of incision. Patency of the right IJ vein was confirmed with ultrasound with image documentation. An appropriate skin site was determined. Skin site was marked. Region was  prepped using maximum barrier technique including cap and mask, sterile gown, sterile gloves, large sterile sheet, and Chlorhexidine as cutaneous antisepsis. The region was infiltrated locally with 1% lidocaine. Under real-time ultrasound guidance, the right IJ vein was accessed with a 21 gauge micropuncture needle; the needle tip within the vein was confirmed with ultrasound image documentation. Needle was exchanged over a 018 guidewire for transitional dilator which allowed passage of the [facility] wire into the IVC. Over this, the transitional dilator was exchanged for a 5 [place] MPA catheter. A small incision was made on the right anterior chest wall and a subcutaneous pocket fashioned. The power-injectable port was positioned and its catheter tunneled to the right IJ dermatotomy site. The MPA catheter was exchanged over an Amplatz wire for a peel-away sheath, through which the port catheter, which had been trimmed to the appropriate length, was advanced and positioned under fluoroscopy with its tip at the cavoatrial junction. Spot chest radiograph confirms good catheter position and no pneumothorax. The pocket was closed with deep interrupted and subcuticular continuous 3-0 Monocryl sutures. The port was flushed per protocol. The incisions were covered with Dermabond then covered with a sterile dressing. COMPLICATIONS: COMPLICATIONS None immediate IMPRESSION: Technically successful right IJ power-injectable port catheter placement. Ready for routine use. Electronically Signed   By: Lucrezia Europe M.D.   On:  10/10/2017 12:37    ASSESSMENT & PLAN:   72 y.o. female with  1. Stage IA Hodgkin's Lymphoma (mixed cellularity) No constitutional symptoms Sed rate elevated to 30 PET findings most consistent with Stage I disease; discussed that a small hypermetabolic nodule found in her liver has remain unchanged for 4 years and is not considered to be involved with her Hodgkin's lymphoma.  -Left cervical lymph nodes  are involved without further involvement.  Component     Latest Ref Rng & Units 08/29/2017  Retic Ct Pct     0.7 - 2.1 % 0.8  RBC.     3.70 - 5.45 MIL/uL 4.60  Retic Count, Absolute     33.7 - 90.7 K/uL 36.8  Sed Rate     0 - 22 mm/hr 30 (H)  LDH     125 - 245 U/L 179  HCV Ab     0.0 - 0.9 s/co ratio <0.1  Hepatitis B Surface Ag     Negative Negative  Hep B Core Ab, Tot     Negative Negative   PLAN -no prohibitive toxicity from 1st dose of chemotherapy (AVD) --Discussed pt labwork today 10/20/17; mild neutropenia noted with Neutro abs at 1.3k, blood chemistries are WNL. I expect these counts to normalize before C2.  -Recommended that while she receives chemotherapy, to eat popsicles and ice chips to reduce mouth sores, and using salt and baking soda rinse.  -Recommend that the pt walk 20-30 minutes each day and continue to eat and hydrate well.  -Discussed 3 cycles of AVD followed by either observation or involved-site radiation.  -We will continue to monitor her oral cavity to mitigate the risk of infection.   Please schedule C1D15 and C2 and C3 of treatment as ordered Labs q15days with each treatment RTC with Dr Irene Limbo on 5/20 with C2D1 of treatment    All of the patients questions were answered with apparent satisfaction. The patient knows to call the clinic with any problems, questions or concerns.  . The total time spent in the appointment was 15 minutes and more than 50% was on counseling and direct patient cares.     Sullivan Lone MD [place] AAHIVMS [facility] Hematology/Oncology Physician [facility]  (Office):       [phone] (Work cell):  [phone] (Fax):           [phone]  10/20/2017 11:52 AM  This document serves as a record of services personally performed by Sullivan Lone, MD. It was created on his behalf by Baldwin Jamaica, a trained medical scribe. The creation of this record is based on the scribe's personal observations and the provider's  statements to them.   .I have reviewed the above documentation for accuracy and completeness, and I agree with the above. Brunetta Genera MD MS

## 2017-10-20 ENCOUNTER — Ambulatory Visit: Payer: Medicare HMO | Admitting: Hematology

## 2017-10-20 ENCOUNTER — Encounter: Payer: Self-pay | Admitting: Hematology

## 2017-10-20 ENCOUNTER — Ambulatory Visit: Payer: Medicare HMO

## 2017-10-20 ENCOUNTER — Other Ambulatory Visit: Payer: Medicare HMO

## 2017-10-20 ENCOUNTER — Inpatient Hospital Stay: Payer: Medicare HMO

## 2017-10-20 ENCOUNTER — Inpatient Hospital Stay (HOSPITAL_BASED_OUTPATIENT_CLINIC_OR_DEPARTMENT_OTHER): Payer: Medicare HMO | Admitting: Hematology

## 2017-10-20 ENCOUNTER — Telehealth: Payer: Self-pay | Admitting: Hematology

## 2017-10-20 VITALS — BP 135/71 | HR 99 | Temp 97.8°F | Resp 17 | Ht 68.0 in | Wt 199.4 lb

## 2017-10-20 DIAGNOSIS — C8121 Mixed cellularity classical Hodgkin lymphoma, lymph nodes of head, face, and neck: Secondary | ICD-10-CM | POA: Diagnosis not present

## 2017-10-20 DIAGNOSIS — Z87891 Personal history of nicotine dependence: Secondary | ICD-10-CM

## 2017-10-20 DIAGNOSIS — Z5111 Encounter for antineoplastic chemotherapy: Secondary | ICD-10-CM

## 2017-10-20 DIAGNOSIS — D709 Neutropenia, unspecified: Secondary | ICD-10-CM

## 2017-10-20 DIAGNOSIS — C8198 Hodgkin lymphoma, unspecified, lymph nodes of multiple sites: Secondary | ICD-10-CM

## 2017-10-20 LAB — CMP (CANCER CENTER ONLY)
ALT: 32 U/L (ref 0–55)
AST: 25 U/L (ref 5–34)
Albumin: 3.9 g/dL (ref 3.5–5.0)
Alkaline Phosphatase: 75 U/L (ref 40–150)
Anion gap: 10 (ref 3–11)
BUN: 12 mg/dL (ref 7–26)
CO2: 24 mmol/L (ref 22–29)
Calcium: 10 mg/dL (ref 8.4–10.4)
Chloride: 101 mmol/L (ref 98–109)
Creatinine: 0.83 mg/dL (ref 0.60–1.10)
GFR, Est AFR Am: >60 mL/min (ref >60)
GFR, Estimated: >60 mL/min (ref >60)
Glucose, Bld: 94 mg/dL (ref 70–140)
Potassium: 3.7 mmol/L (ref 3.5–5.1)
Sodium: 135 mmol/L — ABNORMAL LOW (ref 136–145)
Total Bilirubin: 0.6 mg/dL (ref 0.2–1.2)
Total Protein: 7.2 g/dL (ref 6.4–8.3)

## 2017-10-20 LAB — CBC WITH DIFFERENTIAL (CANCER CENTER ONLY)
BASOS PCT: 0 %
Basophils Absolute: 0 10*3/uL (ref 0.0–0.1)
EOS ABS: 0.1 10*3/uL (ref 0.0–0.5)
EOS PCT: 3 %
HCT: 37.1 % (ref 34.8–46.6)
Hemoglobin: 12.2 g/dL (ref 11.6–15.9)
LYMPHS ABS: 1.2 10*3/uL (ref 0.9–3.3)
Lymphocytes Relative: 47 %
MCH: 27.9 pg (ref 25.1–34.0)
MCHC: 32.9 g/dL (ref 31.5–36.0)
MCV: 84.9 fL (ref 79.5–101.0)
Monocytes Absolute: 0 10*3/uL — ABNORMAL LOW (ref 0.1–0.9)
Monocytes Relative: 0 %
Neutro Abs: 1.3 10*3/uL — ABNORMAL LOW (ref 1.5–6.5)
Neutrophils Relative %: 50 %
Platelet Count: 222 10*3/uL (ref 145–400)
RBC: 4.37 MIL/uL (ref 3.70–5.45)
RDW: 13.6 % (ref 11.2–14.5)
WBC: 2.6 10*3/uL — AB (ref 3.9–10.3)

## 2017-10-20 LAB — URIC ACID: Uric Acid, Serum: 5.1 mg/dL (ref 2.6–7.4)

## 2017-10-20 LAB — MAGNESIUM: Magnesium: 2.1 mg/dL (ref 1.7–2.4)

## 2017-10-20 LAB — PHOSPHORUS: Phosphorus: 3.3 mg/dL (ref 2.5–4.6)

## 2017-10-20 NOTE — Telephone Encounter (Signed)
Appointments scheduled AVS/Calendar printed per 4/29 los °

## 2017-10-21 ENCOUNTER — Telehealth: Payer: Self-pay

## 2017-10-21 NOTE — Telephone Encounter (Signed)
Called patient and asked her to stop by scheduling to get a updated print out of her upcoming appointments. Patient also have [name]. Per 4/29 los

## 2017-10-22 ENCOUNTER — Telehealth: Payer: Self-pay

## 2017-10-22 NOTE — Telephone Encounter (Signed)
Pt called requesting medication recommendation for gas/bloating. Per Dr. Irene Limbo, pt can take maalox OTC. Pt also ok to use [facility] periodontal 5000, but has chosen to use ACT mouthwash at this time. Pt will notify dental office with any oral issues. Pt plans to go to store to obtain maalox for gas and bloating.

## 2017-10-27 ENCOUNTER — Encounter: Payer: Self-pay | Admitting: Hematology

## 2017-10-27 ENCOUNTER — Inpatient Hospital Stay: Payer: Medicare HMO | Admitting: Hematology

## 2017-10-27 ENCOUNTER — Ambulatory Visit: Payer: Medicare HMO

## 2017-10-27 ENCOUNTER — Inpatient Hospital Stay: Payer: Medicare HMO

## 2017-10-27 ENCOUNTER — Telehealth: Payer: Self-pay

## 2017-10-27 ENCOUNTER — Inpatient Hospital Stay: Payer: Medicare HMO | Attending: Hematology

## 2017-10-27 ENCOUNTER — Inpatient Hospital Stay: Payer: Medicare HMO | Admitting: Hematology and Oncology

## 2017-10-27 VITALS — BP 136/73 | HR 85 | Temp 98.5°F | Resp 18 | Ht 68.0 in | Wt 200.3 lb

## 2017-10-27 DIAGNOSIS — Z95828 Presence of other vascular implants and grafts: Secondary | ICD-10-CM

## 2017-10-27 DIAGNOSIS — Z5111 Encounter for antineoplastic chemotherapy: Secondary | ICD-10-CM | POA: Diagnosis not present

## 2017-10-27 DIAGNOSIS — C8121 Mixed cellularity classical Hodgkin lymphoma, lymph nodes of head, face, and neck: Secondary | ICD-10-CM | POA: Insufficient documentation

## 2017-10-27 DIAGNOSIS — D702 Other drug-induced agranulocytosis: Secondary | ICD-10-CM

## 2017-10-27 DIAGNOSIS — K769 Liver disease, unspecified: Secondary | ICD-10-CM | POA: Diagnosis not present

## 2017-10-27 DIAGNOSIS — Z87891 Personal history of nicotine dependence: Secondary | ICD-10-CM | POA: Diagnosis not present

## 2017-10-27 LAB — RETICULOCYTES
RBC.: 3.99 MIL/uL (ref 3.70–5.45)
RETIC COUNT ABSOLUTE: 75.8 10*3/uL (ref 33.7–90.7)
RETIC CT PCT: 1.9 % (ref 0.7–2.1)

## 2017-10-27 LAB — CMP (CANCER CENTER ONLY)
ALBUMIN: 3.4 g/dL — AB (ref 3.5–5.0)
ALT: 16 U/L (ref 0–55)
ANION GAP: 6 (ref 3–11)
AST: 19 U/L (ref 5–34)
Alkaline Phosphatase: 75 U/L (ref 40–150)
BUN: 8 mg/dL (ref 7–26)
CHLORIDE: 105 mmol/L (ref 98–109)
CO2: 26 mmol/L (ref 22–29)
Calcium: 9.3 mg/dL (ref 8.4–10.4)
Creatinine: 0.78 mg/dL (ref 0.60–1.10)
GFR, Estimated: >60 mL/min (ref >60)
GLUCOSE: 92 mg/dL (ref 70–140)
POTASSIUM: 3.7 mmol/L (ref 3.5–5.1)
SODIUM: 137 mmol/L (ref 136–145)
Total Bilirubin: 0.3 mg/dL (ref 0.2–1.2)
Total Protein: 6.8 g/dL (ref 6.4–8.3)

## 2017-10-27 LAB — CBC WITH DIFFERENTIAL (CANCER CENTER ONLY)
BASOS PCT: 2 %
Basophils Absolute: 0.1 10*3/uL (ref 0.0–0.1)
EOS ABS: 0 10*3/uL (ref 0.0–0.5)
EOS PCT: 1 %
HEMATOCRIT: 34.3 % — AB (ref 34.8–46.6)
HEMOGLOBIN: 11.2 g/dL — AB (ref 11.6–15.9)
LYMPHS PCT: 55 %
Lymphs Abs: 1.4 10*3/uL (ref 0.9–3.3)
MCH: 28.1 pg (ref 25.1–34.0)
MCHC: 32.7 g/dL (ref 31.5–36.0)
MCV: 86 fL (ref 79.5–101.0)
MONOS PCT: 36 %
Monocytes Absolute: 1 10*3/uL — ABNORMAL HIGH (ref 0.1–0.9)
NEUTROS PCT: 6 %
Neutro Abs: 0.2 10*3/uL — CL (ref 1.5–6.5)
Platelet Count: 321 10*3/uL (ref 145–400)
RBC: 3.99 MIL/uL (ref 3.70–5.45)
RDW: 14.1 % (ref 11.2–14.5)
WBC Count: 2.7 10*3/uL — ABNORMAL LOW (ref 3.9–10.3)

## 2017-10-27 MED ORDER — HEPARIN SOD (PORK) LOCK FLUSH 100 UNIT/ML IV SOLN
500.0000 [IU] | Freq: Once | INTRAVENOUS | Status: AC
  Administered 2017-10-27: 500 [IU]
  Filled 2017-10-27: qty 5

## 2017-10-27 MED ORDER — TBO-FILGRASTIM 480 MCG/0.8ML ~~LOC~~ SOSY
PREFILLED_SYRINGE | SUBCUTANEOUS | Status: AC
  Filled 2017-10-27: qty 0.8

## 2017-10-27 MED ORDER — SODIUM CHLORIDE 0.9% FLUSH
10.0000 mL | Freq: Once | INTRAVENOUS | Status: AC
  Administered 2017-10-27: 10 mL
  Filled 2017-10-27: qty 10

## 2017-10-27 MED ORDER — TBO-FILGRASTIM 480 MCG/0.8ML ~~LOC~~ SOSY
480.0000 ug | PREFILLED_SYRINGE | Freq: Once | SUBCUTANEOUS | Status: AC
  Administered 2017-10-27: 480 ug via SUBCUTANEOUS

## 2017-10-27 MED ORDER — FILGRASTIM 480 MCG/0.8ML IJ SOSY: 480.0000 ug | PREFILLED_SYRINGE | Freq: Once | INTRAMUSCULAR | Status: DC

## 2017-10-27 NOTE — Telephone Encounter (Signed)
Printed avs and calender of upcoming appointment. Per 5/6 los also added patient on for inj today.

## 2017-10-27 NOTE — Patient Instructions (Addendum)
Thank you for choosing [facility] to provide your oncology and hematology care.  To afford each patient quality time with our providers, please arrive 30 minutes before your scheduled appointment time.  If you arrive late for your appointment, you may be asked to reschedule.  We strive to give you quality time with our providers, and arriving late affects you and other patients whose appointments are after yours.   If you are a no show for multiple scheduled visits, you may be dismissed from the clinic at the providers discretion.    Again, thank you for choosing [facility], our hope is that these requests will decrease the amount of time that you wait before being seen by our physicians.  ______________________________________________________________________  Should you have questions after your visit to the [facility], please contact our office at [phone] between the hours of 8:30 and 4:30 p.m.    Voicemails left after 4:30p.m will not be returned until the following business day.    For prescription refill requests, please have your pharmacy contact us directly.  Please also try to allow 48 hours for prescription requests.    Please contact the scheduling department for questions regarding scheduling.  For scheduling of procedures such as PET scans, CT scans, MRI, Ultrasound, etc please contact central scheduling at [phone].    Resources For Cancer Patients and Caregivers:   [employer]:  A wonderful resource for patients and healthcare providers for information regarding your disease, ways to tract your treatment, what to expect, etc.     [place]:  [phone]  Can help patients locate various types of support and financial assistance  [facility]: 1-800-813-HOPE 367-423-1544) Provides financial assistance, online support groups, medication/co-pay assistance.    [place]:  ([phone] Where to apply for food  stamps, Medicaid, and utility assistance  [facility]ic[facility] Rights [facility]: 780 617 2639 Helps people with Medicare understand their rights and benefits, navigate the Medicare system, and secure the quality healthcare they deserve  [employer]: [employer]'s shared-ride transportation service for eligible riders who have a disability that prevents them from riding the fixed route bus.    For additional information on assistance programs please contact our social worker:   [place]:  [phone]           Tbo-Filgrastim injection (Granix) What is this medicine? TBO-FILGRASTIM (T B O fil GRA stim) is a granulocyte colony-stimulating factor that stimulates the growth of neutrophils, a type of white blood cell important in the body's fight against infection. It is used to reduce the incidence of fever and infection in patients with certain types of cancer who are receiving chemotherapy that affects the bone marrow. This medicine may be used for other purposes; ask your health care provider or pharmacist if you have questions. COMMON BRAND NAME(S): Granix What should I tell my health care provider before I take this medicine? They need to know if you have any of these conditions: -bone scan or tests planned -kidney disease -sickle cell anemia -an unusual or allergic reaction to tbo-filgrastim, filgrastim, pegfilgrastim, other medicines, foods, dyes, or preservatives -pregnant or trying to get pregnant -breast-feeding How should I use this medicine? This medicine is for injection under the skin. If you get this medicine at home, you will be taught how to prepare and give this medicine. Refer to the Instructions for Use that come with your medication packaging. Use exactly as directed. Take your medicine at regular intervals. Do  not take your medicine more often than directed. It is important that you put your used needles and syringes in a special sharps  container. Do not put them in a trash can. If you do not have a sharps container, call your pharmacist or healthcare provider to get one. Talk to your pediatrician regarding the use of this medicine in children. Special care may be needed. Overdosage: If you think you have taken too much of this medicine contact a poison control center or emergency room at once. NOTE: This medicine is only for you. Do not share this medicine with others. What if I miss a dose? It is important not to miss your dose. Call your doctor or health care professional if you miss a dose. What may interact with this medicine? This medicine may interact with the following medications: -medicines that may cause a release of neutrophils, such as lithium This list may not describe all possible interactions. Give your health care provider a list of all the medicines, herbs, non-prescription drugs, or dietary supplements you use. Also tell them if you smoke, drink alcohol, or use illegal drugs. Some items may interact with your medicine. What should I watch for while using this medicine? You may need blood work done while you are taking this medicine. What side effects may I notice from receiving this medicine? Side effects that you should report to your doctor or health care professional as soon as possible: -allergic reactions like skin rash, itching or hives, swelling of the face, lips, or tongue -blood in the urine -dark urine -dizziness -fast heartbeat -feeling faint -shortness of breath or breathing problems -signs and symptoms of infection like fever or chills; cough; or sore throat -signs and symptoms of kidney injury like trouble passing urine or change in the amount of urine -stomach or side pain, or pain at the shoulder -sweating -swelling of the legs, ankles, or abdomen -tiredness Side effects that usually do not require medical attention (report to your doctor or health care professional if they continue or  are bothersome): -bone pain -headache -muscle pain -vomiting This list may not describe all possible side effects. Call your doctor for medical advice about side effects. You may report side effects to FDA at 1-800-FDA-1088. Where should I keep my medicine? Keep out of the reach of children. Store in a refrigerator between 2 and 8 degrees C (36 and 46 degrees F). Keep in carton to protect from light. Throw away this medicine if it is left out of the refrigerator for more than 5 consecutive days. Throw away any unused medicine after the expiration date. NOTE: This sheet is a summary. It may not cover all possible information. If you have questions about this medicine, talk to your doctor, pharmacist, or health care provider.  2018 [employer] Standard (2015-07-31 19:07:04)

## 2017-10-27 NOTE — Progress Notes (Signed)
HEMATOLOGY/ONCOLOGY CLINIC NOTE  Date of Service: 10/27/17  Patient Care Team: Kathyrn Lass, MD as PCP - General (Family Medicine)  CHIEF COMPLAINTS/PURPOSE OF CONSULTATION:  F/u for continued mx of  Hodgkin's Lymphoma   HISTORY OF PRESENTING ILLNESS:   Jennifer Juarez is a wonderful 72 y.o. female who has been referred to [place] by Dr. Lattie Haw Miller/Courtney Rolland Porter PA_C  for evaluation and management of likely newly diagnosed Hodgkin's Lymphoma.   The pt reports that she is doing very well overall and notes that she is fully functional and does not have any significant chronic medical problems or any functional limitations.  She notes that on 06/26/17 she noticed a knot on the left side of her neck that subsequently precipitated her CT scan and Bx prior to seeing [place] today.   On 07/28/17 the pt had a CT Soft Tissue Neck revealing Enlarged lymph nodes in the left neck compatible with neoplasm.Enlarged lymph nodes in the left neck. Large left level 2 lymph node 23 x 30 mm. Multiple posterior lymph nodes are present measuring up to 10 mm. No enlarged lymph nodes in the right neck. Biopsy recommended. Attention left tonsil on direct mucosal inspection. Possible tonsillar carcinoma on the left. Lymphoma also in the differential.    Of note prior to the patient's visit, pt has had a Lymph node Needle/Core biopsy completed on 08/12/17 with results revealing ATYPICAL LYMPHOID PROLIFERATION. Immunohistochemical stains were performed including LCA, CD20, PAX-5, CD3, CD15, and CD30 with appropriate controls. The large atypical lymphoid-appearing cells are positive for CD30, CD15 and PAX-5 and negative for CD3, CD20 and LCA. The lymphocytic population in the background show a mixture of T and B-cells with predominance of T-cells. The overall findings are limited but atypical and worrisome for a lymphoproliferative process, particularly. classical Hodgkin lymphoma. Excisional biopsy is strongly  recommended.  Also on 08/12/17 the pt had a Tissue Flow Cytometry revealing NO MONOCLONAL B-CELL POPULATION OR ABNORMAL T-CELL PHENOTYPE IDENTIFIED.   Most recent CBC with Diff. results (07/15/17) revealed all values WNL.   She notes that she has had a viral infection in 2005 that resulted in a complete loss of hearing in her right ear. She notes that her left ear has had fluid build up and has resulted in significant loss of hearing, for which she uses hearing aids.  On review of systems, pt reports good energy levels, and denies fevers, chills, night sweats, unexpected weight loss, skin itching, rashes, soreness, sore throat, difficulties swallowing, back pains, abdominal pains, leg swelling, and any other symptoms.   On PMHx the pt reports arthritis, vertigo, polyps. She denies heart, lung, kidney or liver problems. She denies DM.  On Social Hx the pt notes having quit smoking when she was 57. She reports infrequent EOH consumption. She denies chemical or radiation exposure.  On Family Hx she reports that her mother had liver cancer. Her sister had breast cancer at 19 y/o and pancreatic cancer at 74 y/o.  Interval History:  [name] returns today regarding her classical Hodgkin's Lymphoma at [place]. The patient's last visit with [place] was on 10/20/17. The pt reports that she is doing well overall.   The pt reports that her mouth soreness has resolved. She also notes that her appetite has been very strong and has picked up a couple pounds. We discussed that she is   Lab results today (10/27/17) of CBC, CMP, and Reticulocytes is as follows: all values are WNL except for WBC at  2.7k, Hgb at 11.2, HCT at 34.3, Neutro Abs at 0.2k, Monocytes Abs at 1.0k, Albumin at 3.4.  On review of systems, pt reports increasing weight, strong appetite, and denies fevers, chills, mouth sores, and any other symptoms.    MEDICAL HISTORY:  Past Medical History:  Diagnosis Date  . BPPV (benign paroxysmal  positional vertigo)   . H/O seasonal allergies   . Hearing loss in right ear   . Left cervical lymphadenopathy   . Macular degeneration   . Obesity   . Osteopenia   . Retinal vein occlusion   . Vitamin D deficiency     SURGICAL HISTORY: Past Surgical History:  Procedure Laterality Date  . CHOLECYSTECTOMY    . IR FLUORO GUIDE PORT INSERTION RIGHT  10/10/2017  . IR US GUIDE VASC ACCESS RIGHT  10/10/2017  . MASS EXCISION Left 09/22/2017   Procedure: LEFT CERVICAL LYMPH NODE EXCISION;  Surgeon: Izora Gala, MD;  Location: [facility];  Service: ENT;  Laterality: Left;  . TUBAL LIGATION      SOCIAL HISTORY: Social History   Socioeconomic History  . Marital status: Single    Spouse name: Not on file  . Number of children: Not on file  . Years of education: Not on file  . Highest education level: Not on file  Occupational History  . Not on file  Social Needs  . Financial resource strain: Not on file  . Food insecurity:    Worry: Not on file    Inability: Not on file  . Transportation needs:    Medical: Not on file    Non-medical: Not on file  Tobacco Use  . Smoking status: Former Smoker    Last attempt to quit: 06/24/1988    Years since quitting: 29.3  . Smokeless tobacco: Never Used  Substance and Sexual Activity  . Alcohol use: No  . Drug use: No  . Sexual activity: Not on file  Lifestyle  . Physical activity:    Days per week: Not on file    Minutes per session: Not on file  . Stress: Not on file  Relationships  . Social connections:    Talks on phone: Not on file    Gets together: Not on file    Attends religious service: Not on file    Active member of club or organization: Not on file    Attends meetings of clubs or organizations: Not on file    Relationship status: Not on file  . Intimate partner violence:    Fear of current or ex partner: Not on file    Emotionally abused: Not on file    Physically abused: Not on file    Forced sexual  activity: Not on file  Other Topics Concern  . Not on file  Social History Narrative  . Not on file    FAMILY HISTORY: Family History  Problem Relation Age of Onset  . Cancer Mother        liver  . Heart attack Father   . Hypertension Father   . Diabetes Mellitus I Father   . Cancer Sister        pancreatic, breast  . Diabetes Mellitus I Sister   . Heart disease Brother   . Heart disease Brother   . Heart disease Brother   . Diabetes Mellitus I Sister   . Diabetes Mellitus I Sister     ALLERGIES:  is allergic to cephalexin; doxycycline; levofloxacin; prilosec [omeprazole]; and singulair [montelukast].  MEDICATIONS:  Current Outpatient Medications  Medication Sig Dispense Refill  . acetaminophen (TYLENOL) 500 MG tablet Take 500 mg by mouth every 8 (eight) hours as needed for mild pain or moderate pain.     Marland Kitchen dexamethasone (DECADRON) 4 MG tablet Take 2 tablets by mouth once a day on the day after chemotherapy and then take 2 tablets two times a day for 2 days. Take with food. 30 tablet 1  . lidocaine-prilocaine (EMLA) cream Apply to affected area once 30 g 3  . LORazepam (ATIVAN) 0.5 MG tablet Take 1 tablet (0.5 mg total) by mouth every 8 (eight) hours as needed for anxiety (nausea). 30 tablet 0  . Multiple Vitamins-Minerals (HM MULTIVITAMIN ADULT GUMMY PO) Take 2 each by mouth daily.    . ondansetron (ZOFRAN) 8 MG tablet Take 1 tablet (8 mg total) by mouth 2 (two) times daily as needed. Start on the third day after chemotherapy. 30 tablet 1  . polyethylene glycol (MIRALAX / GLYCOLAX) packet Take 17 g by mouth daily.    . prochlorperazine (COMPAZINE) 10 MG tablet Take 1 tablet (10 mg total) by mouth every 6 (six) hours as needed (Nausea or vomiting). 30 tablet 1  . promethazine (PHENERGAN) 25 MG suppository Place 1 suppository (25 mg total) rectally every 6 (six) hours as needed for nausea or vomiting. 12 suppository 1  . triamcinolone (NASACORT AQ) 55 MCG/ACT AERO nasal inhaler  Place 2 sprays into the nose daily.     No current facility-administered medications for this visit.     REVIEW OF SYSTEMS:    .10 Point review of Systems was done is negative except as noted above.  PHYSICAL EXAMINATION: ECOG PERFORMANCE STATUS: 1 .BP 136/73 (BP Location: Left Arm, Patient Position: Sitting)   Pulse 85   Temp 98.5 F (36.9 C) (Oral)   Resp 18   Ht 5\' 8"  (1.727 m)   Wt 200 lb 4.8 oz (90.9 kg)   SpO2 97%   BMI 30.46 kg/m  . GENERAL:alert, in no acute distress and comfortable SKIN: no acute rashes, no significant lesions EYES: conjunctiva are pink and non-injected, sclera anicteric OROPHARYNX: MMM, no exudates, no oropharyngeal erythema or ulceration NECK: supple, no JVD LYMPH:  no palpable lymphadenopathy in the cervical, axillary or inguinal regions LUNGS: clear to auscultation b/l with normal respiratory effort HEART: regular rate & rhythm ABDOMEN:  normoactive bowel sounds , non tender, not distended. Extremity: no pedal edema PSYCH: alert & oriented x 3 with fluent speech NEURO: no focal motor/sensory deficits   LABORATORY DATA:  I have reviewed the data as listed  . CBC Latest Ref Rng & Units 10/27/2017 10/20/2017 10/13/2017  WBC 3.9 - 10.3 K/uL 2.7(L) 2.6(L) 7.3  Hemoglobin 11.6 - 15.9 g/dL 11.2(L) 12.2 12.2  Hematocrit 34.8 - 46.6 % 34.3(L) 37.1 37.8  Platelets 145 - 400 K/uL 321 222 330  ANC 1.3k   CMP Latest Ref Rng & Units 10/27/2017 10/20/2017 10/13/2017  Glucose 70 - 140 mg/dL 92 94 94  BUN 7 - 26 mg/dL 8 12 8   Creatinine 0.60 - 1.10 mg/dL 0.78 0.83 0.84  Sodium 136 - 145 mmol/L 137 135(L) 141  Potassium 3.5 - 5.1 mmol/L 3.7 3.7 3.9  Chloride 98 - 109 mmol/L 105 101 110(H)  CO2 22 - 29 mmol/L 26 24 23   Calcium 8.4 - 10.4 mg/dL 9.3 10.0 10.1  Total Protein 6.4 - 8.3 g/dL 6.8 7.2 7.2  Total Bilirubin 0.2 - 1.2 mg/dL 0.3 0.6 0.4  Alkaline  Phos 40 - 150 U/L 75 75 83  AST 5 - 34 U/L 19 25 19   ALT 0 - 55 U/L 16 32 11   Component     Latest  Ref Rng & Units 08/29/2017  Retic Ct Pct     0.7 - 2.1 % 0.8  RBC.     3.70 - 5.45 MIL/uL 4.60  Retic Count, Absolute     33.7 - 90.7 K/uL 36.8  Sed Rate     0 - 22 mm/hr 30 (H)  LDH     125 - 245 U/L 179  HCV Ab     0.0 - 0.9 s/co ratio <0.1  Hepatitis B Surface Ag     Negative Negative  Hep B Core Ab, Tot     Negative Negative     08/12/17 Lymph Node Needle/core Biopsy:   08/12/17 Tissue Flow Cytometry:    09/22/17 Tissue Flow Cytometry    09/22/17 Lymph Node Pathology:   RADIOGRAPHIC STUDIES: I have personally reviewed the radiological images as listed and agreed with the findings in the report. Ir US Guide Vasc Access Right  Result Date: 10/10/2017 CLINICAL DATA:  Hodgkin's lymphoma. Needs durable venous access for planned chemotherapy regimen. EXAM: TUNNELED PORT CATHETER PLACEMENT WITH ULTRASOUND AND FLUOROSCOPIC GUIDANCE FLUOROSCOPY TIME:  0.1 minute; 25 uGym2 DAP ANESTHESIA/SEDATION: Intravenous Fentanyl and Versed were administered as conscious sedation during continuous monitoring of the patient's level of consciousness and physiological / cardiorespiratory status by the radiology RN, with a total moderate sedation time of 12 minutes. TECHNIQUE: The procedure, risks, benefits, and alternatives were explained to the patient. Questions regarding the procedure were encouraged and answered. The patient understands and consents to the procedure. As antibiotic prophylaxis, Cleocin 600 mg was ordered pre-procedure and administered intravenously within one hour of incision. Patency of the right IJ vein was confirmed with ultrasound with image documentation. An appropriate skin site was determined. Skin site was marked. Region was prepped using maximum barrier technique including cap and mask, sterile gown, sterile gloves, large sterile sheet, and Chlorhexidine as cutaneous antisepsis. The region was infiltrated locally with 1% lidocaine. Under real-time ultrasound guidance, the right  IJ vein was accessed with a 21 gauge micropuncture needle; the needle tip within the vein was confirmed with ultrasound image documentation. Needle was exchanged over a 018 guidewire for transitional dilator which allowed passage of the [facility] wire into the IVC. Over this, the transitional dilator was exchanged for a 5 [place] MPA catheter. A small incision was made on the right anterior chest wall and a subcutaneous pocket fashioned. The power-injectable port was positioned and its catheter tunneled to the right IJ dermatotomy site. The MPA catheter was exchanged over an Amplatz wire for a peel-away sheath, through which the port catheter, which had been trimmed to the appropriate length, was advanced and positioned under fluoroscopy with its tip at the cavoatrial junction. Spot chest radiograph confirms good catheter position and no pneumothorax. The pocket was closed with deep interrupted and subcuticular continuous 3-0 Monocryl sutures. The port was flushed per protocol. The incisions were covered with Dermabond then covered with a sterile dressing. COMPLICATIONS: COMPLICATIONS None immediate IMPRESSION: Technically successful right IJ power-injectable port catheter placement. Ready for routine use. Electronically Signed   By: Lucrezia Europe M.D.   On: 10/10/2017 12:37   Ir Fluoro Guide Port Insertion Right  Result Date: 10/10/2017 CLINICAL DATA:  Hodgkin's lymphoma. Needs durable venous access for planned chemotherapy regimen. EXAM: TUNNELED PORT CATHETER PLACEMENT WITH ULTRASOUND  AND FLUOROSCOPIC GUIDANCE FLUOROSCOPY TIME:  0.1 minute; 25 uGym2 DAP ANESTHESIA/SEDATION: Intravenous Fentanyl and Versed were administered as conscious sedation during continuous monitoring of the patient's level of consciousness and physiological / cardiorespiratory status by the radiology RN, with a total moderate sedation time of 12 minutes. TECHNIQUE: The procedure, risks, benefits, and alternatives were explained to the patient.  Questions regarding the procedure were encouraged and answered. The patient understands and consents to the procedure. As antibiotic prophylaxis, Cleocin 600 mg was ordered pre-procedure and administered intravenously within one hour of incision. Patency of the right IJ vein was confirmed with ultrasound with image documentation. An appropriate skin site was determined. Skin site was marked. Region was prepped using maximum barrier technique including cap and mask, sterile gown, sterile gloves, large sterile sheet, and Chlorhexidine as cutaneous antisepsis. The region was infiltrated locally with 1% lidocaine. Under real-time ultrasound guidance, the right IJ vein was accessed with a 21 gauge micropuncture needle; the needle tip within the vein was confirmed with ultrasound image documentation. Needle was exchanged over a 018 guidewire for transitional dilator which allowed passage of the [facility] wire into the IVC. Over this, the transitional dilator was exchanged for a 5 [place] MPA catheter. A small incision was made on the right anterior chest wall and a subcutaneous pocket fashioned. The power-injectable port was positioned and its catheter tunneled to the right IJ dermatotomy site. The MPA catheter was exchanged over an Amplatz wire for a peel-away sheath, through which the port catheter, which had been trimmed to the appropriate length, was advanced and positioned under fluoroscopy with its tip at the cavoatrial junction. Spot chest radiograph confirms good catheter position and no pneumothorax. The pocket was closed with deep interrupted and subcuticular continuous 3-0 Monocryl sutures. The port was flushed per protocol. The incisions were covered with Dermabond then covered with a sterile dressing. COMPLICATIONS: COMPLICATIONS None immediate IMPRESSION: Technically successful right IJ power-injectable port catheter placement. Ready for routine use. Electronically Signed   By: Lucrezia Europe M.D.   On: 10/10/2017  12:37    ASSESSMENT & PLAN:   72 y.o. female with  1. Stage IA Hodgkin's Lymphoma (mixed cellularity) No constitutional symptoms Sed rate elevated to 30 PET findings most consistent with Stage I disease; discussed that a small hypermetabolic nodule found in her liver has remain unchanged for 4 years and is not considered to be involved with her Hodgkin's lymphoma.  -Left cervical lymph nodes are involved without further involvement.  Component     Latest Ref Rng & Units 08/29/2017  Retic Ct Pct     0.7 - 2.1 % 0.8  RBC.     3.70 - 5.45 MIL/uL 4.60  Retic Count, Absolute     33.7 - 90.7 K/uL 36.8  Sed Rate     0 - 22 mm/hr 30 (H)  LDH     125 - 245 U/L 179  HCV Ab     0.0 - 0.9 s/co ratio <0.1  Hepatitis B Surface Ag     Negative Negative  Hep B Core Ab, Tot     Negative Negative   PLAN -Discussed pt labwork today, 10/27/17; neutropenia with ANC at 0.2k, blood counts and chemistries are otherwise stable.  -ANC at 200 is prohibitive from continuing chemotherapy today.  -Pt will immediately let us know if she develops any fevers or concerns of infection -Will need to use growth factor today, Neupogen -Delaying treatment for one week.  -After C2 will give 14 day  growth factor, Neulasta -Pt is okay to take tylenol for bone pains and she will let us know if she gets too uncomfortable after growth factors.  -Pt will avoid crowds and individuals with infections   -Recommended that while she receives chemotherapy, to eat popsicles and ice chips to reduce mouth sores, and using salt and baking soda rinse.  -Recommend that the pt walk 20-30 minutes each day and continue to eat and hydrate well.  -Discussed 3 cycles of AVD followed by either observation or involved-site radiation.    -Please reschedule AVD from today out 7-8 days and adjust future chemotherapy dates accordingly. Adding Neulasta with future chemotherapy cycles. -daily neupogen for 4 days from today -RTC with Dr Irene Limbo  in 7 days with labs    All of the patients questions were answered with apparent satisfaction. The patient knows to call the clinic with any problems, questions or concerns.  . The total time spent in the appointment was 20 minutes and more than 50% was on counseling and direct patient cares.      Sullivan Lone MD [place] AAHIVMS [facility] Hematology/Oncology Physician [facility]  (Office):       [phone] (Work cell):  [phone] (Fax):           [phone]  10/27/2017 10:47 AM  This document serves as a record of services personally performed by Sullivan Lone, MD. It was created on his behalf by Baldwin Jamaica, a trained medical scribe. The creation of this record is based on the scribe's personal observations and the provider's statements to them.   .I have reviewed the above documentation for accuracy and completeness, and I agree with the above. Brunetta Genera MD MS

## 2017-10-28 ENCOUNTER — Inpatient Hospital Stay: Payer: Medicare HMO

## 2017-10-28 DIAGNOSIS — Z5111 Encounter for antineoplastic chemotherapy: Secondary | ICD-10-CM | POA: Diagnosis not present

## 2017-10-28 DIAGNOSIS — C8121 Mixed cellularity classical Hodgkin lymphoma, lymph nodes of head, face, and neck: Secondary | ICD-10-CM

## 2017-10-28 DIAGNOSIS — Z95828 Presence of other vascular implants and grafts: Secondary | ICD-10-CM

## 2017-10-28 MED ORDER — TBO-FILGRASTIM 480 MCG/0.8ML ~~LOC~~ SOSY
480.0000 ug | PREFILLED_SYRINGE | Freq: Once | SUBCUTANEOUS | Status: AC
  Administered 2017-10-28: 480 ug via SUBCUTANEOUS

## 2017-10-28 MED ORDER — TBO-FILGRASTIM 480 MCG/0.8ML ~~LOC~~ SOSY
PREFILLED_SYRINGE | SUBCUTANEOUS | Status: AC
  Filled 2017-10-28: qty 0.8

## 2017-10-28 MED ORDER — FILGRASTIM 480 MCG/0.8ML IJ SOSY: 480.0000 ug | PREFILLED_SYRINGE | Freq: Once | INTRAMUSCULAR | Status: DC

## 2017-10-28 NOTE — Patient Instructions (Signed)
Tbo-Filgrastim injection What is this medicine? TBO-FILGRASTIM (T B O fil GRA stim) is a granulocyte colony-stimulating factor that stimulates the growth of neutrophils, a type of white blood cell important in the body's fight against infection. It is used to reduce the incidence of fever and infection in patients with certain types of cancer who are receiving chemotherapy that affects the bone marrow. This medicine may be used for other purposes; ask your health care provider or pharmacist if you have questions. COMMON BRAND NAME(S): Granix What should I tell my health care provider before I take this medicine? They need to know if you have any of these conditions: -bone scan or tests planned -kidney disease -sickle cell anemia -an unusual or allergic reaction to tbo-filgrastim, filgrastim, pegfilgrastim, other medicines, foods, dyes, or preservatives -pregnant or trying to get pregnant -breast-feeding How should I use this medicine? This medicine is for injection under the skin. If you get this medicine at home, you will be taught how to prepare and give this medicine. Refer to the Instructions for Use that come with your medication packaging. Use exactly as directed. Take your medicine at regular intervals. Do not take your medicine more often than directed. It is important that you put your used needles and syringes in a special sharps container. Do not put them in a trash can. If you do not have a sharps container, call your pharmacist or healthcare provider to get one. Talk to your pediatrician regarding the use of this medicine in children. Special care may be needed. Overdosage: If you think you have taken too much of this medicine contact a poison control center or emergency room at once. NOTE: This medicine is only for you. Do not share this medicine with others. What if I miss a dose? It is important not to miss your dose. Call your doctor or health care professional if you miss a  dose. What may interact with this medicine? This medicine may interact with the following medications: -medicines that may cause a release of neutrophils, such as lithium This list may not describe all possible interactions. Give your health care provider a list of all the medicines, herbs, non-prescription drugs, or dietary supplements you use. Also tell them if you smoke, drink alcohol, or use illegal drugs. Some items may interact with your medicine. What should I watch for while using this medicine? You may need blood work done while you are taking this medicine. What side effects may I notice from receiving this medicine? Side effects that you should report to your doctor or health care professional as soon as possible: -allergic reactions like skin rash, itching or hives, swelling of the face, lips, or tongue -blood in the urine -dark urine -dizziness -fast heartbeat -feeling faint -shortness of breath or breathing problems -signs and symptoms of infection like fever or chills; cough; or sore throat -signs and symptoms of kidney injury like trouble passing urine or change in the amount of urine -stomach or side pain, or pain at the shoulder -sweating -swelling of the legs, ankles, or abdomen -tiredness Side effects that usually do not require medical attention (report to your doctor or health care professional if they continue or are bothersome): -bone pain -headache -muscle pain -vomiting This list may not describe all possible side effects. Call your doctor for medical advice about side effects. You may report side effects to FDA at 1-800-FDA-1088. Where should I keep my medicine? Keep out of the reach of children. Store in a refrigerator between   2 and 8 degrees C (36 and 46 degrees F). Keep in carton to protect from light. Throw away this medicine if it is left out of the refrigerator for more than 5 consecutive days. Throw away any unused medicine after the expiration  date. NOTE: This sheet is a summary. It may not cover all possible information. If you have questions about this medicine, talk to your doctor, pharmacist, or health care provider.  2018 Elsevier/Gold Standard (2015-07-31 19:07:04)  

## 2017-10-29 ENCOUNTER — Inpatient Hospital Stay: Payer: Medicare HMO

## 2017-10-29 VITALS — BP 132/86 | HR 80 | Temp 98.6°F | Resp 18

## 2017-10-29 DIAGNOSIS — C8121 Mixed cellularity classical Hodgkin lymphoma, lymph nodes of head, face, and neck: Secondary | ICD-10-CM

## 2017-10-29 DIAGNOSIS — Z95828 Presence of other vascular implants and grafts: Secondary | ICD-10-CM

## 2017-10-29 DIAGNOSIS — Z5111 Encounter for antineoplastic chemotherapy: Secondary | ICD-10-CM | POA: Diagnosis not present

## 2017-10-29 MED ORDER — TBO-FILGRASTIM 480 MCG/0.8ML ~~LOC~~ SOSY
480.0000 ug | PREFILLED_SYRINGE | Freq: Once | SUBCUTANEOUS | Status: AC
  Administered 2017-10-29: 480 ug via SUBCUTANEOUS

## 2017-10-29 MED ORDER — TBO-FILGRASTIM 480 MCG/0.8ML ~~LOC~~ SOSY
PREFILLED_SYRINGE | SUBCUTANEOUS | Status: AC
  Filled 2017-10-29: qty 0.8

## 2017-10-29 NOTE — Patient Instructions (Signed)
Tbo-Filgrastim injection What is this medicine? TBO-FILGRASTIM (T B O fil GRA stim) is a granulocyte colony-stimulating factor that stimulates the growth of neutrophils, a type of white blood cell important in the body's fight against infection. It is used to reduce the incidence of fever and infection in patients with certain types of cancer who are receiving chemotherapy that affects the bone marrow. This medicine may be used for other purposes; ask your health care provider or pharmacist if you have questions. COMMON BRAND NAME(S): Granix What should I tell my health care provider before I take this medicine? They need to know if you have any of these conditions: -bone scan or tests planned -kidney disease -sickle cell anemia -an unusual or allergic reaction to tbo-filgrastim, filgrastim, pegfilgrastim, other medicines, foods, dyes, or preservatives -pregnant or trying to get pregnant -breast-feeding How should I use this medicine? This medicine is for injection under the skin. If you get this medicine at home, you will be taught how to prepare and give this medicine. Refer to the Instructions for Use that come with your medication packaging. Use exactly as directed. Take your medicine at regular intervals. Do not take your medicine more often than directed. It is important that you put your used needles and syringes in a special sharps container. Do not put them in a trash can. If you do not have a sharps container, call your pharmacist or healthcare provider to get one. Talk to your pediatrician regarding the use of this medicine in children. Special care may be needed. Overdosage: If you think you have taken too much of this medicine contact a poison control center or emergency room at once. NOTE: This medicine is only for you. Do not share this medicine with others. What if I miss a dose? It is important not to miss your dose. Call your doctor or health care professional if you miss a  dose. What may interact with this medicine? This medicine may interact with the following medications: -medicines that may cause a release of neutrophils, such as lithium This list may not describe all possible interactions. Give your health care provider a list of all the medicines, herbs, non-prescription drugs, or dietary supplements you use. Also tell them if you smoke, drink alcohol, or use illegal drugs. Some items may interact with your medicine. What should I watch for while using this medicine? You may need blood work done while you are taking this medicine. What side effects may I notice from receiving this medicine? Side effects that you should report to your doctor or health care professional as soon as possible: -allergic reactions like skin rash, itching or hives, swelling of the face, lips, or tongue -blood in the urine -dark urine -dizziness -fast heartbeat -feeling faint -shortness of breath or breathing problems -signs and symptoms of infection like fever or chills; cough; or sore throat -signs and symptoms of kidney injury like trouble passing urine or change in the amount of urine -stomach or side pain, or pain at the shoulder -sweating -swelling of the legs, ankles, or abdomen -tiredness Side effects that usually do not require medical attention (report to your doctor or health care professional if they continue or are bothersome): -bone pain -headache -muscle pain -vomiting This list may not describe all possible side effects. Call your doctor for medical advice about side effects. You may report side effects to FDA at 1-800-FDA-1088. Where should I keep my medicine? Keep out of the reach of children. Store in a refrigerator between   2 and 8 degrees C (36 and 46 degrees F). Keep in carton to protect from light. Throw away this medicine if it is left out of the refrigerator for more than 5 consecutive days. Throw away any unused medicine after the expiration  date. NOTE: This sheet is a summary. It may not cover all possible information. If you have questions about this medicine, talk to your doctor, pharmacist, or health care provider.  2018 Elsevier/Gold Standard (2015-07-31 19:07:04)  

## 2017-10-30 ENCOUNTER — Inpatient Hospital Stay: Payer: Medicare HMO

## 2017-10-30 VITALS — BP 150/76 | HR 75 | Temp 98.4°F | Resp 18

## 2017-10-30 DIAGNOSIS — C8121 Mixed cellularity classical Hodgkin lymphoma, lymph nodes of head, face, and neck: Secondary | ICD-10-CM

## 2017-10-30 DIAGNOSIS — Z95828 Presence of other vascular implants and grafts: Secondary | ICD-10-CM

## 2017-10-30 DIAGNOSIS — Z5111 Encounter for antineoplastic chemotherapy: Secondary | ICD-10-CM | POA: Diagnosis not present

## 2017-10-30 MED ORDER — TBO-FILGRASTIM 480 MCG/0.8ML ~~LOC~~ SOSY
480.0000 ug | PREFILLED_SYRINGE | Freq: Once | SUBCUTANEOUS | Status: AC
  Administered 2017-10-30: 480 ug via SUBCUTANEOUS

## 2017-10-30 MED ORDER — TBO-FILGRASTIM 480 MCG/0.8ML ~~LOC~~ SOSY
PREFILLED_SYRINGE | SUBCUTANEOUS | Status: AC
  Filled 2017-10-30: qty 0.8

## 2017-10-30 NOTE — Patient Instructions (Signed)
Tbo-Filgrastim injection What is this medicine? TBO-FILGRASTIM (T B O fil GRA stim) is a granulocyte colony-stimulating factor that stimulates the growth of neutrophils, a type of white blood cell important in the body's fight against infection. It is used to reduce the incidence of fever and infection in patients with certain types of cancer who are receiving chemotherapy that affects the bone marrow. This medicine may be used for other purposes; ask your health care provider or pharmacist if you have questions. COMMON BRAND NAME(S): Granix What should I tell my health care provider before I take this medicine? They need to know if you have any of these conditions: -bone scan or tests planned -kidney disease -sickle cell anemia -an unusual or allergic reaction to tbo-filgrastim, filgrastim, pegfilgrastim, other medicines, foods, dyes, or preservatives -pregnant or trying to get pregnant -breast-feeding How should I use this medicine? This medicine is for injection under the skin. If you get this medicine at home, you will be taught how to prepare and give this medicine. Refer to the Instructions for Use that come with your medication packaging. Use exactly as directed. Take your medicine at regular intervals. Do not take your medicine more often than directed. It is important that you put your used needles and syringes in a special sharps container. Do not put them in a trash can. If you do not have a sharps container, call your pharmacist or healthcare provider to get one. Talk to your pediatrician regarding the use of this medicine in children. Special care may be needed. Overdosage: If you think you have taken too much of this medicine contact a poison control center or emergency room at once. NOTE: This medicine is only for you. Do not share this medicine with others. What if I miss a dose? It is important not to miss your dose. Call your doctor or health care professional if you miss a  dose. What may interact with this medicine? This medicine may interact with the following medications: -medicines that may cause a release of neutrophils, such as lithium This list may not describe all possible interactions. Give your health care provider a list of all the medicines, herbs, non-prescription drugs, or dietary supplements you use. Also tell them if you smoke, drink alcohol, or use illegal drugs. Some items may interact with your medicine. What should I watch for while using this medicine? You may need blood work done while you are taking this medicine. What side effects may I notice from receiving this medicine? Side effects that you should report to your doctor or health care professional as soon as possible: -allergic reactions like skin rash, itching or hives, swelling of the face, lips, or tongue -blood in the urine -dark urine -dizziness -fast heartbeat -feeling faint -shortness of breath or breathing problems -signs and symptoms of infection like fever or chills; cough; or sore throat -signs and symptoms of kidney injury like trouble passing urine or change in the amount of urine -stomach or side pain, or pain at the shoulder -sweating -swelling of the legs, ankles, or abdomen -tiredness Side effects that usually do not require medical attention (report to your doctor or health care professional if they continue or are bothersome): -bone pain -headache -muscle pain -vomiting This list may not describe all possible side effects. Call your doctor for medical advice about side effects. You may report side effects to FDA at 1-800-FDA-1088. Where should I keep my medicine? Keep out of the reach of children. Store in a refrigerator between   2 and 8 degrees C (36 and 46 degrees F). Keep in carton to protect from light. Throw away this medicine if it is left out of the refrigerator for more than 5 consecutive days. Throw away any unused medicine after the expiration  date. NOTE: This sheet is a summary. It may not cover all possible information. If you have questions about this medicine, talk to your doctor, pharmacist, or health care provider.  2018 Elsevier/Gold Standard (2015-07-31 19:07:04)  

## 2017-11-04 ENCOUNTER — Inpatient Hospital Stay: Payer: Medicare HMO

## 2017-11-04 ENCOUNTER — Encounter: Payer: Self-pay | Admitting: Hematology

## 2017-11-04 ENCOUNTER — Inpatient Hospital Stay: Payer: Medicare HMO | Admitting: Hematology

## 2017-11-04 VITALS — BP 150/90 | HR 91 | Temp 98.4°F | Resp 17 | Ht 68.0 in | Wt 203.9 lb

## 2017-11-04 DIAGNOSIS — C8121 Mixed cellularity classical Hodgkin lymphoma, lymph nodes of head, face, and neck: Secondary | ICD-10-CM

## 2017-11-04 DIAGNOSIS — D702 Other drug-induced agranulocytosis: Secondary | ICD-10-CM

## 2017-11-04 DIAGNOSIS — Z5111 Encounter for antineoplastic chemotherapy: Secondary | ICD-10-CM | POA: Diagnosis not present

## 2017-11-04 DIAGNOSIS — Z87891 Personal history of nicotine dependence: Secondary | ICD-10-CM

## 2017-11-04 DIAGNOSIS — Z7189 Other specified counseling: Secondary | ICD-10-CM

## 2017-11-04 DIAGNOSIS — Z95828 Presence of other vascular implants and grafts: Secondary | ICD-10-CM

## 2017-11-04 LAB — CMP (CANCER CENTER ONLY)
ALK PHOS: 102 U/L (ref 40–150)
ALT: 13 U/L (ref 0–55)
ANION GAP: 7 (ref 3–11)
AST: 17 U/L (ref 5–34)
Albumin: 3.3 g/dL — ABNORMAL LOW (ref 3.5–5.0)
BUN: 9 mg/dL (ref 7–26)
CALCIUM: 9.6 mg/dL (ref 8.4–10.4)
CHLORIDE: 108 mmol/L (ref 98–109)
CO2: 24 mmol/L (ref 22–29)
Creatinine: 0.75 mg/dL (ref 0.60–1.10)
GFR, Est AFR Am: >60 mL/min (ref >60)
GFR, Estimated: >60 mL/min (ref >60)
Glucose, Bld: 99 mg/dL (ref 70–140)
Potassium: 3.8 mmol/L (ref 3.5–5.1)
SODIUM: 139 mmol/L (ref 136–145)
Total Bilirubin: 0.3 mg/dL (ref 0.2–1.2)
Total Protein: 6.8 g/dL (ref 6.4–8.3)

## 2017-11-04 LAB — CBC WITH DIFFERENTIAL (CANCER CENTER ONLY)
BASOS PCT: 0 %
Basophils Absolute: 0 10*3/uL (ref 0.0–0.1)
Eosinophils Absolute: 0.1 10*3/uL (ref 0.0–0.5)
Eosinophils Relative: 0 %
HCT: 34.8 % (ref 34.8–46.6)
HEMOGLOBIN: 11.1 g/dL — AB (ref 11.6–15.9)
Lymphocytes Relative: 17 %
Lymphs Abs: 2 10*3/uL (ref 0.9–3.3)
MCH: 27.5 pg (ref 25.1–34.0)
MCHC: 31.9 g/dL (ref 31.5–36.0)
MCV: 86.1 fL (ref 79.5–101.0)
MONOS PCT: 12 %
Monocytes Absolute: 1.4 10*3/uL — ABNORMAL HIGH (ref 0.1–0.9)
Neutro Abs: 8.3 10*3/uL — ABNORMAL HIGH (ref 1.5–6.5)
Neutrophils Relative %: 71 %
Platelet Count: 298 10*3/uL (ref 145–400)
RBC: 4.04 MIL/uL (ref 3.70–5.45)
RDW: 15.2 % — ABNORMAL HIGH (ref 11.2–14.5)
WBC Count: 11.8 10*3/uL — ABNORMAL HIGH (ref 3.9–10.3)

## 2017-11-04 LAB — RETICULOCYTES
RBC.: 4.04 MIL/uL (ref 3.70–5.45)
RETIC COUNT ABSOLUTE: 44.4 10*3/uL (ref 33.7–90.7)
Retic Ct Pct: 1.1 % (ref 0.7–2.1)

## 2017-11-04 MED ORDER — SODIUM CHLORIDE 0.9 % IV SOLN
375.0000 mg/m2 | Freq: Once | INTRAVENOUS | Status: AC
  Administered 2017-11-04: 800 mg via INTRAVENOUS
  Filled 2017-11-04: qty 80

## 2017-11-04 MED ORDER — PALONOSETRON HCL INJECTION 0.25 MG/5ML
INTRAVENOUS | Status: AC
  Filled 2017-11-04: qty 5

## 2017-11-04 MED ORDER — SODIUM CHLORIDE 0.9 % IV SOLN
Freq: Once | INTRAVENOUS | Status: AC
  Administered 2017-11-04: 10:00:00 via INTRAVENOUS

## 2017-11-04 MED ORDER — DOXORUBICIN HCL CHEMO IV INJECTION 2 MG/ML
25.0000 mg/m2 | Freq: Once | INTRAVENOUS | Status: AC
  Administered 2017-11-04: 54 mg via INTRAVENOUS
  Filled 2017-11-04: qty 27

## 2017-11-04 MED ORDER — PALONOSETRON HCL INJECTION 0.25 MG/5ML
0.2500 mg | Freq: Once | INTRAVENOUS | Status: AC
Start: 2017-11-04 — End: 2017-11-04
  Administered 2017-11-04: 0.25 mg via INTRAVENOUS

## 2017-11-04 MED ORDER — VINBLASTINE SULFATE CHEMO INJECTION 1 MG/ML
6.1000 mg/m2 | Freq: Once | INTRAVENOUS | Status: AC
  Administered 2017-11-04: 13 mg via INTRAVENOUS
  Filled 2017-11-04: qty 13

## 2017-11-04 MED ORDER — SODIUM CHLORIDE 0.9 % IV SOLN
Freq: Once | INTRAVENOUS | Status: AC
  Administered 2017-11-04: 10:00:00 via INTRAVENOUS
  Filled 2017-11-04: qty 5

## 2017-11-04 MED ORDER — SODIUM CHLORIDE 0.9% FLUSH
10.0000 mL | Freq: Once | INTRAVENOUS | Status: AC
  Administered 2017-11-04: 10 mL
  Filled 2017-11-04: qty 10

## 2017-11-04 MED ORDER — SODIUM CHLORIDE 0.9% FLUSH
10.0000 mL | INTRAVENOUS | Status: DC | PRN
  Administered 2017-11-04: 10 mL
  Filled 2017-11-04: qty 10

## 2017-11-04 MED ORDER — HEPARIN SOD (PORK) LOCK FLUSH 100 UNIT/ML IV SOLN
500.0000 [IU] | Freq: Once | INTRAVENOUS | Status: AC | PRN
  Administered 2017-11-04: 500 [IU]
  Filled 2017-11-04: qty 5

## 2017-11-04 NOTE — Progress Notes (Signed)
HEMATOLOGY/ONCOLOGY CLINIC NOTE  Date of Service: 11/04/2017   Patient Care Team: Kathyrn Lass, MD as PCP - General (Family Medicine)  CHIEF COMPLAINTS/PURPOSE OF CONSULTATION:  F/u for continued mx of  Hodgkin's Lymphoma   HISTORY OF PRESENTING ILLNESS:   Jennifer Juarez is a wonderful 72 y.o. female who has been referred to [place] by Dr. Lattie Haw Miller/Courtney Rolland Porter PA_C  for evaluation and management of likely newly diagnosed Hodgkin's Lymphoma.   The pt reports that she is doing very well overall and notes that she is fully functional and does not have any significant chronic medical problems or any functional limitations.  She notes that on 06/26/17 she noticed a knot on the left side of her neck that subsequently precipitated her CT scan and Bx prior to seeing [place] today.   On 07/28/17 the pt had a CT Soft Tissue Neck revealing Enlarged lymph nodes in the left neck compatible with neoplasm.Enlarged lymph nodes in the left neck. Large left level 2 lymph node 23 x 30 mm. Multiple posterior lymph nodes are present measuring up to 10 mm. No enlarged lymph nodes in the right neck. Biopsy recommended. Attention left tonsil on direct mucosal inspection. Possible tonsillar carcinoma on the left. Lymphoma also in the differential.    Of note prior to the patient's visit, pt has had a Lymph node Needle/Core biopsy completed on 08/12/17 with results revealing ATYPICAL LYMPHOID PROLIFERATION. Immunohistochemical stains were performed including LCA, CD20, PAX-5, CD3, CD15, and CD30 with appropriate controls. The large atypical lymphoid-appearing cells are positive for CD30, CD15 and PAX-5 and negative for CD3, CD20 and LCA. The lymphocytic population in the background show a mixture of T and B-cells with predominance of T-cells. The overall findings are limited but atypical and worrisome for a lymphoproliferative process, particularly. classical Hodgkin lymphoma. Excisional biopsy is strongly  recommended.  Also on 08/12/17 the pt had a Tissue Flow Cytometry revealing NO MONOCLONAL B-CELL POPULATION OR ABNORMAL T-CELL PHENOTYPE IDENTIFIED.   Most recent CBC with Diff. results (07/15/17) revealed all values WNL.   She notes that she has had a viral infection in 2005 that resulted in a complete loss of hearing in her right ear. She notes that her left ear has had fluid build up and has resulted in significant loss of hearing, for which she uses hearing aids.  On review of systems, pt reports good energy levels, and denies fevers, chills, night sweats, unexpected weight loss, skin itching, rashes, soreness, sore throat, difficulties swallowing, back pains, abdominal pains, leg swelling, and any other symptoms.   On PMHx the pt reports arthritis, vertigo, polyps. She denies heart, lung, kidney or liver problems. She denies DM.  On Social Hx the pt notes having quit smoking when she was 89. She reports infrequent EOH consumption. She denies chemical or radiation exposure.  On Family Hx she reports that her mother had liver cancer. Her sister had breast cancer at 106 y/o and pancreatic cancer at 74 y/o.  Interval History:   Jennifer Juarez returns today regarding her classical Hodgkin's Lymphoma and C1D15. She is accompanied by her family. She wanted to know is it fine that her Blood counts dropped so fast in response to treatment after cycle 1. It was discussed how her counts will likely react and why we are using a growth factor to balance her counts out.   On review of symptoms, pt notes she had vertigo a few days ago and she took ativan that helped her  and gave her relief. Vertigo occurs every few months. She has been able to manage it. She will continue ativan as needed. She notes she is doing well and eating well and able to gain weight. She denies mouth sores. She eats ice chips and salt mouth watch to help prevent mouth sores. She denies fever, chill or new concerning symptoms. She notes  swelling and mild numbness on her right lower leg. She denies change in her gait from this.    MEDICAL HISTORY:  Past Medical History:  Diagnosis Date  . BPPV (benign paroxysmal positional vertigo)   . H/O seasonal allergies   . Hearing loss in right ear   . Left cervical lymphadenopathy   . Macular degeneration   . Obesity   . Osteopenia   . Retinal vein occlusion   . Vitamin D deficiency     SURGICAL HISTORY: Past Surgical History:  Procedure Laterality Date  . CHOLECYSTECTOMY    . IR FLUORO GUIDE PORT INSERTION RIGHT  10/10/2017  . IR US GUIDE VASC ACCESS RIGHT  10/10/2017  . MASS EXCISION Left 09/22/2017   Procedure: LEFT CERVICAL LYMPH NODE EXCISION;  Surgeon: Izora Gala, MD;  Location: [facility];  Service: ENT;  Laterality: Left;  . TUBAL LIGATION      SOCIAL HISTORY: Social History   Socioeconomic History  . Marital status: Single    Spouse name: Not on file  . Number of children: Not on file  . Years of education: Not on file  . Highest education level: Not on file  Occupational History  . Not on file  Social Needs  . Financial resource strain: Not on file  . Food insecurity:    Worry: Not on file    Inability: Not on file  . Transportation needs:    Medical: Not on file    Non-medical: Not on file  Tobacco Use  . Smoking status: Former Smoker    Last attempt to quit: 06/24/1988    Years since quitting: 29.3  . Smokeless tobacco: Never Used  Substance and Sexual Activity  . Alcohol use: No  . Drug use: No  . Sexual activity: Not on file  Lifestyle  . Physical activity:    Days per week: Not on file    Minutes per session: Not on file  . Stress: Not on file  Relationships  . Social connections:    Talks on phone: Not on file    Gets together: Not on file    Attends religious service: Not on file    Active member of club or organization: Not on file    Attends meetings of clubs or organizations: Not on file    Relationship status:  Not on file  . Intimate partner violence:    Fear of current or ex partner: Not on file    Emotionally abused: Not on file    Physically abused: Not on file    Forced sexual activity: Not on file  Other Topics Concern  . Not on file  Social History Narrative  . Not on file    FAMILY HISTORY: Family History  Problem Relation Age of Onset  . Cancer Mother        liver  . Heart attack Father   . Hypertension Father   . Diabetes Mellitus I Father   . Cancer Sister        pancreatic, breast  . Diabetes Mellitus I Sister   . Heart disease Brother   .  Heart disease Brother   . Heart disease Brother   . Diabetes Mellitus I Sister   . Diabetes Mellitus I Sister     ALLERGIES:  is allergic to cephalexin; doxycycline; levofloxacin; prilosec [omeprazole]; and singulair [montelukast].  MEDICATIONS:  Current Outpatient Medications  Medication Sig Dispense Refill  . acetaminophen (TYLENOL) 500 MG tablet Take 500 mg by mouth every 8 (eight) hours as needed for mild pain or moderate pain.     Marland Kitchen dexamethasone (DECADRON) 4 MG tablet Take 2 tablets by mouth once a day on the day after chemotherapy and then take 2 tablets two times a day for 2 days. Take with food. 30 tablet 1  . lidocaine-prilocaine (EMLA) cream Apply to affected area once 30 g 3  . LORazepam (ATIVAN) 0.5 MG tablet Take 1 tablet (0.5 mg total) by mouth every 8 (eight) hours as needed for anxiety (nausea). 30 tablet 0  . Multiple Vitamins-Minerals (HM MULTIVITAMIN ADULT GUMMY PO) Take 2 each by mouth daily.    . ondansetron (ZOFRAN) 8 MG tablet Take 1 tablet (8 mg total) by mouth 2 (two) times daily as needed. Start on the third day after chemotherapy. 30 tablet 1  . polyethylene glycol (MIRALAX / GLYCOLAX) packet Take 17 g by mouth daily.    . prochlorperazine (COMPAZINE) 10 MG tablet Take 1 tablet (10 mg total) by mouth every 6 (six) hours as needed (Nausea or vomiting). 30 tablet 1  . promethazine (PHENERGAN) 25 MG  suppository Place 1 suppository (25 mg total) rectally every 6 (six) hours as needed for nausea or vomiting. 12 suppository 1  . triamcinolone (NASACORT AQ) 55 MCG/ACT AERO nasal inhaler Place 2 sprays into the nose daily.     No current facility-administered medications for this visit.     REVIEW OF SYSTEMS:   .10 Point review of Systems was done is negative except as noted above.   PHYSICAL EXAMINATION: ECOG PERFORMANCE STATUS: 1 .BP (!) 150/90 (BP Location: Left Arm, Patient Position: Sitting) Comment: nurse  aware of bp  Pulse 91   Temp 98.4 F (36.9 C) (Oral)   Resp 17   Ht 5\' 8"  (1.727 m)   Wt 203 lb 14.4 oz (92.5 kg)   SpO2 98%   BMI 31.00 kg/m   . GENERAL:alert, in no acute distress and comfortable SKIN: no acute rashes, no significant lesions EYES: conjunctiva are pink and non-injected, sclera anicteric OROPHARYNX: MMM, no exudates, no oropharyngeal erythema or ulceration NECK: supple, no JVD LYMPH:  no palpable lymphadenopathy in the cervical, axillary or inguinal regions LUNGS: clear to auscultation b/l with normal respiratory effort HEART: regular rate & rhythm ABDOMEN:  normoactive bowel sounds , non tender, not distended. Extremity: no pedal edema PSYCH: alert & oriented x 3 with fluent speech NEURO: no focal motor/sensory deficits   LABORATORY DATA:  I have reviewed the data as listed  . CBC Latest Ref Rng & Units 11/04/2017 10/27/2017 10/20/2017  WBC 3.9 - 10.3 K/uL 11.8(H) 2.7(L) 2.6(L)  Hemoglobin 11.6 - 15.9 g/dL 11.1(L) 11.2(L) 12.2  Hematocrit 34.8 - 46.6 % 34.8 34.3(L) 37.1  Platelets 145 - 400 K/uL 298 321 222  ANC 8.3   CMP Latest Ref Rng & Units 11/04/2017  Glucose 65 - 99 mg/dL 99  BUN 6 - 20 mg/dL 9  Creatinine 0.44 - 1.00 mg/dL 0.75  Sodium 135 - 145 mmol/L 139  Potassium 3.5 - 5.1 mmol/L 3.8  Chloride 101 - 111 mmol/L 108  CO2 22 - 32 mmol/L  24  Calcium 8.9 - 10.3 mg/dL 9.6  Total Protein 6.5 - 8.1 g/dL 6.8  Total Bilirubin 0.3 - 1.2  mg/dL 0.3  Alkaline Phos 38 - 126 U/L 102  AST 15 - 41 U/L 17  ALT 14 - 54 U/L 13   Component     Latest Ref Rng & Units 08/29/2017  Retic Ct Pct     0.7 - 2.1 % 0.8  RBC.     3.70 - 5.45 MIL/uL 4.60  Retic Count, Absolute     33.7 - 90.7 K/uL 36.8  Sed Rate     0 - 22 mm/hr 30 (H)  LDH     125 - 245 U/L 179  HCV Ab     0.0 - 0.9 s/co ratio <0.1  Hepatitis B Surface Ag     Negative Negative  Hep B Core Ab, Tot     Negative Negative     08/12/17 Lymph Node Needle/core Biopsy:   08/12/17 Tissue Flow Cytometry:    09/22/17 Tissue Flow Cytometry    09/22/17 Lymph Node Pathology:   RADIOGRAPHIC STUDIES: I have personally reviewed the radiological images as listed and agreed with the findings in the report. Ir US Guide Vasc Access Right  Result Date: 10/10/2017 CLINICAL DATA:  Hodgkin's lymphoma. Needs durable venous access for planned chemotherapy regimen. EXAM: TUNNELED PORT CATHETER PLACEMENT WITH ULTRASOUND AND FLUOROSCOPIC GUIDANCE FLUOROSCOPY TIME:  0.1 minute; 25 uGym2 DAP ANESTHESIA/SEDATION: Intravenous Fentanyl and Versed were administered as conscious sedation during continuous monitoring of the patient's level of consciousness and physiological / cardiorespiratory status by the radiology RN, with a total moderate sedation time of 12 minutes. TECHNIQUE: The procedure, risks, benefits, and alternatives were explained to the patient. Questions regarding the procedure were encouraged and answered. The patient understands and consents to the procedure. As antibiotic prophylaxis, Cleocin 600 mg was ordered pre-procedure and administered intravenously within one hour of incision. Patency of the right IJ vein was confirmed with ultrasound with image documentation. An appropriate skin site was determined. Skin site was marked. Region was prepped using maximum barrier technique including cap and mask, sterile gown, sterile gloves, large sterile sheet, and Chlorhexidine as cutaneous  antisepsis. The region was infiltrated locally with 1% lidocaine. Under real-time ultrasound guidance, the right IJ vein was accessed with a 21 gauge micropuncture needle; the needle tip within the vein was confirmed with ultrasound image documentation. Needle was exchanged over a 018 guidewire for transitional dilator which allowed passage of the [facility] wire into the IVC. Over this, the transitional dilator was exchanged for a 5 [place] MPA catheter. A small incision was made on the right anterior chest wall and a subcutaneous pocket fashioned. The power-injectable port was positioned and its catheter tunneled to the right IJ dermatotomy site. The MPA catheter was exchanged over an Amplatz wire for a peel-away sheath, through which the port catheter, which had been trimmed to the appropriate length, was advanced and positioned under fluoroscopy with its tip at the cavoatrial junction. Spot chest radiograph confirms good catheter position and no pneumothorax. The pocket was closed with deep interrupted and subcuticular continuous 3-0 Monocryl sutures. The port was flushed per protocol. The incisions were covered with Dermabond then covered with a sterile dressing. COMPLICATIONS: COMPLICATIONS None immediate IMPRESSION: Technically successful right IJ power-injectable port catheter placement. Ready for routine use. Electronically Signed   By: Lucrezia Europe M.D.   On: 10/10/2017 12:37   Ir Fluoro Guide Port Insertion Right  Result Date: 10/10/2017  CLINICAL DATA:  Hodgkin's lymphoma. Needs durable venous access for planned chemotherapy regimen. EXAM: TUNNELED PORT CATHETER PLACEMENT WITH ULTRASOUND AND FLUOROSCOPIC GUIDANCE FLUOROSCOPY TIME:  0.1 minute; 25 uGym2 DAP ANESTHESIA/SEDATION: Intravenous Fentanyl and Versed were administered as conscious sedation during continuous monitoring of the patient's level of consciousness and physiological / cardiorespiratory status by the radiology RN, with a total moderate  sedation time of 12 minutes. TECHNIQUE: The procedure, risks, benefits, and alternatives were explained to the patient. Questions regarding the procedure were encouraged and answered. The patient understands and consents to the procedure. As antibiotic prophylaxis, Cleocin 600 mg was ordered pre-procedure and administered intravenously within one hour of incision. Patency of the right IJ vein was confirmed with ultrasound with image documentation. An appropriate skin site was determined. Skin site was marked. Region was prepped using maximum barrier technique including cap and mask, sterile gown, sterile gloves, large sterile sheet, and Chlorhexidine as cutaneous antisepsis. The region was infiltrated locally with 1% lidocaine. Under real-time ultrasound guidance, the right IJ vein was accessed with a 21 gauge micropuncture needle; the needle tip within the vein was confirmed with ultrasound image documentation. Needle was exchanged over a 018 guidewire for transitional dilator which allowed passage of the [facility] wire into the IVC. Over this, the transitional dilator was exchanged for a 5 [place] MPA catheter. A small incision was made on the right anterior chest wall and a subcutaneous pocket fashioned. The power-injectable port was positioned and its catheter tunneled to the right IJ dermatotomy site. The MPA catheter was exchanged over an Amplatz wire for a peel-away sheath, through which the port catheter, which had been trimmed to the appropriate length, was advanced and positioned under fluoroscopy with its tip at the cavoatrial junction. Spot chest radiograph confirms good catheter position and no pneumothorax. The pocket was closed with deep interrupted and subcuticular continuous 3-0 Monocryl sutures. The port was flushed per protocol. The incisions were covered with Dermabond then covered with a sterile dressing. COMPLICATIONS: COMPLICATIONS None immediate IMPRESSION: Technically successful right IJ  power-injectable port catheter placement. Ready for routine use. Electronically Signed   By: Lucrezia Europe M.D.   On: 10/10/2017 12:37    ASSESSMENT & PLAN:   72 y.o. female with  1. Stage IA Hodgkin's Lymphoma (mixed cellularity) No constitutional symptoms Sed rate elevated to 30 PET findings most consistent with Stage I disease; discussed that a small hypermetabolic nodule found in her liver has remain unchanged for 4 years and is not considered to be involved with her Hodgkin's lymphoma.  -Left cervical lymph nodes are involved without further involvement.  Component     Latest Ref Rng & Units 08/29/2017  Retic Ct Pct     0.7 - 2.1 % 0.8  RBC.     3.70 - 5.45 MIL/uL 4.60  Retic Count, Absolute     33.7 - 90.7 K/uL 36.8  Sed Rate     0 - 22 mm/hr 30 (H)  LDH     125 - 245 U/L 179  HCV Ab     0.0 - 0.9 s/co ratio <0.1  Hepatitis B Surface Ag     Negative Negative  Hep B Core Ab, Tot     Negative Negative    PLAN -I discussed her blood work today, her WBC at 11.8, ANC at 8.3 and HG at 11.1. Her counts did bounce back from the drop after chemotherapy.  -I explained balancing her chemo with growth factor, granix. If her counts are not  able to adjust with the granix we may reduce her chemo or give time for her counts to recover.  -I discussed her options for growth factor are limited by her insurance. I am working to see if she is eligible for neulasta onpro patch coverage by her insurance so she does not have to return for injections.  -Her labs are adequate to proceed with cycle 1 Day 15  chemotherapy today.  -Pt will immediately let us know if she develops any fevers or concerns of infection -Pt is okay to take tylenol for bone pains and she will let us know if she gets too uncomfortable after growth factors.  -Pt will avoid crowds and individuals with infections   -Recommended that while she receives chemotherapy, to eat popsicles and ice chips to reduce mouth sores, and using  salt and baking soda rinse.  -Recommend that the pt walk 20-30 minutes each day and continue to eat and hydrate well.  -Previously discussed 3 cycles of AVD followed by either observation or involved-site radiation.  -She is fine to continue ativan as needed for her occasional vertigo.   Please schedule C2 of AVD treatment with G-CSF appointment the day after each chemotherapy G-CSF injection appointment tomorrow RTC with Dr Irene Limbo in 2 weeks with labs with C2D1 of treatment   All of the patients questions were answered with apparent satisfaction. The patient knows to call the clinic with any problems, questions or concerns.  The total time spent in the appointment was 25 minutes and more than 50% was on counseling and direct patient cares.   Sullivan Lone MD [place] AAHIVMS [facility] Hematology/Oncology Physician [facility]  (Office):       [phone] (Work cell):  [phone] (Fax):           [phone]  11/04/2017 9:05 AM  This document serves as a record of services personally performed by Sullivan Lone, MD. It was created on his behalf by Joslyn Devon, a trained medical scribe. The creation of this record is based on the scribe's personal observations and the provider's statements to them.    .I have reviewed the above documentation for accuracy and completeness, and I agree with the above.    Brunetta Genera MD MS

## 2017-11-04 NOTE — Patient Instructions (Signed)
[facility] Discharge Instructions for Patients Receiving Chemotherapy  Today you received the following chemotherapy agents: Adriamycin, Vinblastine, and Dacarbazine.  To help prevent nausea and vomiting after your treatment, we encourage you to take your nausea medication as directed.   If you develop nausea and vomiting that is not controlled by your nausea medication, call the clinic.   BELOW ARE SYMPTOMS THAT SHOULD BE REPORTED IMMEDIATELY:  *FEVER GREATER THAN 100.5 F  *CHILLS WITH OR WITHOUT FEVER  NAUSEA AND VOMITING THAT IS NOT CONTROLLED WITH YOUR NAUSEA MEDICATION  *UNUSUAL SHORTNESS OF BREATH  *UNUSUAL BRUISING OR BLEEDING  TENDERNESS IN MOUTH AND THROAT WITH OR WITHOUT PRESENCE OF ULCERS  *URINARY PROBLEMS  *BOWEL PROBLEMS  UNUSUAL RASH Items with * indicate a potential emergency and should be followed up as soon as possible.  Feel free to call the clinic should you have any questions or concerns. The clinic phone number is [phone].  Please show the CHEMO ALERT CARD at check-in to the Emergency Department and triage nurse.   

## 2017-11-06 ENCOUNTER — Inpatient Hospital Stay: Payer: Medicare HMO

## 2017-11-06 ENCOUNTER — Telehealth: Payer: Self-pay

## 2017-11-06 DIAGNOSIS — Z5111 Encounter for antineoplastic chemotherapy: Secondary | ICD-10-CM | POA: Diagnosis not present

## 2017-11-06 MED ORDER — PEGFILGRASTIM-CBQV 6 MG/0.6ML ~~LOC~~ SOSY
PREFILLED_SYRINGE | SUBCUTANEOUS | Status: AC
  Filled 2017-11-06: qty 0.6

## 2017-11-06 MED ORDER — PEGFILGRASTIM-CBQV 6 MG/0.6ML ~~LOC~~ SOSY
6.0000 mg | PREFILLED_SYRINGE | Freq: Once | SUBCUTANEOUS | Status: AC
  Administered 2017-11-06: 6 mg via SUBCUTANEOUS

## 2017-11-06 NOTE — Patient Instructions (Signed)
Pegfilgrastim injection What is this medicine? PEGFILGRASTIM (PEG fil gra stim) is a long-acting granulocyte colony-stimulating factor that stimulates the growth of neutrophils, a type of white blood cell important in the body's fight against infection. It is used to reduce the incidence of fever and infection in patients with certain types of cancer who are receiving chemotherapy that affects the bone marrow, and to increase survival after being exposed to high doses of radiation. This medicine may be used for other purposes; ask your health care provider or pharmacist if you have questions. COMMON BRAND NAME(S): Neulasta What should I tell my health care provider before I take this medicine? They need to know if you have any of these conditions: -kidney disease -latex allergy -ongoing radiation therapy -sickle cell disease -skin reactions to acrylic adhesives (On-Body Injector only) -an unusual or allergic reaction to pegfilgrastim, filgrastim, other medicines, foods, dyes, or preservatives -pregnant or trying to get pregnant -breast-feeding How should I use this medicine? This medicine is for injection under the skin. If you get this medicine at home, you will be taught how to prepare and give the pre-filled syringe or how to use the On-body Injector. Refer to the patient Instructions for Use for detailed instructions. Use exactly as directed. Tell your healthcare provider immediately if you suspect that the On-body Injector may not have performed as intended or if you suspect the use of the On-body Injector resulted in a missed or partial dose. It is important that you put your used needles and syringes in a special sharps container. Do not put them in a trash can. If you do not have a sharps container, call your pharmacist or healthcare provider to get one. Talk to your pediatrician regarding the use of this medicine in children. While this drug may be prescribed for selected conditions,  precautions do apply. Overdosage: If you think you have taken too much of this medicine contact a poison control center or emergency room at once. NOTE: This medicine is only for you. Do not share this medicine with others. What if I miss a dose? It is important not to miss your dose. Call your doctor or health care professional if you miss your dose. If you miss a dose due to an On-body Injector failure or leakage, a new dose should be administered as soon as possible using a single prefilled syringe for manual use. What may interact with this medicine? Interactions have not been studied. Give your health care provider a list of all the medicines, herbs, non-prescription drugs, or dietary supplements you use. Also tell them if you smoke, drink alcohol, or use illegal drugs. Some items may interact with your medicine. This list may not describe all possible interactions. Give your health care provider a list of all the medicines, herbs, non-prescription drugs, or dietary supplements you use. Also tell them if you smoke, drink alcohol, or use illegal drugs. Some items may interact with your medicine. What should I watch for while using this medicine? You may need blood work done while you are taking this medicine. If you are going to need a MRI, CT scan, or other procedure, tell your doctor that you are using this medicine (On-Body Injector only). What side effects may I notice from receiving this medicine? Side effects that you should report to your doctor or health care professional as soon as possible: -allergic reactions like skin rash, itching or hives, swelling of the face, lips, or tongue -dizziness -fever -pain, redness, or irritation at site   where injected -pinpoint red spots on the skin -red or dark-brown urine -shortness of breath or breathing problems -stomach or side pain, or pain at the shoulder -swelling -tiredness -trouble passing urine or change in the amount of urine Side  effects that usually do not require medical attention (report to your doctor or health care professional if they continue or are bothersome): -bone pain -muscle pain This list may not describe all possible side effects. Call your doctor for medical advice about side effects. You may report side effects to FDA at 1-800-FDA-1088. Where should I keep my medicine? Keep out of the reach of children. Store pre-filled syringes in a refrigerator between 2 and 8 degrees C (36 and 46 degrees F). Do not freeze. Keep in carton to protect from light. Throw away this medicine if it is left out of the refrigerator for more than 48 hours. Throw away any unused medicine after the expiration date. NOTE: This sheet is a summary. It may not cover all possible information. If you have questions about this medicine, talk to your doctor, pharmacist, or health care provider.  2018 Elsevier/Gold Standard (2016-06-06 12:58:03)  

## 2017-11-06 NOTE — Telephone Encounter (Signed)
[employer] at [phone] regarding [place] authorization. Medication was approved with the dates (11/05/17-05/08/18) and authorization number: [id number]. Spoke with employee, Colletta [place]. Pt received udenyca injection this morning.

## 2017-11-09 ENCOUNTER — Observation Stay (HOSPITAL_COMMUNITY)
Admission: EM | Admit: 2017-11-09 | Discharge: 2017-11-10 | Disposition: A | Discharge status: Home / Self Care | Payer: Medicare HMO | Attending: Internal Medicine | Admitting: Internal Medicine

## 2017-11-09 ENCOUNTER — Emergency Department (HOSPITAL_COMMUNITY): Payer: Medicare HMO

## 2017-11-09 ENCOUNTER — Other Ambulatory Visit: Payer: Self-pay

## 2017-11-09 ENCOUNTER — Encounter (HOSPITAL_COMMUNITY): Payer: Self-pay | Admitting: Emergency Medicine

## 2017-11-09 DIAGNOSIS — Z95828 Presence of other vascular implants and grafts: Secondary | ICD-10-CM | POA: Diagnosis not present

## 2017-11-09 DIAGNOSIS — Z803 Family history of malignant neoplasm of breast: Secondary | ICD-10-CM | POA: Insufficient documentation

## 2017-11-09 DIAGNOSIS — Z9049 Acquired absence of other specified parts of digestive tract: Secondary | ICD-10-CM | POA: Diagnosis not present

## 2017-11-09 DIAGNOSIS — Z6830 Body mass index (BMI) 30.0-30.9, adult: Secondary | ICD-10-CM | POA: Insufficient documentation

## 2017-11-09 DIAGNOSIS — D638 Anemia in other chronic diseases classified elsewhere: Secondary | ICD-10-CM | POA: Insufficient documentation

## 2017-11-09 DIAGNOSIS — Z8249 Family history of ischemic heart disease and other diseases of the circulatory system: Secondary | ICD-10-CM | POA: Insufficient documentation

## 2017-11-09 DIAGNOSIS — Z87892 Personal history of anaphylaxis: Secondary | ICD-10-CM | POA: Insufficient documentation

## 2017-11-09 DIAGNOSIS — J209 Acute bronchitis, unspecified: Secondary | ICD-10-CM | POA: Diagnosis not present

## 2017-11-09 DIAGNOSIS — I712 Thoracic aortic aneurysm, without rupture: Secondary | ICD-10-CM | POA: Diagnosis not present

## 2017-11-09 DIAGNOSIS — M8580 Other specified disorders of bone density and structure, unspecified site: Secondary | ICD-10-CM | POA: Insufficient documentation

## 2017-11-09 DIAGNOSIS — R05 Cough: Secondary | ICD-10-CM | POA: Insufficient documentation

## 2017-11-09 DIAGNOSIS — H3530 Unspecified macular degeneration: Secondary | ICD-10-CM | POA: Diagnosis not present

## 2017-11-09 DIAGNOSIS — C8190 Hodgkin lymphoma, unspecified, unspecified site: Secondary | ICD-10-CM | POA: Diagnosis present

## 2017-11-09 DIAGNOSIS — R55 Syncope and collapse: Principal | ICD-10-CM | POA: Diagnosis present

## 2017-11-09 DIAGNOSIS — Z888 Allergy status to other drugs, medicaments and biological substances status: Secondary | ICD-10-CM | POA: Diagnosis not present

## 2017-11-09 DIAGNOSIS — E669 Obesity, unspecified: Secondary | ICD-10-CM | POA: Diagnosis not present

## 2017-11-09 DIAGNOSIS — Z79899 Other long term (current) drug therapy: Secondary | ICD-10-CM | POA: Diagnosis not present

## 2017-11-09 DIAGNOSIS — H8110 Benign paroxysmal vertigo, unspecified ear: Secondary | ICD-10-CM | POA: Insufficient documentation

## 2017-11-09 DIAGNOSIS — Z881 Allergy status to other antibiotic agents status: Secondary | ICD-10-CM | POA: Insufficient documentation

## 2017-11-09 DIAGNOSIS — Z800 Family history of malignant neoplasm of digestive organs: Secondary | ICD-10-CM | POA: Insufficient documentation

## 2017-11-09 DIAGNOSIS — Z87891 Personal history of nicotine dependence: Secondary | ICD-10-CM | POA: Diagnosis not present

## 2017-11-09 HISTORY — DX: Syncope and collapse: R55

## 2017-11-09 HISTORY — DX: Malignant (primary) neoplasm, unspecified: C80.1

## 2017-11-09 LAB — CBC WITH DIFFERENTIAL/PLATELET
BASOS ABS: 0 10*3/uL (ref 0.0–0.1)
Basophils Relative: 0 %
Eosinophils Absolute: 0.1 10*3/uL (ref 0.0–0.7)
Eosinophils Relative: 1 %
HEMATOCRIT: 34.5 % — AB (ref 36.0–46.0)
Hemoglobin: 11.4 g/dL — ABNORMAL LOW (ref 12.0–15.0)
LYMPHS PCT: 10 %
Lymphs Abs: 1.7 10*3/uL (ref 0.7–4.0)
MCH: 28.1 pg (ref 26.0–34.0)
MCHC: 33 g/dL (ref 30.0–36.0)
MCV: 85.2 fL (ref 78.0–100.0)
MONO ABS: 0.1 10*3/uL (ref 0.1–1.0)
MONOS PCT: 0 %
NEUTROS ABS: 15.4 10*3/uL — AB (ref 1.7–7.7)
Neutrophils Relative %: 89 %
Platelets: 261 10*3/uL (ref 150–400)
RBC: 4.05 MIL/uL (ref 3.87–5.11)
RDW: 15 % (ref 11.5–15.5)
WBC: 17.3 10*3/uL — ABNORMAL HIGH (ref 4.0–10.5)

## 2017-11-09 LAB — I-STAT CG4 LACTIC ACID, ED
Lactic Acid, Venous: 2.23 mmol/L (ref 0.5–1.9)
Lactic Acid, Venous: 1.33 mmol/L (ref 0.5–1.9)

## 2017-11-09 LAB — COMPREHENSIVE METABOLIC PANEL
ALBUMIN: 3.4 g/dL — AB (ref 3.5–5.0)
ALT: 37 U/L (ref 14–54)
AST: 30 U/L (ref 15–41)
Alkaline Phosphatase: 87 U/L (ref 38–126)
Anion gap: 12 (ref 5–15)
BUN: 19 mg/dL (ref 6–20)
CO2: 20 mmol/L — AB (ref 22–32)
CREATININE: 0.78 mg/dL (ref 0.44–1.00)
Calcium: 9.2 mg/dL (ref 8.9–10.3)
Chloride: 105 mmol/L (ref 101–111)
GFR calc Af Amer: >60 mL/min (ref >60)
GFR calc non Af Amer: >60 mL/min (ref >60)
Glucose, Bld: 103 mg/dL — ABNORMAL HIGH (ref 65–99)
POTASSIUM: 3.6 mmol/L (ref 3.5–5.1)
SODIUM: 137 mmol/L (ref 135–145)
Total Bilirubin: 0.7 mg/dL (ref 0.3–1.2)
Total Protein: 6.7 g/dL (ref 6.5–8.1)

## 2017-11-09 LAB — BRAIN NATRIURETIC PEPTIDE: B Natriuretic Peptide: 201.4 pg/mL — ABNORMAL HIGH (ref 0.0–100.0)

## 2017-11-09 LAB — I-STAT TROPONIN, ED: TROPONIN I, POC: 0.02 ng/mL (ref 0.00–0.08)

## 2017-11-09 LAB — D-DIMER, QUANTITATIVE: D-Dimer, Quant: 1.69 ug/mL-FEU — ABNORMAL HIGH (ref 0.00–0.50)

## 2017-11-09 LAB — TROPONIN I: Troponin I: <0.03 ng/mL (ref <0.03)

## 2017-11-09 MED ORDER — POLYETHYLENE GLYCOL 3350 17 G PO PACK: 17.0000 g | PACK | Freq: Every day | ORAL | Status: DC

## 2017-11-09 MED ORDER — PROCHLORPERAZINE MALEATE 10 MG PO TABS: 10.0000 mg | ORAL_TABLET | Freq: Four times a day (QID) | ORAL | Status: DC | PRN

## 2017-11-09 MED ORDER — ACETAMINOPHEN 650 MG RE SUPP: 650.0000 mg | Freq: Four times a day (QID) | RECTAL | Status: DC | PRN

## 2017-11-09 MED ORDER — TRIAMCINOLONE ACETONIDE 55 MCG/ACT NA AERO: 2.0000 | INHALATION_SPRAY | Freq: Every day | NASAL | Status: DC | PRN

## 2017-11-09 MED ORDER — PROMETHAZINE HCL 25 MG RE SUPP: 25.0000 mg | Freq: Four times a day (QID) | RECTAL | Status: DC | PRN

## 2017-11-09 MED ORDER — SODIUM CHLORIDE 0.9 % IV SOLN
INTRAVENOUS | Status: DC
  Administered 2017-11-09: 18:00:00 via INTRAVENOUS

## 2017-11-09 MED ORDER — SODIUM CHLORIDE 0.9 % IV BOLUS
1000.0000 mL | Freq: Once | INTRAVENOUS | Status: AC
  Administered 2017-11-09: 1000 mL via INTRAVENOUS

## 2017-11-09 MED ORDER — IOPAMIDOL (ISOVUE-300) INJECTION 61%
INTRAVENOUS | Status: AC
  Administered 2017-11-09: 15:00:00
  Filled 2017-11-09: qty 100

## 2017-11-09 MED ORDER — IOPAMIDOL (ISOVUE-370) INJECTION 76%
INTRAVENOUS | Status: AC
  Administered 2017-11-09: 76 mL
  Filled 2017-11-09: qty 100

## 2017-11-09 MED ORDER — IPRATROPIUM-ALBUTEROL 0.5-2.5 (3) MG/3ML IN SOLN
3.0000 mL | RESPIRATORY_TRACT | Status: DC | PRN
Start: 2017-11-09 — End: 2017-11-10

## 2017-11-09 MED ORDER — ACETAMINOPHEN 325 MG PO TABS: 650.0000 mg | ORAL_TABLET | Freq: Four times a day (QID) | ORAL | Status: DC | PRN

## 2017-11-09 MED ORDER — ENOXAPARIN SODIUM 40 MG/0.4ML ~~LOC~~ SOLN
40.0000 mg | SUBCUTANEOUS | Status: DC
  Administered 2017-11-09: 40 mg via SUBCUTANEOUS
  Filled 2017-11-09: qty 0.4

## 2017-11-09 MED ORDER — LORAZEPAM 0.5 MG PO TABS: 0.5000 mg | ORAL_TABLET | Freq: Three times a day (TID) | ORAL | Status: DC | PRN

## 2017-11-09 MED ORDER — ONDANSETRON HCL 4 MG PO TABS: 8.0000 mg | ORAL_TABLET | Freq: Two times a day (BID) | ORAL | Status: DC | PRN

## 2017-11-09 MED ORDER — ENSURE ENLIVE PO LIQD: 237.0000 mL | Freq: Two times a day (BID) | ORAL | Status: DC

## 2017-11-09 NOTE — ED Triage Notes (Signed)
Patient here from home with complaints of congestion and dry cough x4 days. Cancer patient.

## 2017-11-09 NOTE — ED Notes (Signed)
Pt and family updated on plan of care  

## 2017-11-09 NOTE — H&P (Signed)
History and Physical    Jennifer Juarez FBP:[id number] DOB: 12-Mar-1946 DOA: 11/09/2017  PCP: Kathyrn Lass, MD   Patient coming from: home   Chief Complaint: cough, presyncope  HPI: Jennifer Juarez is a 72 y.o. female with medical history significant of Hodgkin's lymphoma status post cycle 2 of doxorubicin, vincristine, dexamethasone on 11/04/2017 who comes in with dry cough for 3 days and some wheezing and episodes of presyncope.  Patient reports she was generally doing fairly well until she completed her course of chemotherapy.  She reports that after receiving the G-CSF injections she began to have a dry cough.  She reported the cough would occur throughout the day and was nonproductive.  She also reported some associated wheezing.  She reported the cough is not worse at night.  She also noted some associated presyncope with a feeling of lightheadedness whenever she walked.  She reported sitting out immediately improve the symptoms.  She denied any chest pain, shortness of breath, nausea, vomiting, diarrhea.  She denied any syncope or presyncope.  She has not had any rash.  She denies any fevers, congestion, rhinorrhea.  She does report a history of BPPV however this presyncope feeling is quite different as she does not feel any vertigo but rather that she is going to pass out.  She reports that there does not appear to be a positional component to it but it does occur when she walks a great deal.  ED Course: In the ED vital signs were notable for mild tachycardia that was intermittent.  Labs are notable for a white blood cell count of 17.3.  CMP was normal.BNP was in the 200s.  D-dimer was elevated.  Chest x-ray was normal.  CTA PE study showed no acute process other than a 4.4 descending thoracic aortic aneurysm.  Patient was admitted for presyncope evaluation.  Review of Systems: As per HPI otherwise 10 point review of systems negative.    Past Medical History:  Diagnosis Date  . BPPV (benign  paroxysmal positional vertigo)   . Cancer ([place])   . H/O seasonal allergies   . Hearing loss in right ear   . Left cervical lymphadenopathy   . Macular degeneration   . Obesity   . Osteopenia   . Pre-syncope 11/09/2017  . Retinal vein occlusion   . Vitamin D deficiency     Past Surgical History:  Procedure Laterality Date  . CHOLECYSTECTOMY    . IR FLUORO GUIDE PORT INSERTION RIGHT  10/10/2017  . IR US GUIDE VASC ACCESS RIGHT  10/10/2017  . MASS EXCISION Left 09/22/2017   Procedure: LEFT CERVICAL LYMPH NODE EXCISION;  Surgeon: Izora Gala, MD;  Location: [facility];  Service: ENT;  Laterality: Left;  . TUBAL LIGATION       reports that she quit smoking about 29 years ago. She has never used smokeless tobacco. She reports that she does not drink alcohol or use drugs.  Allergies  Allergen Reactions  . Cephalexin Anaphylaxis    Tongue and throat swelling  . Doxycycline Diarrhea and Nausea And Vomiting  . Levofloxacin Other (See Comments)    Tachycardia  . Prilosec [Omeprazole] Other (See Comments)    Makes pt dizzy   ** Any Family Meds  . Singulair [Montelukast]     Rapid Heart rate    Family History  Problem Relation Age of Onset  . Cancer Mother        liver  . Heart attack Father   .  Hypertension Father   . Diabetes Mellitus I Father   . Cancer Sister        pancreatic, breast  . Diabetes Mellitus I Sister   . Heart disease Brother   . Heart disease Brother   . Heart disease Brother   . Diabetes Mellitus I Sister   . Diabetes Mellitus I Sister     Prior to Admission medications   Medication Sig Start Date End Date Taking? Authorizing Provider  acetaminophen (TYLENOL) 500 MG tablet Take 500 mg by mouth every 8 (eight) hours as needed for mild pain or moderate pain.    Yes [provider]  dexamethasone (DECADRON) 4 MG tablet Take 2 tablets by mouth once a day on the day after chemotherapy and then take 2 tablets two times a day for 2 days.  Take with food. 10/05/17  Yes Brunetta Genera, MD  lidocaine-prilocaine (EMLA) cream Apply to affected area once Patient taking differently: Apply 1 application topically daily as needed. Port 10/05/17  Yes Brunetta Genera, MD  LORazepam (ATIVAN) 0.5 MG tablet Take 1 tablet (0.5 mg total) by mouth every 8 (eight) hours as needed for anxiety (nausea). 09/16/17  Yes Brunetta Genera, MD  polyethylene glycol [facility] / Floria Raveling) packet Take 17 g by mouth daily.   Yes [provider]  prochlorperazine (COMPAZINE) 10 MG tablet Take 1 tablet (10 mg total) by mouth every 6 (six) hours as needed (Nausea or vomiting). 10/05/17  Yes Brunetta Genera, MD  triamcinolone (NASACORT AQ) 55 MCG/ACT AERO nasal inhaler Place 2 sprays into the nose daily as needed (allergies).    Yes [provider]  ondansetron (ZOFRAN) 8 MG tablet Take 1 tablet (8 mg total) by mouth 2 (two) times daily as needed. Start on the third day after chemotherapy. 10/05/17   Brunetta Genera, MD  promethazine (PHENERGAN) 25 MG suppository Place 1 suppository (25 mg total) rectally every 6 (six) hours as needed for nausea or vomiting. 09/22/17   Izora Gala, MD    Physical Exam: Vitals:   11/09/17 1245 11/09/17 1338 11/09/17 1400 11/09/17 1500  BP:   121/76 133/72  Pulse: 78 79 82 84  Resp: (!) 21 17 20 19   Temp:      TempSrc:      SpO2: 99% 100% 99% 99%    Constitutional: NAD, calm, comfortable Vitals:   11/09/17 1245 11/09/17 1338 11/09/17 1400 11/09/17 1500  BP:   121/76 133/72  Pulse: 78 79 82 84  Resp: (!) 21 17 20 19   Temp:      TempSrc:      SpO2: 99% 100% 99% 99%   Eyes: Anicteric sclera ENMT: Dry mucous membranes, good dentition.  Neck: normal, supple Respiratory: clear to auscultation bilaterally, no wheezing, no crackles. Normal respiratory effort. No accessory muscle use.  Cardiovascular: Regular rate and rhythm, no murmurs Abdomen: no tenderness, no masses palpated. No  hepatosplenomegaly. Bowel sounds positive.  Musculoskeletal: Trace lower extremity edema.  Skin: Tunneled port site is clean dry intact Neurologic: Grossly intact, moving all extremities Psychiatric: Normal judgment and insight. Alert and oriented x 3. Normal mood.   Labs on Admission: I have personally reviewed following labs and imaging studies  CBC: Recent Labs  Lab 11/04/17 0735 11/09/17 0916  WBC 11.8* 17.3*  NEUTROABS 8.3* 15.4*  HGB 11.1* 11.4*  HCT 34.8 34.5*  MCV 86.1 85.2  PLT 298 660   Basic Metabolic Panel: Recent Labs  Lab 11/04/17 0735 11/09/17 0916  NA 139 137  K 3.8 3.6  CL 108 105  CO2 24 20*  GLUCOSE 99 103*  BUN 9 19  CREATININE 0.75 0.78  CALCIUM 9.6 9.2   GFR: Estimated Creatinine Clearance: 74.5 mL/min (by C-G formula based on SCr of 0.78 mg/dL). Liver Function Tests: Recent Labs  Lab 11/04/17 0735 11/09/17 0916  AST 17 30  ALT 13 37  ALKPHOS 102 87  BILITOT 0.3 0.7  PROT 6.8 6.7  ALBUMIN 3.3* 3.4*   No results for input(s): LIPASE, AMYLASE in the last 168 hours. No results for input(s): AMMONIA in the last 168 hours. Coagulation Profile: No results for input(s): INR, PROTIME in the last 168 hours. Cardiac Enzymes: Recent Labs  Lab 11/09/17 0916  TROPONINI <0.03   BNP (last 3 results) No results for input(s): PROBNP in the last 8760 hours. HbA1C: No results for input(s): HGBA1C in the last 72 hours. CBG: No results for input(s): GLUCAP in the last 168 hours. Lipid Profile: No results for input(s): CHOL, HDL, LDLCALC, TRIG, CHOLHDL, LDLDIRECT in the last 72 hours. Thyroid Function Tests: No results for input(s): TSH, T4TOTAL, FREET4, T3FREE, THYROIDAB in the last 72 hours. Anemia Panel: No results for input(s): VITAMINB12, FOLATE, FERRITIN, TIBC, IRON, RETICCTPCT in the last 72 hours. Urine analysis: No results found for: COLORURINE, APPEARANCEUR, LABSPEC, PHURINE, GLUCOSEU, HGBUR, BILIRUBINUR, KETONESUR, PROTEINUR,  UROBILINOGEN, NITRITE, LEUKOCYTESUR  Radiological Exams on Admission: Dg Chest 2 View  Result Date: 11/09/2017 CLINICAL DATA:  Dry cough for the past 4 days. EXAM: CHEST - 2 VIEW COMPARISON:  Chest x-ray dated March 02, 2015. FINDINGS: Right chest wall port catheter with the tip in the proximal SVC. The heart size and mediastinal contours are within normal limits. Tortuous thoracic aorta. Normal pulmonary vascularity. No focal consolidation, pleural effusion, or pneumothorax. No acute osseous abnormality. IMPRESSION: No active cardiopulmonary disease. Electronically Signed   By: Titus Dubin M.D.   On: 11/09/2017 10:10   Ct Angio Chest Pe W And/or Wo Contrast  Result Date: 11/09/2017 CLINICAL DATA:  Cough, congestion x4 days.  Hodgkin's lymphoma. EXAM: CT ANGIOGRAPHY CHEST WITH CONTRAST TECHNIQUE: Multidetector CT imaging of the chest was performed using the standard protocol during bolus administration of intravenous contrast. Multiplanar CT image reconstructions and MIPs were obtained to evaluate the vascular anatomy. CONTRAST:  45mL ISOVUE-370 IOPAMIDOL (ISOVUE-370) INJECTION 76% COMPARISON:  PET-CT 09/17/2017 and previous FINDINGS: Cardiovascular: Heart size normal. Trace pericardial fluid. Right IJ port catheter extends to the lower SVC. Right ventricle nondilated. Satisfactory opacification of pulmonary arteries noted, and there is no evidence of pulmonary emboli. Adequate contrast opacification of the thoracic aorta. No evidence of dissection or stenosis. The ascending segment and arch are normal in caliber. There is classic 3-vessel brachiocephalic arch anatomy without proximal stenosis. Scattered calcified plaque in the arch and descending thoracic segment. There is fusiform dilatation of the descending thoracic aorta measuring 3.6 cm proximally, 4.4 cm maximum transverse diameter in its mid segment, 4.3 cm diameter just above and at the diaphragm. There is some eccentric nonocclusive mural  thrombus in the distal descending segment. The proximal abdominal aorta measures 3.5 cm diameter just below the origin of the celiac axis, incompletely visualized distally. Mediastinum/Nodes: No hilar or mediastinal adenopathy. Lungs/Pleura: 0.7 cm angular probable intrapulmonary lymph node, right middle lobe image 72/10, stable since 01/25/2014. Lungs are otherwise clear. No pleural effusion or pneumothorax. Upper Abdomen: Cholecystectomy clips. Mild pancreatic parenchymal atrophy. Stable 18.5 mm retroperitoneal nodule posterior to the liver with central coarse calcification.  1.2 cm left adrenal nodule stable since 01/25/2014 suggesting benign adrenal adenoma. No acute findings. Musculoskeletal: No chest wall abnormality. No acute or significant osseous findings. Review of the MIP images confirms the above findings. IMPRESSION: 1. Negative for acute PE or thoracic aortic dissection. 2. Stable 4.4 cm descending thoracic aortic aneurysm. Recommend semi-annual imaging followup by CTA or MRA and referral to cardiothoracic surgery if not already obtained. This recommendation follows 2010 ACCF/AHA/AATS/ACR/ASA/SCA/SCAI/SIR/STS/SVM Guidelines for the Diagnosis and Management of Patients With Thoracic Aortic Disease. Circulation. 2010; 121: [id number] 3. Stable 1.8 cm right upper quadrant retroperitoneal nodule. Electronically Signed   By: Lucrezia Europe M.D.   On: 11/09/2017 14:56    EKG: Independently reviewed.  Sinus rhythm, no acute ST segment changes, no prior for comparison  Assessment/Plan Principal Problem:   Pre-syncope Active Problems:   Hodgkin's lymphoma ([place])   Port-A-Cath in place   #) Presyncope: At this time it is unclear what mediates or syncope.  Suspect it could be cough variant syncope versus dehydration due to the chemo.  She does not have any sign of infection and she does not appear to be particularly volume depleted.  She does not have any chest pain or cardiac symptomatology.  She literally  had an echo 1 month ago that was completely normal. -Orthostatic vitals -Telemetry - IV fluids  #) Cough: Dry cough with no CT or chest x-ray imaging of pneumonia or atypical infection.  With wheezing could suggest a reactive airway disease component. -PRN bronchodilators  #) Mixed cellularity stage I Hodgkin's lymphoma: -Dr. Irene Limbo notified in epic  Fluids: Gentle IV fluids Electrolytes: Monitor and supplement Nutrition: Regular diet  Prophylaxis: Enoxaparin  Disposition: Pending monitoring on telemetry and IV fluids and evaluation of orthostatics  Full code   Cristy Folks MD [facility]   If 7PM-7AM, please contact night-coverage [email or web address] Password [facility]  11/09/2017, 3:51 PM

## 2017-11-09 NOTE — ED Notes (Signed)
Pt removed from monitoring equipment to go to restroom. Pt ambulated to restroom with cane without difficulty

## 2017-11-09 NOTE — ED Provider Notes (Signed)
[facility] DEPT Provider Note   CSN: [id number] Arrival date & time: 11/09/17  4098     History   Chief Complaint Chief Complaint  Patient presents with  . Cough  . Nasal Congestion    HPI Jennifer Juarez is a 72 y.o. female.  HPI Patient with ongoing therapy for Hodgkin's lymphoma presents with cough, congestion, lightheadedness. Last chemotherapy was 4 days ago, last Neulasta 3 days ago, and in the day afterwards she felt generally well, but subsequently developed after mentioned concerns. Since onset, she has had no relief in spite of using Tylenol, salt water gargle. Today after speaking with her oncology team she was sent here for evaluation. She denies fever, syncope, focal pain. She is here with a companion who assists with corroborates the HPI. Past Medical History:  Diagnosis Date  . BPPV (benign paroxysmal positional vertigo)   . H/O seasonal allergies   . Hearing loss in right ear   . Left cervical lymphadenopathy   . Macular degeneration   . Obesity   . Osteopenia   . Retinal vein occlusion   . Vitamin D deficiency     Patient Active Problem List   Diagnosis Date Noted  . Port-A-Cath in place 10/27/2017  . 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 Counseling regarding advance care planning and goals of care 10/05/2017  . Hodgkin's lymphoma ([place]) 09/26/2017    Past Surgical History:  Procedure Laterality Date  . CHOLECYSTECTOMY    . IR FLUORO GUIDE PORT INSERTION RIGHT  10/10/2017  . IR US GUIDE VASC ACCESS RIGHT  10/10/2017  . MASS EXCISION Left 09/22/2017   Procedure: LEFT CERVICAL LYMPH NODE EXCISION;  Surgeon: Izora Gala, MD;  Location: [facility];  Service: ENT;  Laterality: Left;  . TUBAL LIGATION       OB History   None      Home Medications    Prior to Admission medications   Medication Sig Start Date End Date Taking? Authorizing Provider  acetaminophen  (TYLENOL) 500 MG tablet Take 500 mg by mouth every 8 (eight) hours as needed for mild pain or moderate pain.    Yes [provider]  dexamethasone (DECADRON) 4 MG tablet Take 2 tablets by mouth once a day on the day after chemotherapy and then take 2 tablets two times a day for 2 days. Take with food. 10/05/17  Yes Brunetta Genera, MD  lidocaine-prilocaine (EMLA) cream Apply to affected area once Patient taking differently: Apply 1 application topically daily as needed. Port 10/05/17  Yes Brunetta Genera, MD  LORazepam (ATIVAN) 0.5 MG tablet Take 1 tablet (0.5 mg total) by mouth every 8 (eight) hours as needed for anxiety (nausea). 09/16/17  Yes Brunetta Genera, MD  polyethylene glycol [facility] / Floria Raveling) packet Take 17 g by mouth daily.   Yes [provider]  prochlorperazine (COMPAZINE) 10 MG tablet Take 1 tablet (10 mg total) by mouth every 6 (six) hours as needed (Nausea or vomiting). 10/05/17  Yes Brunetta Genera, MD  triamcinolone (NASACORT AQ) 55 MCG/ACT AERO nasal inhaler Place 2 sprays into the nose daily as needed (allergies).    Yes [provider]  ondansetron (ZOFRAN) 8 MG tablet Take 1 tablet (8 mg total) by mouth 2 (two) times daily as needed. Start on the third day after chemotherapy. 10/05/17   Brunetta Genera, MD  promethazine (PHENERGAN) 25 MG suppository Place 1 suppository (25  mg total) rectally every 6 (six) hours as needed for nausea or vomiting. 09/22/17   Izora Gala, MD    Family History Family History  Problem Relation Age of Onset  . Cancer Mother        liver  . Heart attack Father   . Hypertension Father   . Diabetes Mellitus I Father   . Cancer Sister        pancreatic, breast  . Diabetes Mellitus I Sister   . Heart disease Brother   . Heart disease Brother   . Heart disease Brother   . Diabetes Mellitus I Sister   . Diabetes Mellitus I Sister     Social History Social History   Tobacco Use  . Smoking  status: Former Smoker    Last attempt to quit: 06/24/1988    Years since quitting: 29.3  . Smokeless tobacco: Never Used  Substance Use Topics  . Alcohol use: No  . Drug use: No     Allergies   Cephalexin; Doxycycline; Levofloxacin; Prilosec [omeprazole]; and Singulair [montelukast]   Review of Systems Review of Systems  Constitutional:       Per HPI, otherwise negative  HENT:       Per HPI, otherwise negative  Respiratory:       Per HPI, otherwise negative  Cardiovascular:       Per HPI, otherwise negative  Gastrointestinal: Negative for vomiting.  Endocrine:       Negative aside from HPI  Genitourinary:       Neg aside from HPI   Musculoskeletal:       Per HPI, otherwise negative  Skin: Negative.   Allergic/Immunologic: Positive for immunocompromised state.  Neurological: Positive for weakness and light-headedness. Negative for syncope.     Physical Exam Updated Vital Signs BP 133/72   Pulse 84   Temp 97.9 F (36.6 C) (Oral)   Resp 19   SpO2 99%   Physical Exam  Constitutional: She is oriented to person, place, and time. She appears well-developed and well-nourished. No distress.  HENT:  Head: Normocephalic and atraumatic.  Mouth/Throat: Uvula is midline, oropharynx is clear and moist and mucous membranes are normal.  Eyes: Conjunctivae and EOM are normal.  Cardiovascular: Regular rhythm. Tachycardia present.  Pulmonary/Chest: Effort normal and breath sounds normal. No stridor. No respiratory distress.  Abdominal: She exhibits no distension.  Musculoskeletal: She exhibits no edema.  Neurological: She is alert and oriented to person, place, and time. No cranial nerve deficit.  Skin: Skin is warm and dry.  Psychiatric: She has a normal mood and affect.  Nursing note and vitals reviewed.    ED Treatments / Results  Labs (all labs ordered are listed, but only abnormal results are displayed) Labs Reviewed  COMPREHENSIVE METABOLIC PANEL - Abnormal; Notable  for the following components:      Result Value   CO2 20 (*)    Glucose, Bld 103 (*)    Albumin 3.4 (*)    All other components within normal limits  CBC WITH DIFFERENTIAL/PLATELET - Abnormal; Notable for the following components:   WBC 17.3 (*)    Hemoglobin 11.4 (*)    HCT 34.5 (*)    Neutro Abs 15.4 (*)    All other components within normal limits  BRAIN NATRIURETIC PEPTIDE - Abnormal; Notable for the following components:   B Natriuretic Peptide 201.4 (*)    All other components within normal limits  D-DIMER, QUANTITATIVE (NOT AT [facility]) - Abnormal; Notable for the  following components:   D-Dimer, Quant 1.69 (*)    All other components within normal limits  I-STAT CG4 LACTIC ACID, ED - Abnormal; Notable for the following components:   Lactic Acid, Venous 2.23 (*)    All other components within normal limits  TROPONIN I  I-STAT CG4 LACTIC ACID, ED  I-STAT TROPONIN, ED    EKG EKG Interpretation  Date/Time:  Sunday Nov 09 2017 12:28:04 EDT Ventricular Rate:  74 PR Interval:    QRS Duration: 85 QT Interval:  413 QTC Calculation: 459 R Axis:   1 Text Interpretation:  Sinus rhythm Low voltage, precordial leads Abnormal R-wave progression, early transition Abnormal ekg Confirmed by Carmin Muskrat [phone]) on 11/09/2017 12:46:27 PM   Radiology Dg Chest 2 View  Result Date: 11/09/2017 CLINICAL DATA:  Dry cough for the past 4 days. EXAM: CHEST - 2 VIEW COMPARISON:  Chest x-ray dated March 02, 2015. FINDINGS: Right chest wall port catheter with the tip in the proximal SVC. The heart size and mediastinal contours are within normal limits. Tortuous thoracic aorta. Normal pulmonary vascularity. No focal consolidation, pleural effusion, or pneumothorax. No acute osseous abnormality. IMPRESSION: No active cardiopulmonary disease. Electronically Signed   By: Titus Dubin M.D.   On: 11/09/2017 10:10   Ct Angio Chest Pe W And/or Wo Contrast  Result Date: 11/09/2017 CLINICAL DATA:   Cough, congestion x4 days.  Hodgkin's lymphoma. EXAM: CT ANGIOGRAPHY CHEST WITH CONTRAST TECHNIQUE: Multidetector CT imaging of the chest was performed using the standard protocol during bolus administration of intravenous contrast. Multiplanar CT image reconstructions and MIPs were obtained to evaluate the vascular anatomy. CONTRAST:  44mL ISOVUE-370 IOPAMIDOL (ISOVUE-370) INJECTION 76% COMPARISON:  PET-CT 09/17/2017 and previous FINDINGS: Cardiovascular: Heart size normal. Trace pericardial fluid. Right IJ port catheter extends to the lower SVC. Right ventricle nondilated. Satisfactory opacification of pulmonary arteries noted, and there is no evidence of pulmonary emboli. Adequate contrast opacification of the thoracic aorta. No evidence of dissection or stenosis. The ascending segment and arch are normal in caliber. There is classic 3-vessel brachiocephalic arch anatomy without proximal stenosis. Scattered calcified plaque in the arch and descending thoracic segment. There is fusiform dilatation of the descending thoracic aorta measuring 3.6 cm proximally, 4.4 cm maximum transverse diameter in its mid segment, 4.3 cm diameter just above and at the diaphragm. There is some eccentric nonocclusive mural thrombus in the distal descending segment. The proximal abdominal aorta measures 3.5 cm diameter just below the origin of the celiac axis, incompletely visualized distally. Mediastinum/Nodes: No hilar or mediastinal adenopathy. Lungs/Pleura: 0.7 cm angular probable intrapulmonary lymph node, right middle lobe image 72/10, stable since 01/25/2014. Lungs are otherwise clear. No pleural effusion or pneumothorax. Upper Abdomen: Cholecystectomy clips. Mild pancreatic parenchymal atrophy. Stable 18.5 mm retroperitoneal nodule posterior to the liver with central coarse calcification. 1.2 cm left adrenal nodule stable since 01/25/2014 suggesting benign adrenal adenoma. No acute findings. Musculoskeletal: No chest wall  abnormality. No acute or significant osseous findings. Review of the MIP images confirms the above findings. IMPRESSION: 1. Negative for acute PE or thoracic aortic dissection. 2. Stable 4.4 cm descending thoracic aortic aneurysm. Recommend semi-annual imaging followup by CTA or MRA and referral to cardiothoracic surgery if not already obtained. This recommendation follows 2010 ACCF/AHA/AATS/ACR/ASA/SCA/SCAI/SIR/STS/SVM Guidelines for the Diagnosis and Management of Patients With Thoracic Aortic Disease. Circulation. 2010; 121: e266-e36 3. Stable 1.8 cm right upper quadrant retroperitoneal nodule. Electronically Signed   By: Lucrezia Europe M.D.   On: 11/09/2017 14:56  Procedures Procedures (including critical care time)  Medications Ordered in ED Medications  sodium chloride 0.9 % bolus 1,000 mL (0 mLs Intravenous Stopped 11/09/17 1059)  iopamidol (ISOVUE-300) 61 % injection (  Contrast Given 11/09/17 1520)  iopamidol (ISOVUE-370) 76 % injection (76 mLs  Contrast Given 11/09/17 1423)     Initial Impression / Assessment and Plan / ED Course  I have reviewed the triage vital signs and the nursing notes.  Pertinent labs & imaging results that were available during my care of the patient were reviewed by me and considered in my medical decision making (see chart for details).    Chart review notable for ongoing therapy for Hodgkin's lymphoma. Patient also had echocardiogram October 05, 2017 LV EF: 60% -   65%   ------------------------------------------------------------------- Indications:      V58.11 Chemotherapy Evaluation.   ------------------------------------------------------------------- History:   PMH:  Hodgkin&'s Lymphoma.   ------------------------------------------------------------------- Study Conclusions   - Left ventricle: Global LV longitudinal strain is normal at -21.3%   The cavity size was normal. There was mild focal basal   hypertrophy of the septum. Systolic function  was normal. The   estimated ejection fraction was in the range of 60% to 65%. Wall   motion was normal; there were no regional wall motion   abnormalities. Left ventricular diastolic function parameters   were normal. - Aortic valve: Mildly calcified annulus. Trileaflet; normal   thickness, mildly calcified leaflets. - Atrial septum: There was increased thickness of the septum,   consistent with lipomatous hypertrophy. - Tricuspid valve: There was trivial regurgitation.  3:35 PM On repeat exam the patient is in no distress, has mild persistent tachycardia, continues to complain of some lightheadedness, though her symptoms have improved markedly. Findings discussed, reviewed with patient and nephew. Given the patient's persistent near syncope, she will be admitted for further evaluation and management. No early evidence for PE, ACS, obvious infection. Symptoms may be secondary to medication versus progression of disease.   Final Clinical Impressions(s) / ED Diagnoses  Near-syncope   Carmin Muskrat, MD 11/09/17 1536

## 2017-11-09 NOTE — ED Notes (Signed)
ED TO INPATIENT HANDOFF REPORT  Name/Age/Gender Jennifer Juarez 72 y.o. female  Code Status   Home/SNF/Other Home  Chief Complaint chemo pt/uri symptoms  Level of Care/Admitting Diagnosis ED Disposition    ED Disposition Condition [facility] Area: [place] [[id number]]  Level of Care: Telemetry [5]  Admit to tele based on following criteria: Eval of Syncope  Diagnosis: Pre-syncope [016553]  Admitting Physician: Cristy Folks [[id number]]  Attending Physician: Cristy Folks [[id number]]  PT Class (Do Not Modify): Observation [104]  PT Acc Code (Do Not Modify): Observation [10022]       Medical History Past Medical History:  Diagnosis Date  . BPPV (benign paroxysmal positional vertigo)   . Cancer ([place])   . H/O seasonal allergies   . Hearing loss in right ear   . Left cervical lymphadenopathy   . Macular degeneration   . Obesity   . Osteopenia   . Pre-syncope 11/09/2017  . Retinal vein occlusion   . Vitamin D deficiency     Allergies Allergies  Allergen Reactions  . Cephalexin Anaphylaxis    Tongue and throat swelling  . Doxycycline Diarrhea and Nausea And Vomiting  . Levofloxacin Other (See Comments)    Tachycardia  . Prilosec [Omeprazole] Other (See Comments)    Makes pt dizzy   ** Any Family Meds  . Singulair [Montelukast]     Rapid Heart rate    IV Location/Drains/Wounds Patient Lines/Drains/Airways Status   Active Line/Drains/Airways    Name:   Placement date:   Placement time:   Site:   Days:   Implanted Port 10/10/17 Right Chest   10/10/17    -    Chest   30   Peripheral IV 11/09/17 Right Antecubital   11/09/17    0916    Antecubital   less than 1   Open Drain 1 Left Neck   09/22/17    1003    Neck   48   Incision (Closed) 09/22/17 Neck Left   09/22/17    0949     48          Labs/Imaging Results for orders placed or performed during the hospital encounter of 11/09/17 (from the past 48 hour(s))   Comprehensive metabolic panel     Status: Abnormal   Collection Time: 11/09/17  9:16 AM  Result Value Ref Range   Sodium 137 135 - 145 mmol/L   Potassium 3.6 3.5 - 5.1 mmol/L   Chloride 105 101 - 111 mmol/L   CO2 20 (L) 22 - 32 mmol/L   Glucose, Bld 103 (H) 65 - 99 mg/dL   BUN 19 6 - 20 mg/dL   Creatinine, Ser 0.78 0.44 - 1.00 mg/dL   Calcium 9.2 8.9 - 10.3 mg/dL   Total Protein 6.7 6.5 - 8.1 g/dL   Albumin 3.4 (L) 3.5 - 5.0 g/dL   AST 30 15 - 41 U/L   ALT 37 14 - 54 U/L   Alkaline Phosphatase 87 38 - 126 U/L   Total Bilirubin 0.7 0.3 - 1.2 mg/dL   GFR calc non Af Amer >60 >60 mL/min   GFR calc Af Amer >60 >60 mL/min    Comment: (NOTE) The eGFR has been calculated using the CKD EPI equation. This calculation has not been validated in all clinical situations. eGFR's persistently <60 mL/min signify possible Chronic Kidney Disease.    Anion gap 12 5 - 15    Comment: Performed at [facility] [address], [place], [place] [address]  CBC with Differential     Status: Abnormal   Collection Time: 11/09/17  9:16 AM  Result Value Ref Range   WBC 17.3 (H) 4.0 - 10.5 K/uL   RBC 4.05 3.87 - 5.11 MIL/uL   Hemoglobin 11.4 (L) 12.0 - 15.0 g/dL   HCT 34.5 (L) 36.0 - 46.0 %   MCV 85.2 78.0 - 100.0 fL   MCH 28.1 26.0 - 34.0 pg   MCHC 33.0 30.0 - 36.0 g/dL   RDW 15.0 11.5 - 15.5 %   Platelets 261 150 - 400 K/uL   Neutrophils Relative % 89 %   Neutro Abs 15.4 (H) 1.7 - 7.7 K/uL   Lymphocytes Relative 10 %   Lymphs Abs 1.7 0.7 - 4.0 K/uL   Monocytes Relative 0 %   Monocytes Absolute 0.1 0.1 - 1.0 K/uL   Eosinophils Relative 1 %   Eosinophils Absolute 0.1 0.0 - 0.7 K/uL   Basophils Relative 0 %   Basophils Absolute 0.0 0.0 - 0.1 K/uL    Comment: Performed at [facility], [place] [address] [place], [place] [address]  Brain natriuretic peptide     Status: Abnormal   Collection Time: 11/09/17  9:16 AM  Result Value Ref Range   B Natriuretic  Peptide 201.4 (H) 0.0 - 100.0 pg/mL    Comment: Performed at [facility], [place] [address], [place], [place] [address]  Troponin I     Status: None   Collection Time: 11/09/17  9:16 AM  Result Value Ref Range   Troponin I <0.03 <0.03 ng/mL    Comment: Performed at [facility], [place] [address], [place], [place] [address]  I-Stat CG4 Lactic Acid, ED     Status: Abnormal   Collection Time: 11/09/17  9:26 AM  Result Value Ref Range   Lactic Acid, Venous 2.23 (HH) 0.5 - 1.9 mmol/L   Comment NOTIFIED PHYSICIAN   D-dimer, quantitative     Status: Abnormal   Collection Time: 11/09/17 12:34 PM  Result Value Ref Range   D-Dimer, Quant 1.69 (H) 0.00 - 0.50 ug/mL-FEU    Comment: (NOTE) At the manufacturer cut-off of 0.50 ug/mL FEU, this assay has been documented to exclude PE with a sensitivity and negative predictive value of 97 to 99%.  At this time, this assay has not been approved by the FDA to exclude DVT/VTE. Results should be correlated with clinical presentation. Performed at [facility], [place] [address], [place], [place] [address]   I-stat troponin, ED     Status: None   Collection Time: 11/09/17 12:38 PM  Result Value Ref Range   Troponin i, poc 0.02 0.00 - 0.08 ng/mL   Comment 3            Comment: Due to the release kinetics of cTnI, a negative result within the first hours of the onset of symptoms does not rule out myocardial infarction with certainty. If myocardial infarction is still suspected, repeat the test at appropriate intervals.   I-Stat CG4 Lactic Acid, ED     Status: None   Collection Time: 11/09/17 12:40 PM  Result Value Ref Range   Lactic Acid, Venous 1.33 0.5 - 1.9 mmol/L   Dg Chest 2 View  Result Date: 11/09/2017 CLINICAL DATA:  Dry cough for the past 4 days. EXAM: CHEST - 2 VIEW COMPARISON:  Chest x-ray dated March 02, 2015. FINDINGS: Right chest  wall port catheter with the tip in the proximal SVC.  The heart size and mediastinal contours are within normal limits. Tortuous thoracic aorta. Normal pulmonary vascularity. No focal consolidation, pleural effusion, or pneumothorax. No acute osseous abnormality. IMPRESSION: No active cardiopulmonary disease. Electronically Signed   By: Titus Dubin M.D.   On: 11/09/2017 10:10   Ct Angio Chest Pe W And/or Wo Contrast  Result Date: 11/09/2017 CLINICAL DATA:  Cough, congestion x4 days.  Hodgkin's lymphoma. EXAM: CT ANGIOGRAPHY CHEST WITH CONTRAST TECHNIQUE: Multidetector CT imaging of the chest was performed using the standard protocol during bolus administration of intravenous contrast. Multiplanar CT image reconstructions and MIPs were obtained to evaluate the vascular anatomy. CONTRAST:  56m ISOVUE-370 IOPAMIDOL (ISOVUE-370) INJECTION 76% COMPARISON:  PET-CT 09/17/2017 and previous FINDINGS: Cardiovascular: Heart size normal. Trace pericardial fluid. Right IJ port catheter extends to the lower SVC. Right ventricle nondilated. Satisfactory opacification of pulmonary arteries noted, and there is no evidence of pulmonary emboli. Adequate contrast opacification of the thoracic aorta. No evidence of dissection or stenosis. The ascending segment and arch are normal in caliber. There is classic 3-vessel brachiocephalic arch anatomy without proximal stenosis. Scattered calcified plaque in the arch and descending thoracic segment. There is fusiform dilatation of the descending thoracic aorta measuring 3.6 cm proximally, 4.4 cm maximum transverse diameter in its mid segment, 4.3 cm diameter just above and at the diaphragm. There is some eccentric nonocclusive mural thrombus in the distal descending segment. The proximal abdominal aorta measures 3.5 cm diameter just below the origin of the celiac axis, incompletely visualized distally. Mediastinum/Nodes: No hilar or mediastinal adenopathy. Lungs/Pleura: 0.7 cm angular probable intrapulmonary lymph node, right middle lobe  image 72/10, stable since 01/25/2014. Lungs are otherwise clear. No pleural effusion or pneumothorax. Upper Abdomen: Cholecystectomy clips. Mild pancreatic parenchymal atrophy. Stable 18.5 mm retroperitoneal nodule posterior to the liver with central coarse calcification. 1.2 cm left adrenal nodule stable since 01/25/2014 suggesting benign adrenal adenoma. No acute findings. Musculoskeletal: No chest wall abnormality. No acute or significant osseous findings. Review of the MIP images confirms the above findings. IMPRESSION: 1. Negative for acute PE or thoracic aortic dissection. 2. Stable 4.4 cm descending thoracic aortic aneurysm. Recommend semi-annual imaging followup by CTA or MRA and referral to cardiothoracic surgery if not already obtained. This recommendation follows 2010 ACCF/AHA/AATS/ACR/ASA/SCA/SCAI/SIR/STS/SVM Guidelines for the Diagnosis and Management of Patients With Thoracic Aortic Disease. Circulation. 2010; 121: e266-e36 3. Stable 1.8 cm right upper quadrant retroperitoneal nodule. Electronically Signed   By: DLucrezia EuropeM.D.   On: 11/09/2017 14:56    Pending Labs [employer](From admission, onward)   Start     Ordered   Signed and HOccupational hygienistmorning,   R     Signed and Held   Signed and Held  CBC  Tomorrow morning,   R     Signed and Held      Vitals/Pain Today's Vitals   11/09/17 1400 11/09/17 1500 11/09/17 1520 11/09/17 1530  BP: 121/76 133/72  134/66  Pulse: 82 84  82  Resp: 20 19  (!) 25  Temp:      TempSrc:      SpO2: 99% 99%  98%  PainSc:   0-No pain     Isolation Precautions No active isolations  Medications Medications  sodium chloride 0.9 % bolus 1,000 mL (0 mLs Intravenous Stopped 11/09/17 1059)  iopamidol (ISOVUE-300) 61 % injection (  Contrast Given 11/09/17 1520)  iopamidol (ISOVUE-370)  76 % injection (76 mLs  Contrast Given 11/09/17 1423)    Mobility walks

## 2017-11-10 ENCOUNTER — Ambulatory Visit: Payer: Medicare HMO

## 2017-11-10 ENCOUNTER — Other Ambulatory Visit: Payer: Medicare HMO

## 2017-11-10 ENCOUNTER — Ambulatory Visit: Payer: Medicare HMO | Admitting: Hematology

## 2017-11-10 DIAGNOSIS — D6481 Anemia due to antineoplastic chemotherapy: Secondary | ICD-10-CM | POA: Diagnosis not present

## 2017-11-10 DIAGNOSIS — Z95828 Presence of other vascular implants and grafts: Secondary | ICD-10-CM

## 2017-11-10 DIAGNOSIS — T451X5A Adverse effect of antineoplastic and immunosuppressive drugs, initial encounter: Secondary | ICD-10-CM | POA: Diagnosis not present

## 2017-11-10 DIAGNOSIS — C8198 Hodgkin lymphoma, unspecified, lymph nodes of multiple sites: Secondary | ICD-10-CM | POA: Diagnosis not present

## 2017-11-10 DIAGNOSIS — R55 Syncope and collapse: Secondary | ICD-10-CM

## 2017-11-10 LAB — BASIC METABOLIC PANEL
BUN: 13 mg/dL (ref 6–20)
CO2: 22 mmol/L (ref 22–32)
Chloride: 107 mmol/L (ref 101–111)
Creatinine, Ser: 0.61 mg/dL (ref 0.44–1.00)
GFR calc Af Amer: >60 mL/min (ref >60)
GFR calc non Af Amer: >60 mL/min (ref >60)
Potassium: 3.7 mmol/L (ref 3.5–5.1)
Sodium: 137 mmol/L (ref 135–145)

## 2017-11-10 LAB — CBC
HCT: 28.5 % — ABNORMAL LOW (ref 36.0–46.0)
Hemoglobin: 9.1 g/dL — ABNORMAL LOW (ref 12.0–15.0)
MCH: 27.1 pg (ref 26.0–34.0)
MCHC: 31.9 g/dL (ref 30.0–36.0)
MCV: 84.8 fL (ref 78.0–100.0)
Platelets: 217 10*3/uL (ref 150–400)
RBC: 3.36 MIL/uL — ABNORMAL LOW (ref 3.87–5.11)
RDW: 15.1 % (ref 11.5–15.5)
WBC: 4.3 10*3/uL (ref 4.0–10.5)

## 2017-11-10 LAB — BASIC METABOLIC PANEL WITH GFR
Anion gap: 8 (ref 5–15)
Calcium: 8.4 mg/dL — ABNORMAL LOW (ref 8.9–10.3)
Glucose, Bld: 88 mg/dL (ref 65–99)

## 2017-11-10 NOTE — Discharge Summary (Signed)
Discharge Summary  Jennifer Juarez MWN:[id number] DOB: 09/02/1945  PCP: Kathyrn Lass, MD  Admit date: 11/09/2017 Discharge date: 11/10/2017  Time spent: 25 mins   Recommendations for Outpatient Follow-up:  1. PCP 2. Oncology   Discharge Diagnoses:  Active Hospital Problems   Diagnosis Date Noted  . Pre-syncope 11/09/2017  . Port-A-Cath in place 10/27/2017  . Hodgkin's lymphoma ([place]) 09/26/2017    Resolved Hospital Problems  No resolved problems to display.    Discharge Condition: Stable   Diet recommendation: Heart healthy  Vitals:   11/09/17 2029 11/10/17 0512  BP: 132/74 (!) 141/79  Pulse: 82 (!) 115  Resp: 18 18  Temp: 99.1 F (37.3 C) 98.7 F (37.1 C)  SpO2: 97% 96%    History of present illness:  Jennifer Juarez is a 72 y.o. female with medical history significant of Hodgkin's lymphoma status post cycle 2 of doxorubicin, vincristine, dexamethasone on 11/04/2017 who comes in with dry cough for 3 days and ??presyncope/lightheadedness. Pt denied any chest pain, shortness of breath, nausea, vomiting, diarrhea, syncope. In the ED vital signs were notable for mild tachycardia that was intermittent.  Labs are notable for a white blood cell count of 17.3 (on G-CSF), D-dimer was elevated.  Chest x-ray was normal.  CTA PE study showed no acute process other than a 4.4 descending thoracic aortic aneurysm.  Patient was admitted for presyncope evaluation.  Today, pt reported feeling better, resolved dizziness, with resolving cough. Denies any chest pain, SOB, diarrhea, fever/chills, abdominal pain. Pt eager to be discharged, will follow up closely with oncology.  Hospital Course:  Principal Problem:   Pre-syncope Active Problems:   Hodgkin's lymphoma ([place])   Port-A-Cath in place  Presyncope likely due to dehydration in a pt receiving chemotherapy Resolved Afebrile, no leukocytosis Work up currently unremarkable: CXR, ECHO, CT chest S/P IVF Patient encouraged to stay  hydrated and follow up with oncology and PCP  Acute bronchitis Afebrile, no leukocytosis CT or chest x-ray negative Follow up with PCP  Acute on chronic anemia Likely due to hemodilution in addition to being on chemotherapy PCP and oncology to monitor closely  Mixed cellularity stage I Hodgkin's lymphoma Follow up with oncology   Procedures:  None   Consultations:  None  Discharge Exam: BP (!) 141/79 (BP Location: Right Arm)   Pulse (!) 115   Temp 98.7 F (37.1 C) (Oral)   Resp 18   Ht 5\' 8"  (1.727 m)   Wt 90.4 kg (199 lb 4.8 oz)   SpO2 96%   BMI 30.30 kg/m   General: NAD Cardiovascular: S1, S2 present Respiratory: CTAB  Discharge Instructions You were cared for by a hospitalist during your hospital stay. If you have any questions about your discharge medications or the care you received while you were in the hospital after you are discharged, you can call the unit and asked to speak with the hospitalist on call if the hospitalist that took care of you is not available. Once you are discharged, your primary care physician will handle any further medical issues. Please note that NO REFILLS for any discharge medications will be authorized once you are discharged, as it is imperative that you return to your primary care physician (or establish a relationship with a primary care physician if you do not have one) for your aftercare needs so that they can reassess your need for medications and monitor your lab values.  Discharge Instructions    Diet - low sodium heart healthy  Complete by:  As directed    Increase activity slowly   Complete by:  As directed      Allergies as of 11/10/2017      Reactions   Cephalexin Anaphylaxis   Tongue and throat swelling   Doxycycline Diarrhea, Nausea And Vomiting   Levofloxacin Other (See Comments)   Tachycardia   Prilosec [omeprazole] Other (See Comments)   Makes pt dizzy  ** Any Family Meds   Singulair [montelukast]     Rapid Heart rate      Medication List    TAKE these medications   acetaminophen 500 MG tablet Commonly known as:  TYLENOL Take 500 mg by mouth every 8 (eight) hours as needed for mild pain or moderate pain.   dexamethasone 4 MG tablet Commonly known as:  DECADRON Take 2 tablets by mouth once a day on the day after chemotherapy and then take 2 tablets two times a day for 2 days. Take with food.   lidocaine-prilocaine cream Commonly known as:  EMLA Apply to affected area once What changed:    how much to take  how to take this  when to take this  reasons to take this  additional instructions   LORazepam 0.5 MG tablet Commonly known as:  ATIVAN Take 1 tablet (0.5 mg total) by mouth every 8 (eight) hours as needed for anxiety (nausea).   NASACORT AQ 55 MCG/ACT Aero nasal inhaler Generic drug:  triamcinolone Place 2 sprays into the nose daily as needed (allergies).   ondansetron 8 MG tablet Commonly known as:  ZOFRAN Take 1 tablet (8 mg total) by mouth 2 (two) times daily as needed. Start on the third day after chemotherapy.   polyethylene glycol packet Commonly known as:  MIRALAX / GLYCOLAX Take 17 g by mouth daily.   prochlorperazine 10 MG tablet Commonly known as:  COMPAZINE Take 1 tablet (10 mg total) by mouth every 6 (six) hours as needed (Nausea or vomiting).   promethazine 25 MG suppository Commonly known as:  PHENERGAN Place 1 suppository (25 mg total) rectally every 6 (six) hours as needed for nausea or vomiting.      Allergies  Allergen Reactions  . Cephalexin Anaphylaxis    Tongue and throat swelling  . Doxycycline Diarrhea and Nausea And Vomiting  . Levofloxacin Other (See Comments)    Tachycardia  . Prilosec [Omeprazole] Other (See Comments)    Makes pt dizzy   ** Any Family Meds  . Singulair [Montelukast]     Rapid Heart rate   Follow-up Information    Kathyrn Lass, MD. Schedule an appointment as soon as possible for a visit in 1 week(s).    Specialty:  Family Medicine Contact information: [address] [place] [id number] [phone]            The results of significant diagnostics from this hospitalization (including imaging, microbiology, ancillary and laboratory) are listed below for reference.    Significant Diagnostic Studies: Dg Chest 2 View  Result Date: 11/09/2017 CLINICAL DATA:  Dry cough for the past 4 days. EXAM: CHEST - 2 VIEW COMPARISON:  Chest x-ray dated March 02, 2015. FINDINGS: Right chest wall port catheter with the tip in the proximal SVC. The heart size and mediastinal contours are within normal limits. Tortuous thoracic aorta. Normal pulmonary vascularity. No focal consolidation, pleural effusion, or pneumothorax. No acute osseous abnormality. IMPRESSION: No active cardiopulmonary disease. Electronically Signed   By: Titus Dubin M.D.   On: 11/09/2017 10:10  Ct Angio Chest Pe W And/or Wo Contrast  Result Date: 11/09/2017 CLINICAL DATA:  Cough, congestion x4 days.  Hodgkin's lymphoma. EXAM: CT ANGIOGRAPHY CHEST WITH CONTRAST TECHNIQUE: Multidetector CT imaging of the chest was performed using the standard protocol during bolus administration of intravenous contrast. Multiplanar CT image reconstructions and MIPs were obtained to evaluate the vascular anatomy. CONTRAST:  108mL ISOVUE-370 IOPAMIDOL (ISOVUE-370) INJECTION 76% COMPARISON:  PET-CT 09/17/2017 and previous FINDINGS: Cardiovascular: Heart size normal. Trace pericardial fluid. Right IJ port catheter extends to the lower SVC. Right ventricle nondilated. Satisfactory opacification of pulmonary arteries noted, and there is no evidence of pulmonary emboli. Adequate contrast opacification of the thoracic aorta. No evidence of dissection or stenosis. The ascending segment and arch are normal in caliber. There is classic 3-vessel brachiocephalic arch anatomy without proximal stenosis. Scattered calcified plaque in the arch and descending  thoracic segment. There is fusiform dilatation of the descending thoracic aorta measuring 3.6 cm proximally, 4.4 cm maximum transverse diameter in its mid segment, 4.3 cm diameter just above and at the diaphragm. There is some eccentric nonocclusive mural thrombus in the distal descending segment. The proximal abdominal aorta measures 3.5 cm diameter just below the origin of the celiac axis, incompletely visualized distally. Mediastinum/Nodes: No hilar or mediastinal adenopathy. Lungs/Pleura: 0.7 cm angular probable intrapulmonary lymph node, right middle lobe image 72/10, stable since 01/25/2014. Lungs are otherwise clear. No pleural effusion or pneumothorax. Upper Abdomen: Cholecystectomy clips. Mild pancreatic parenchymal atrophy. Stable 18.5 mm retroperitoneal nodule posterior to the liver with central coarse calcification. 1.2 cm left adrenal nodule stable since 01/25/2014 suggesting benign adrenal adenoma. No acute findings. Musculoskeletal: No chest wall abnormality. No acute or significant osseous findings. Review of the MIP images confirms the above findings. IMPRESSION: 1. Negative for acute PE or thoracic aortic dissection. 2. Stable 4.4 cm descending thoracic aortic aneurysm. Recommend semi-annual imaging followup by CTA or MRA and referral to cardiothoracic surgery if not already obtained. This recommendation follows 2010 ACCF/AHA/AATS/ACR/ASA/SCA/SCAI/SIR/STS/SVM Guidelines for the Diagnosis and Management of Patients With Thoracic Aortic Disease. Circulation. 2010; 121: e266-e36 3. Stable 1.8 cm right upper quadrant retroperitoneal nodule. Electronically Signed   By: Lucrezia Europe M.D.   On: 11/09/2017 14:56    Microbiology: No results found for this or any previous visit (from the past 240 hour(s)).   Labs: Basic Metabolic Panel: Recent Labs  Lab 11/04/17 0735 11/09/17 0916 11/10/17 0524  NA 139 137 137  K 3.8 3.6 3.7  CL 108 105 107  CO2 24 20* 22  GLUCOSE 99 103* 88  BUN 9 19 13     CREATININE 0.75 0.78 0.61  CALCIUM 9.6 9.2 8.4*   Liver Function Tests: Recent Labs  Lab 11/04/17 0735 11/09/17 0916  AST 17 30  ALT 13 37  ALKPHOS 102 87  BILITOT 0.3 0.7  PROT 6.8 6.7  ALBUMIN 3.3* 3.4*   No results for input(s): LIPASE, AMYLASE in the last 168 hours. No results for input(s): AMMONIA in the last 168 hours. CBC: Recent Labs  Lab 11/04/17 0735 11/09/17 0916 11/10/17 0524  WBC 11.8* 17.3* 4.3  NEUTROABS 8.3* 15.4*  --   HGB 11.1* 11.4* 9.1*  HCT 34.8 34.5* 28.5*  MCV 86.1 85.2 84.8  PLT 298 261 217   Cardiac Enzymes: Recent Labs  Lab 11/09/17 0916  TROPONINI <0.03   BNP: BNP (last 3 results) Recent Labs    11/09/17 0916  BNP 201.4*    ProBNP (last 3 results) No results for input(s): PROBNP  in the last 8760 hours.  CBG: No results for input(s): GLUCAP in the last 168 hours.     Signed:  Alma Friendly, MD [facility] 11/10/2017, 11:59 AM

## 2017-11-11 ENCOUNTER — Telehealth: Payer: Self-pay | Admitting: *Deleted

## 2017-11-11 ENCOUNTER — Other Ambulatory Visit: Payer: Self-pay | Admitting: *Deleted

## 2017-11-11 MED ORDER — AZITHROMYCIN 250 MG PO TABS: ORAL_TABLET | ORAL | 0 refills | Status: DC

## 2017-11-11 NOTE — Telephone Encounter (Signed)
Pt called c/o persistent dry cough.  Pt was recently hospitalized, cxr negative, echo negative.  No medications prescribed.  Pt denies fever/chills, other signs of infection.  Per Dr. Irene Limbo, will prescribe z-pak.  Pt verbalized understanding.  Rx e-scribed to pt pharmacy.  Will f/u with patient on 5/28 appt.

## 2017-11-14 ENCOUNTER — Telehealth: Payer: Self-pay | Admitting: *Deleted

## 2017-11-14 NOTE — Telephone Encounter (Signed)
Pt called stating still having a lot of congestion since taking z-pak.   Patient states congestion is better since taking z-pak, but wondering if she is not 100% better, if this will delay treatment on Tuesday.  Informed patient that entire course of z-pak needs to be completed.  Pt denies fever/chills/other signs of infection.  Instructed pt to inform RN on Tuesday during treatment if symptoms still occur.  If symptoms persist, patient could be seen by Sandi Mealy in symptom management clinic.  Pt additionally asked if Dr. Irene Limbo has given answer about whether or not neulasta vs granix would be given.  Informed pt that message has been sent to Dr. Irene Limbo regarding this concern and that patient should mention to nurse prior to receiving neulasta during treatment on Tuesday.

## 2017-11-18 ENCOUNTER — Telehealth: Payer: Self-pay

## 2017-11-18 ENCOUNTER — Inpatient Hospital Stay: Payer: Medicare HMO

## 2017-11-18 ENCOUNTER — Inpatient Hospital Stay (HOSPITAL_BASED_OUTPATIENT_CLINIC_OR_DEPARTMENT_OTHER): Payer: Medicare HMO | Admitting: Hematology

## 2017-11-18 ENCOUNTER — Encounter: Payer: Self-pay | Admitting: Hematology

## 2017-11-18 VITALS — BP 133/81 | HR 103 | Temp 98.5°F | Resp 17 | Ht 68.0 in | Wt 194.4 lb

## 2017-11-18 DIAGNOSIS — Z87891 Personal history of nicotine dependence: Secondary | ICD-10-CM

## 2017-11-18 DIAGNOSIS — J3489 Other specified disorders of nose and nasal sinuses: Secondary | ICD-10-CM

## 2017-11-18 DIAGNOSIS — C8121 Mixed cellularity classical Hodgkin lymphoma, lymph nodes of head, face, and neck: Secondary | ICD-10-CM

## 2017-11-18 DIAGNOSIS — Z7189 Other specified counseling: Secondary | ICD-10-CM

## 2017-11-18 DIAGNOSIS — M898X9 Other specified disorders of bone, unspecified site: Secondary | ICD-10-CM

## 2017-11-18 DIAGNOSIS — Z95828 Presence of other vascular implants and grafts: Secondary | ICD-10-CM

## 2017-11-18 DIAGNOSIS — R6883 Chills (without fever): Secondary | ICD-10-CM

## 2017-11-18 DIAGNOSIS — R05 Cough: Secondary | ICD-10-CM

## 2017-11-18 DIAGNOSIS — D702 Other drug-induced agranulocytosis: Secondary | ICD-10-CM

## 2017-11-18 DIAGNOSIS — Z5111 Encounter for antineoplastic chemotherapy: Secondary | ICD-10-CM | POA: Diagnosis not present

## 2017-11-18 DIAGNOSIS — J069 Acute upper respiratory infection, unspecified: Secondary | ICD-10-CM

## 2017-11-18 LAB — CMP (CANCER CENTER ONLY)
ALBUMIN: 3.3 g/dL — AB (ref 3.5–5.0)
ALT: 14 U/L (ref 0–55)
AST: 15 U/L (ref 5–34)
Alkaline Phosphatase: 89 U/L (ref 40–150)
Anion gap: 9 (ref 3–11)
BUN: 8 mg/dL (ref 7–26)
CHLORIDE: 105 mmol/L (ref 98–109)
CO2: 22 mmol/L (ref 22–29)
CREATININE: 0.72 mg/dL (ref 0.60–1.10)
Calcium: 9.4 mg/dL (ref 8.4–10.4)
GFR, Est AFR Am: >60 mL/min (ref >60)
GFR, Estimated: >60 mL/min (ref >60)
GLUCOSE: 95 mg/dL (ref 70–140)
Potassium: 3.8 mmol/L (ref 3.5–5.1)
Sodium: 136 mmol/L (ref 136–145)
Total Bilirubin: 0.4 mg/dL (ref 0.2–1.2)
Total Protein: 6.8 g/dL (ref 6.4–8.3)

## 2017-11-18 LAB — CBC WITH DIFFERENTIAL/PLATELET
Basophils Absolute: 0.1 10*3/uL (ref 0.0–0.1)
Basophils Relative: 1 %
EOS ABS: 0 10*3/uL (ref 0.0–0.5)
EOS PCT: 0 %
HCT: 31.6 % — ABNORMAL LOW (ref 34.8–46.6)
HEMOGLOBIN: 10.3 g/dL — AB (ref 11.6–15.9)
LYMPHS ABS: 1.3 10*3/uL (ref 0.9–3.3)
Lymphocytes Relative: 8 %
MCH: 27.4 pg (ref 25.1–34.0)
MCHC: 32.6 g/dL (ref 31.5–36.0)
MCV: 84.2 fL (ref 79.5–101.0)
MONOS PCT: 6 %
Monocytes Absolute: 1 10*3/uL — ABNORMAL HIGH (ref 0.1–0.9)
Neutro Abs: 14 10*3/uL — ABNORMAL HIGH (ref 1.5–6.5)
Neutrophils Relative %: 85 %
PLATELETS: 349 10*3/uL (ref 145–400)
RBC: 3.75 MIL/uL (ref 3.70–5.45)
RDW: 16.5 % — ABNORMAL HIGH (ref 11.2–14.5)
WBC: 16.4 10*3/uL — ABNORMAL HIGH (ref 3.9–10.3)

## 2017-11-18 MED ORDER — SODIUM CHLORIDE 0.9% FLUSH
10.0000 mL | INTRAVENOUS | Status: DC | PRN
  Administered 2017-11-18: 10 mL
  Filled 2017-11-18: qty 10

## 2017-11-18 MED ORDER — SODIUM CHLORIDE 0.9 % IV SOLN
375.0000 mg/m2 | Freq: Once | INTRAVENOUS | Status: AC
  Administered 2017-11-18: 800 mg via INTRAVENOUS
  Filled 2017-11-18: qty 80

## 2017-11-18 MED ORDER — SODIUM CHLORIDE 0.9% FLUSH
10.0000 mL | Freq: Once | INTRAVENOUS | Status: AC
  Administered 2017-11-18: 10 mL
  Filled 2017-11-18: qty 10

## 2017-11-18 MED ORDER — SODIUM CHLORIDE 0.9 % IV SOLN
Freq: Once | INTRAVENOUS | Status: AC
  Administered 2017-11-18: 15:00:00 via INTRAVENOUS

## 2017-11-18 MED ORDER — PALONOSETRON HCL INJECTION 0.25 MG/5ML
0.2500 mg | Freq: Once | INTRAVENOUS | Status: AC
  Administered 2017-11-18: 0.25 mg via INTRAVENOUS

## 2017-11-18 MED ORDER — VINBLASTINE SULFATE CHEMO INJECTION 1 MG/ML
6.1000 mg/m2 | Freq: Once | INTRAVENOUS | Status: AC
  Administered 2017-11-18: 13 mg via INTRAVENOUS
  Filled 2017-11-18: qty 13

## 2017-11-18 MED ORDER — FOSAPREPITANT DIMEGLUMINE INJECTION 150 MG
Freq: Once | INTRAVENOUS | Status: AC
  Administered 2017-11-18: 15:00:00 via INTRAVENOUS
  Filled 2017-11-18: qty 5

## 2017-11-18 MED ORDER — HEPARIN SOD (PORK) LOCK FLUSH 100 UNIT/ML IV SOLN
500.0000 [IU] | Freq: Once | INTRAVENOUS | Status: AC | PRN
  Administered 2017-11-18: 500 [IU]
  Filled 2017-11-18: qty 5

## 2017-11-18 MED ORDER — DOXORUBICIN HCL CHEMO IV INJECTION 2 MG/ML
25.0000 mg/m2 | Freq: Once | INTRAVENOUS | Status: AC
  Administered 2017-11-18: 54 mg via INTRAVENOUS
  Filled 2017-11-18: qty 27

## 2017-11-18 MED ORDER — AZITHROMYCIN 250 MG PO TABS: ORAL_TABLET | ORAL | 0 refills | Status: DC

## 2017-11-18 MED ORDER — PALONOSETRON HCL INJECTION 0.25 MG/5ML
INTRAVENOUS | Status: AC
  Filled 2017-11-18: qty 5

## 2017-11-18 NOTE — Telephone Encounter (Signed)
High priority scheduling message sent to have injection appointments added for pt to receive granix for the next four days.

## 2017-11-18 NOTE — Progress Notes (Signed)
HEMATOLOGY/ONCOLOGY CLINIC NOTE  Date of Service: 11/18/2017   Patient Care Team: Kathyrn Lass, MD as PCP - General (Family Medicine)  CHIEF COMPLAINTS/PURPOSE OF CONSULTATION:  F/u for continued mx of  Hodgkin's Lymphoma   HISTORY OF PRESENTING ILLNESS:   Jennifer Juarez is a wonderful 72 y.o. female who has been referred to [place] by Dr. Lattie Haw Miller/Courtney Rolland Porter PA_C  for evaluation and management of likely newly diagnosed Hodgkin's Lymphoma.   The pt reports that she is doing very well overall and notes that she is fully functional and does not have any significant chronic medical problems or any functional limitations.  She notes that on 06/26/17 she noticed a knot on the left side of her neck that subsequently precipitated her CT scan and Bx prior to seeing [place] today.   On 07/28/17 the pt had a CT Soft Tissue Neck revealing Enlarged lymph nodes in the left neck compatible with neoplasm.Enlarged lymph nodes in the left neck. Large left level 2 lymph node 23 x 30 mm. Multiple posterior lymph nodes are present measuring up to 10 mm. No enlarged lymph nodes in the right neck. Biopsy recommended. Attention left tonsil on direct mucosal inspection. Possible tonsillar carcinoma on the left. Lymphoma also in the differential.    Of note prior to the patient's visit, pt has had a Lymph node Needle/Core biopsy completed on 08/12/17 with results revealing ATYPICAL LYMPHOID PROLIFERATION. Immunohistochemical stains were performed including LCA, CD20, PAX-5, CD3, CD15, and CD30 with appropriate controls. The large atypical lymphoid-appearing cells are positive for CD30, CD15 and PAX-5 and negative for CD3, CD20 and LCA. The lymphocytic population in the background show a mixture of T and B-cells with predominance of T-cells. The overall findings are limited but atypical and worrisome for a lymphoproliferative process, particularly. classical Hodgkin lymphoma. Excisional biopsy is strongly  recommended.  Also on 08/12/17 the pt had a Tissue Flow Cytometry revealing NO MONOCLONAL B-CELL POPULATION OR ABNORMAL T-CELL PHENOTYPE IDENTIFIED.   Most recent CBC with Diff. results (07/15/17) revealed all values WNL.   She notes that she has had a viral infection in 2005 that resulted in a complete loss of hearing in her right ear. She notes that her left ear has had fluid build up and has resulted in significant loss of hearing, for which she uses hearing aids.  On review of systems, pt reports good energy levels, and denies fevers, chills, night sweats, unexpected weight loss, skin itching, rashes, soreness, sore throat, difficulties swallowing, back pains, abdominal pains, leg swelling, and any other symptoms.   On PMHx the pt reports arthritis, vertigo, polyps. She denies heart, lung, kidney or liver problems. She denies DM.  On Social Hx the pt notes having quit smoking when she was 40. She reports infrequent EOH consumption. She denies chemical or radiation exposure.  On Family Hx she reports that her mother had liver cancer. Her sister had breast cancer at 56 y/o and pancreatic cancer at 74 y/o.  Interval History:   Jennifer Juarez is here for a scheduled follow-up of her classical Hodgkin's Lymphoma. She is doing well. However, she reports having an upper respiratory infection after being discharge from the hospital on 11/09/2017 for a near syncopal episode. She reports severe bone pain after getting Neulasta that caused her to feel as if she was going to pass out. It did not help that she did not have an appetite. After being discharged she was given a zpak and reports yellow mucous at first,  but now it is clear. Associated symptoms include rhinorrhea, cough and chills. She is taking Claritin everyday, but she has not noticed a difference in her symptoms. [name] states that [place] always worked better for her. She has been feeling better and would like to continue with her treatment today.   Most  recent lab results (11/18/17) of CBC and CMP as follows: all labs are WNL except for WBC at 16.4, hemoglobin at 10.3, HCT at 31.6, RDW at 16.5, Neutro Abs at 14.0, Monocytes Absolute at 1.0, Albumin at 3.3.   On review of systems, pt denies fever, chills, weight loss, decreased appetite, decreased energy levels, mouth sores and bilateral lower extremity edema. Denies pain. Pt denies abdominal pain, nausea, vomiting and bladder and bowel changes.      MEDICAL HISTORY:  Past Medical History:  Diagnosis Date  . BPPV (benign paroxysmal positional vertigo)   . Cancer ([place])   . H/O seasonal allergies   . Hearing loss in right ear   . Left cervical lymphadenopathy   . Macular degeneration   . Obesity   . Osteopenia   . Pre-syncope 11/09/2017  . Retinal vein occlusion   . Vitamin D deficiency     SURGICAL HISTORY: Past Surgical History:  Procedure Laterality Date  . CHOLECYSTECTOMY    . IR FLUORO GUIDE PORT INSERTION RIGHT  10/10/2017  . IR US GUIDE VASC ACCESS RIGHT  10/10/2017  . MASS EXCISION Left 09/22/2017   Procedure: LEFT CERVICAL LYMPH NODE EXCISION;  Surgeon: Izora Gala, MD;  Location: [facility];  Service: ENT;  Laterality: Left;  . TUBAL LIGATION      SOCIAL HISTORY: Social History   Socioeconomic History  . Marital status: Single    Spouse name: Not on file  . Number of children: Not on file  . Years of education: Not on file  . Highest education level: Not on file  Occupational History  . Not on file  Social Needs  . Financial resource strain: Not on file  . Food insecurity:    Worry: Not on file    Inability: Not on file  . Transportation needs:    Medical: Not on file    Non-medical: Not on file  Tobacco Use  . Smoking status: Former Smoker    Last attempt to quit: 06/24/1988    Years since quitting: 29.4  . Smokeless tobacco: Never Used  Substance and Sexual Activity  . Alcohol use: No  . Drug use: No  . Sexual activity: Not on file   Lifestyle  . Physical activity:    Days per week: Not on file    Minutes per session: Not on file  . Stress: Not on file  Relationships  . Social connections:    Talks on phone: Not on file    Gets together: Not on file    Attends religious service: Not on file    Active member of club or organization: Not on file    Attends meetings of clubs or organizations: Not on file    Relationship status: Not on file  . Intimate partner violence:    Fear of current or ex partner: Not on file    Emotionally abused: Not on file    Physically abused: Not on file    Forced sexual activity: Not on file  Other Topics Concern  . Not on file  Social History Narrative  . Not on file    FAMILY HISTORY: Family History  Problem Relation  Age of Onset  . Cancer Mother        liver  . Heart attack Father   . Hypertension Father   . Diabetes Mellitus I Father   . Cancer Sister        pancreatic, breast  . Diabetes Mellitus I Sister   . Heart disease Brother   . Heart disease Brother   . Heart disease Brother   . Diabetes Mellitus I Sister   . Diabetes Mellitus I Sister     ALLERGIES:  is allergic to cephalexin; doxycycline; levofloxacin; prilosec [omeprazole]; and singulair [montelukast].  MEDICATIONS:  Current Outpatient Medications  Medication Sig Dispense Refill  . acetaminophen (TYLENOL) 500 MG tablet Take 500 mg by mouth every 8 (eight) hours as needed for mild pain or moderate pain.     Marland Kitchen azithromycin (ZITHROMAX Z-PAK) 250 MG tablet Take 2 tablets by mouth on day one, then take 1 tablet daily for 4 days. 6 each 0  . dexamethasone (DECADRON) 4 MG tablet Take 2 tablets by mouth once a day on the day after chemotherapy and then take 2 tablets two times a day for 2 days. Take with food. 30 tablet 1  . lidocaine-prilocaine (EMLA) cream Apply to affected area once (Patient taking differently: Apply 1 application topically daily as needed. Port) 30 g 3  . LORazepam (ATIVAN) 0.5 MG tablet  Take 1 tablet (0.5 mg total) by mouth every 8 (eight) hours as needed for anxiety (nausea). 30 tablet 0  . ondansetron (ZOFRAN) 8 MG tablet Take 1 tablet (8 mg total) by mouth 2 (two) times daily as needed. Start on the third day after chemotherapy. 30 tablet 1  . polyethylene glycol (MIRALAX / GLYCOLAX) packet Take 17 g by mouth daily.    . prochlorperazine (COMPAZINE) 10 MG tablet Take 1 tablet (10 mg total) by mouth every 6 (six) hours as needed (Nausea or vomiting). 30 tablet 1  . promethazine (PHENERGAN) 25 MG suppository Place 1 suppository (25 mg total) rectally every 6 (six) hours as needed for nausea or vomiting. 12 suppository 1  . triamcinolone (NASACORT AQ) 55 MCG/ACT AERO nasal inhaler Place 2 sprays into the nose daily as needed (allergies).      No current facility-administered medications for this visit.     REVIEW OF SYSTEMS:    .10 Point review of Systems was done is negative except as noted above.  PHYSICAL EXAMINATION: ECOG PERFORMANCE STATUS: 1 BP 133/81 (BP Location: Left Arm, Patient Position: Sitting)   Pulse (!) 103   Temp 98.5 F (36.9 C) (Oral)   Resp 17   Ht 5\' 8"  (1.727 m)   Wt 194 lb 6.4 oz (88.2 kg)   SpO2 98%   BMI 29.56 kg/m    GENERAL:alert, in no acute distress and comfortable SKIN: no acute rashes, no significant lesions EYES: conjunctiva are pink and non-injected, sclera anicteric OROPHARYNX: MMM, no exudates, no oropharyngeal erythema or ulceration NECK: supple, no JVD LYMPH:  no palpable lymphadenopathy in the cervical, axillary or inguinal regions LUNGS: clear to auscultation b/l with normal respiratory effort HEART: regular rate & rhythm ABDOMEN:  normoactive bowel sounds , non tender, not distended. Extremity: no pedal edema PSYCH: alert & oriented x 3 with fluent speech NEURO: no focal motor/sensory deficits   LABORATORY DATA:  I have reviewed the data as listed  . CBC Latest Ref Rng & Units 11/18/2017 11/10/2017 11/09/2017  WBC  3.9 - 10.3 K/uL 16.4(H) 4.3 17.3(H)  Hemoglobin 11.6 - 15.9  g/dL 10.3(L) 9.1(L) 11.4(L)  Hematocrit 34.8 - 46.6 % 31.6(L) 28.5(L) 34.5(L)  Platelets 145 - 400 K/uL 349 217 261  ANC 8.3   CMP Latest Ref Rng & Units 11/04/2017  Glucose 65 - 99 mg/dL 99  BUN 6 - 20 mg/dL 9  Creatinine 0.44 - 1.00 mg/dL 0.75  Sodium 135 - 145 mmol/L 139  Potassium 3.5 - 5.1 mmol/L 3.8  Chloride 101 - 111 mmol/L 108  CO2 22 - 32 mmol/L 24  Calcium 8.9 - 10.3 mg/dL 9.6  Total Protein 6.5 - 8.1 g/dL 6.8  Total Bilirubin 0.3 - 1.2 mg/dL 0.3  Alkaline Phos 38 - 126 U/L 102  AST 15 - 41 U/L 17  ALT 14 - 54 U/L 13   Component     Latest Ref Rng & Units 08/29/2017  Retic Ct Pct     0.7 - 2.1 % 0.8  RBC.     3.70 - 5.45 MIL/uL 4.60  Retic Count, Absolute     33.7 - 90.7 K/uL 36.8  Sed Rate     0 - 22 mm/hr 30 (H)  LDH     125 - 245 U/L 179  HCV Ab     0.0 - 0.9 s/co ratio <0.1  Hepatitis B Surface Ag     Negative Negative  Hep B Core Ab, Tot     Negative Negative     08/12/17 Lymph Node Needle/core Biopsy:   08/12/17 Tissue Flow Cytometry:    09/22/17 Tissue Flow Cytometry    09/22/17 Lymph Node Pathology:   RADIOGRAPHIC STUDIES: I have personally reviewed the radiological images as listed and agreed with the findings in the report. Dg Chest 2 View  Result Date: 11/09/2017 CLINICAL DATA:  Dry cough for the past 4 days. EXAM: CHEST - 2 VIEW COMPARISON:  Chest x-ray dated March 02, 2015. FINDINGS: Right chest wall port catheter with the tip in the proximal SVC. The heart size and mediastinal contours are within normal limits. Tortuous thoracic aorta. Normal pulmonary vascularity. No focal consolidation, pleural effusion, or pneumothorax. No acute osseous abnormality. IMPRESSION: No active cardiopulmonary disease. Electronically Signed   By: Titus Dubin M.D.   On: 11/09/2017 10:10   Ct Angio Chest Pe W And/or Wo Contrast  Result Date: 11/09/2017 CLINICAL DATA:  Cough, congestion x4  days.  Hodgkin's lymphoma. EXAM: CT ANGIOGRAPHY CHEST WITH CONTRAST TECHNIQUE: Multidetector CT imaging of the chest was performed using the standard protocol during bolus administration of intravenous contrast. Multiplanar CT image reconstructions and MIPs were obtained to evaluate the vascular anatomy. CONTRAST:  67mL ISOVUE-370 IOPAMIDOL (ISOVUE-370) INJECTION 76% COMPARISON:  PET-CT 09/17/2017 and previous FINDINGS: Cardiovascular: Heart size normal. Trace pericardial fluid. Right IJ port catheter extends to the lower SVC. Right ventricle nondilated. Satisfactory opacification of pulmonary arteries noted, and there is no evidence of pulmonary emboli. Adequate contrast opacification of the thoracic aorta. No evidence of dissection or stenosis. The ascending segment and arch are normal in caliber. There is classic 3-vessel brachiocephalic arch anatomy without proximal stenosis. Scattered calcified plaque in the arch and descending thoracic segment. There is fusiform dilatation of the descending thoracic aorta measuring 3.6 cm proximally, 4.4 cm maximum transverse diameter in its mid segment, 4.3 cm diameter just above and at the diaphragm. There is some eccentric nonocclusive mural thrombus in the distal descending segment. The proximal abdominal aorta measures 3.5 cm diameter just below the origin of the celiac axis, incompletely visualized distally. Mediastinum/Nodes: No hilar or mediastinal adenopathy. Lungs/Pleura:  0.7 cm angular probable intrapulmonary lymph node, right middle lobe image 72/10, stable since 01/25/2014. Lungs are otherwise clear. No pleural effusion or pneumothorax. Upper Abdomen: Cholecystectomy clips. Mild pancreatic parenchymal atrophy. Stable 18.5 mm retroperitoneal nodule posterior to the liver with central coarse calcification. 1.2 cm left adrenal nodule stable since 01/25/2014 suggesting benign adrenal adenoma. No acute findings. Musculoskeletal: No chest wall abnormality. No acute or  significant osseous findings. Review of the MIP images confirms the above findings. IMPRESSION: 1. Negative for acute PE or thoracic aortic dissection. 2. Stable 4.4 cm descending thoracic aortic aneurysm. Recommend semi-annual imaging followup by CTA or MRA and referral to cardiothoracic surgery if not already obtained. This recommendation follows 2010 ACCF/AHA/AATS/ACR/ASA/SCA/SCAI/SIR/STS/SVM Guidelines for the Diagnosis and Management of Patients With Thoracic Aortic Disease. Circulation. 2010; 121: e266-e36 3. Stable 1.8 cm right upper quadrant retroperitoneal nodule. Electronically Signed   By: Lucrezia Europe M.D.   On: 11/09/2017 14:56    ASSESSMENT & PLAN:   72 y.o. female with  1. Stage IA Hodgkin's Lymphoma (mixed cellularity) No constitutional symptoms Sed rate elevated to 30 PET findings most consistent with Stage I disease; discussed that a small hypermetabolic nodule found in her liver has remain unchanged for 4 years and is not considered to be involved with her Hodgkin's lymphoma.  -Left cervical lymph nodes are involved without further involvement.  Component     Latest Ref Rng & Units 08/29/2017  Retic Ct Pct     0.7 - 2.1 % 0.8  RBC.     3.70 - 5.45 MIL/uL 4.60  Retic Count, Absolute     33.7 - 90.7 K/uL 36.8  Sed Rate     0 - 22 mm/hr 30 (H)  LDH     125 - 245 U/L 179  HCV Ab     0.0 - 0.9 s/co ratio <0.1  Hepatitis B Surface Ag     Negative Negative  Hep B Core Ab, Tot     Negative Negative    PLAN -labs done today stable. Discussed with patient -she is ok to proceed with C2D1 of chemotherapy as ordered Prefers to go with granix as opposed to neulasta given significant bone pains. -Pt will immediately let us know if she develops any fevers or concerns of infection -Pt is okay to take tylenol for bone pains and she will let us know if she gets too uncomfortable after growth factors.  -Pt will avoid crowds and individuals with infections   -Recommended that while  she receives chemotherapy, to eat popsicles and ice chips to reduce mouth sores, and using salt and baking soda rinse.  -Recommend that the pt walk 20-30 minutes each day and continue to eat and hydrate well.  -Previously discussed 3 cycles of AVD followed by either observation or involved-site radiation.  -She is fine to continue ativan as needed for her occasional vertigo.   -Changed Neulasta to granix. Please schedule granix for 4 days after each chemotherapy day as ordered -RTC with Dr Irene Limbo in 2 weeks with labs   All of the patients questions were answered with apparent satisfaction. The patient knows to call the clinic with any problems, questions or concerns.  . The total time spent in the appointment was 20 minutes and more than 50% was on counseling and direct patient cares.    Sullivan Lone MD [place] AAHIVMS [facility] Hematology/Oncology Physician [facility]  (Office):       [phone] (Work cell):  [phone] (Fax):  [phone]  11/18/2017 2:00 PM  This document serves as a record of services personally performed by Sullivan Lone, MD. It was created on his behalf by Margit Banda, a trained medical scribe. The creation of this record is based on the scribe's personal observations and the provider's statements to them.   .I have reviewed the above documentation for accuracy and completeness, and I agree with the above. Brunetta Genera MD MS

## 2017-11-18 NOTE — Patient Instructions (Signed)
[facility] Discharge Instructions for Patients Receiving Chemotherapy  Today you received the following chemotherapy agents: Adriamycin, Vinblastine, and Dacarbazine.  To help prevent nausea and vomiting after your treatment, we encourage you to take your nausea medication as directed.   If you develop nausea and vomiting that is not controlled by your nausea medication, call the clinic.   BELOW ARE SYMPTOMS THAT SHOULD BE REPORTED IMMEDIATELY:  *FEVER GREATER THAN 100.5 F  *CHILLS WITH OR WITHOUT FEVER  NAUSEA AND VOMITING THAT IS NOT CONTROLLED WITH YOUR NAUSEA MEDICATION  *UNUSUAL SHORTNESS OF BREATH  *UNUSUAL BRUISING OR BLEEDING  TENDERNESS IN MOUTH AND THROAT WITH OR WITHOUT PRESENCE OF ULCERS  *URINARY PROBLEMS  *BOWEL PROBLEMS  UNUSUAL RASH Items with * indicate a potential emergency and should be followed up as soon as possible.  Feel free to call the clinic should you have any questions or concerns. The clinic phone number is [phone].  Please show the CHEMO ALERT CARD at check-in to the Emergency Department and triage nurse.   

## 2017-11-18 NOTE — Telephone Encounter (Signed)
Pt called requesting potential order of additional antibiotic. Z pack completed and pt still experiencing cough and runny nose with clear drainage. Pt denies fever/chills, HA, or chest discomfort. Discussed symptoms with Dr. Irene Limbo who would like to see the pt today. Called pt back and LVM that pt needs to come in for appointments today and that MD would additionally like to f/u with the pt.

## 2017-11-19 ENCOUNTER — Inpatient Hospital Stay: Payer: Medicare HMO

## 2017-11-19 DIAGNOSIS — C8121 Mixed cellularity classical Hodgkin lymphoma, lymph nodes of head, face, and neck: Secondary | ICD-10-CM

## 2017-11-19 DIAGNOSIS — Z7189 Other specified counseling: Secondary | ICD-10-CM

## 2017-11-19 DIAGNOSIS — Z5111 Encounter for antineoplastic chemotherapy: Secondary | ICD-10-CM | POA: Diagnosis not present

## 2017-11-19 MED ORDER — TBO-FILGRASTIM 480 MCG/0.8ML ~~LOC~~ SOSY
PREFILLED_SYRINGE | SUBCUTANEOUS | Status: AC
  Filled 2017-11-19: qty 0.8

## 2017-11-19 MED ORDER — TBO-FILGRASTIM 480 MCG/0.8ML ~~LOC~~ SOSY
480.0000 ug | PREFILLED_SYRINGE | Freq: Once | SUBCUTANEOUS | Status: AC
  Administered 2017-11-19: 480 ug via SUBCUTANEOUS

## 2017-11-20 ENCOUNTER — Inpatient Hospital Stay: Payer: Medicare HMO

## 2017-11-20 VITALS — BP 126/76 | HR 97 | Temp 98.0°F | Resp 18

## 2017-11-20 DIAGNOSIS — Z7189 Other specified counseling: Secondary | ICD-10-CM

## 2017-11-20 DIAGNOSIS — C8121 Mixed cellularity classical Hodgkin lymphoma, lymph nodes of head, face, and neck: Secondary | ICD-10-CM

## 2017-11-20 DIAGNOSIS — Z5111 Encounter for antineoplastic chemotherapy: Secondary | ICD-10-CM | POA: Diagnosis not present

## 2017-11-20 MED ORDER — TBO-FILGRASTIM 480 MCG/0.8ML ~~LOC~~ SOSY
PREFILLED_SYRINGE | SUBCUTANEOUS | Status: AC
  Filled 2017-11-20: qty 0.8

## 2017-11-20 MED ORDER — TBO-FILGRASTIM 480 MCG/0.8ML ~~LOC~~ SOSY
480.0000 ug | PREFILLED_SYRINGE | Freq: Once | SUBCUTANEOUS | Status: AC
  Administered 2017-11-20: 480 ug via SUBCUTANEOUS

## 2017-11-20 NOTE — Patient Instructions (Signed)
Tbo-Filgrastim injection What is this medicine? TBO-FILGRASTIM (T B O fil GRA stim) is a granulocyte colony-stimulating factor that stimulates the growth of neutrophils, a type of white blood cell important in the body's fight against infection. It is used to reduce the incidence of fever and infection in patients with certain types of cancer who are receiving chemotherapy that affects the bone marrow. This medicine may be used for other purposes; ask your health care provider or pharmacist if you have questions. COMMON BRAND NAME(S): Granix What should I tell my health care provider before I take this medicine? They need to know if you have any of these conditions: -bone scan or tests planned -kidney disease -sickle cell anemia -an unusual or allergic reaction to tbo-filgrastim, filgrastim, pegfilgrastim, other medicines, foods, dyes, or preservatives -pregnant or trying to get pregnant -breast-feeding How should I use this medicine? This medicine is for injection under the skin. If you get this medicine at home, you will be taught how to prepare and give this medicine. Refer to the Instructions for Use that come with your medication packaging. Use exactly as directed. Take your medicine at regular intervals. Do not take your medicine more often than directed. It is important that you put your used needles and syringes in a special sharps container. Do not put them in a trash can. If you do not have a sharps container, call your pharmacist or healthcare provider to get one. Talk to your pediatrician regarding the use of this medicine in children. Special care may be needed. Overdosage: If you think you have taken too much of this medicine contact a poison control center or emergency room at once. NOTE: This medicine is only for you. Do not share this medicine with others. What if I miss a dose? It is important not to miss your dose. Call your doctor or health care professional if you miss a  dose. What may interact with this medicine? This medicine may interact with the following medications: -medicines that may cause a release of neutrophils, such as lithium This list may not describe all possible interactions. Give your health care provider a list of all the medicines, herbs, non-prescription drugs, or dietary supplements you use. Also tell them if you smoke, drink alcohol, or use illegal drugs. Some items may interact with your medicine. What should I watch for while using this medicine? You may need blood work done while you are taking this medicine. What side effects may I notice from receiving this medicine? Side effects that you should report to your doctor or health care professional as soon as possible: -allergic reactions like skin rash, itching or hives, swelling of the face, lips, or tongue -blood in the urine -dark urine -dizziness -fast heartbeat -feeling faint -shortness of breath or breathing problems -signs and symptoms of infection like fever or chills; cough; or sore throat -signs and symptoms of kidney injury like trouble passing urine or change in the amount of urine -stomach or side pain, or pain at the shoulder -sweating -swelling of the legs, ankles, or abdomen -tiredness Side effects that usually do not require medical attention (report to your doctor or health care professional if they continue or are bothersome): -bone pain -headache -muscle pain -vomiting This list may not describe all possible side effects. Call your doctor for medical advice about side effects. You may report side effects to FDA at 1-800-FDA-1088. Where should I keep my medicine? Keep out of the reach of children. Store in a refrigerator between   2 and 8 degrees C (36 and 46 degrees F). Keep in carton to protect from light. Throw away this medicine if it is left out of the refrigerator for more than 5 consecutive days. Throw away any unused medicine after the expiration  date. NOTE: This sheet is a summary. It may not cover all possible information. If you have questions about this medicine, talk to your doctor, pharmacist, or health care provider.  2018 Elsevier/Gold Standard (2015-07-31 19:07:04)  

## 2017-11-21 ENCOUNTER — Inpatient Hospital Stay: Payer: Medicare HMO

## 2017-11-21 ENCOUNTER — Telehealth: Payer: Self-pay

## 2017-11-21 VITALS — BP 128/73 | HR 85 | Temp 98.0°F | Resp 20

## 2017-11-21 DIAGNOSIS — Z7189 Other specified counseling: Secondary | ICD-10-CM

## 2017-11-21 DIAGNOSIS — C8121 Mixed cellularity classical Hodgkin lymphoma, lymph nodes of head, face, and neck: Secondary | ICD-10-CM

## 2017-11-21 DIAGNOSIS — Z5111 Encounter for antineoplastic chemotherapy: Secondary | ICD-10-CM | POA: Diagnosis not present

## 2017-11-21 MED ORDER — TBO-FILGRASTIM 480 MCG/0.8ML ~~LOC~~ SOSY
480.0000 ug | PREFILLED_SYRINGE | Freq: Once | SUBCUTANEOUS | Status: AC
  Administered 2017-11-21: 480 ug via SUBCUTANEOUS

## 2017-11-21 NOTE — Telephone Encounter (Signed)
Spoke with patient concerning upcoming injections added to her current schedule. Per 5/30 los

## 2017-11-21 NOTE — Patient Instructions (Signed)
Tbo-Filgrastim injection What is this medicine? TBO-FILGRASTIM (T B O fil GRA stim) is a granulocyte colony-stimulating factor that stimulates the growth of neutrophils, a type of white blood cell important in the body's fight against infection. It is used to reduce the incidence of fever and infection in patients with certain types of cancer who are receiving chemotherapy that affects the bone marrow. This medicine may be used for other purposes; ask your health care provider or pharmacist if you have questions. COMMON BRAND NAME(S): Granix What should I tell my health care provider before I take this medicine? They need to know if you have any of these conditions: -bone scan or tests planned -kidney disease -sickle cell anemia -an unusual or allergic reaction to tbo-filgrastim, filgrastim, pegfilgrastim, other medicines, foods, dyes, or preservatives -pregnant or trying to get pregnant -breast-feeding How should I use this medicine? This medicine is for injection under the skin. If you get this medicine at home, you will be taught how to prepare and give this medicine. Refer to the Instructions for Use that come with your medication packaging. Use exactly as directed. Take your medicine at regular intervals. Do not take your medicine more often than directed. It is important that you put your used needles and syringes in a special sharps container. Do not put them in a trash can. If you do not have a sharps container, call your pharmacist or healthcare provider to get one. Talk to your pediatrician regarding the use of this medicine in children. Special care may be needed. Overdosage: If you think you have taken too much of this medicine contact a poison control center or emergency room at once. NOTE: This medicine is only for you. Do not share this medicine with others. What if I miss a dose? It is important not to miss your dose. Call your doctor or health care professional if you miss a  dose. What may interact with this medicine? This medicine may interact with the following medications: -medicines that may cause a release of neutrophils, such as lithium This list may not describe all possible interactions. Give your health care provider a list of all the medicines, herbs, non-prescription drugs, or dietary supplements you use. Also tell them if you smoke, drink alcohol, or use illegal drugs. Some items may interact with your medicine. What should I watch for while using this medicine? You may need blood work done while you are taking this medicine. What side effects may I notice from receiving this medicine? Side effects that you should report to your doctor or health care professional as soon as possible: -allergic reactions like skin rash, itching or hives, swelling of the face, lips, or tongue -blood in the urine -dark urine -dizziness -fast heartbeat -feeling faint -shortness of breath or breathing problems -signs and symptoms of infection like fever or chills; cough; or sore throat -signs and symptoms of kidney injury like trouble passing urine or change in the amount of urine -stomach or side pain, or pain at the shoulder -sweating -swelling of the legs, ankles, or abdomen -tiredness Side effects that usually do not require medical attention (report to your doctor or health care professional if they continue or are bothersome): -bone pain -headache -muscle pain -vomiting This list may not describe all possible side effects. Call your doctor for medical advice about side effects. You may report side effects to FDA at 1-800-FDA-1088. Where should I keep my medicine? Keep out of the reach of children. Store in a refrigerator between   2 and 8 degrees C (36 and 46 degrees F). Keep in carton to protect from light. Throw away this medicine if it is left out of the refrigerator for more than 5 consecutive days. Throw away any unused medicine after the expiration  date. NOTE: This sheet is a summary. It may not cover all possible information. If you have questions about this medicine, talk to your doctor, pharmacist, or health care provider.  2018 Elsevier/Gold Standard (2015-07-31 19:07:04)  

## 2017-11-22 ENCOUNTER — Inpatient Hospital Stay: Payer: Medicare HMO | Attending: Hematology

## 2017-11-22 VITALS — BP 143/72 | HR 71 | Temp 97.9°F | Resp 18

## 2017-11-22 DIAGNOSIS — Z5189 Encounter for other specified aftercare: Secondary | ICD-10-CM | POA: Diagnosis not present

## 2017-11-22 DIAGNOSIS — C8121 Mixed cellularity classical Hodgkin lymphoma, lymph nodes of head, face, and neck: Secondary | ICD-10-CM | POA: Insufficient documentation

## 2017-11-22 DIAGNOSIS — Z5111 Encounter for antineoplastic chemotherapy: Secondary | ICD-10-CM | POA: Diagnosis present

## 2017-11-22 DIAGNOSIS — D702 Other drug-induced agranulocytosis: Secondary | ICD-10-CM

## 2017-11-22 MED ORDER — TBO-FILGRASTIM 480 MCG/0.8ML ~~LOC~~ SOSY
PREFILLED_SYRINGE | SUBCUTANEOUS | Status: AC
  Filled 2017-11-22: qty 0.8

## 2017-11-22 MED ORDER — TBO-FILGRASTIM 480 MCG/0.8ML ~~LOC~~ SOSY
480.0000 ug | PREFILLED_SYRINGE | Freq: Once | SUBCUTANEOUS | Status: AC
  Administered 2017-11-22: 480 ug via SUBCUTANEOUS

## 2017-11-22 NOTE — Patient Instructions (Signed)
Tbo-Filgrastim injection What is this medicine? TBO-FILGRASTIM (T B O fil GRA stim) is a granulocyte colony-stimulating factor that stimulates the growth of neutrophils, a type of white blood cell important in the body's fight against infection. It is used to reduce the incidence of fever and infection in patients with certain types of cancer who are receiving chemotherapy that affects the bone marrow. This medicine may be used for other purposes; ask your health care provider or pharmacist if you have questions. COMMON BRAND NAME(S): Granix What should I tell my health care provider before I take this medicine? They need to know if you have any of these conditions: -bone scan or tests planned -kidney disease -sickle cell anemia -an unusual or allergic reaction to tbo-filgrastim, filgrastim, pegfilgrastim, other medicines, foods, dyes, or preservatives -pregnant or trying to get pregnant -breast-feeding How should I use this medicine? This medicine is for injection under the skin. If you get this medicine at home, you will be taught how to prepare and give this medicine. Refer to the Instructions for Use that come with your medication packaging. Use exactly as directed. Take your medicine at regular intervals. Do not take your medicine more often than directed. It is important that you put your used needles and syringes in a special sharps container. Do not put them in a trash can. If you do not have a sharps container, call your pharmacist or healthcare provider to get one. Talk to your pediatrician regarding the use of this medicine in children. Special care may be needed. Overdosage: If you think you have taken too much of this medicine contact a poison control center or emergency room at once. NOTE: This medicine is only for you. Do not share this medicine with others. What if I miss a dose? It is important not to miss your dose. Call your doctor or health care professional if you miss a  dose. What may interact with this medicine? This medicine may interact with the following medications: -medicines that may cause a release of neutrophils, such as lithium This list may not describe all possible interactions. Give your health care provider a list of all the medicines, herbs, non-prescription drugs, or dietary supplements you use. Also tell them if you smoke, drink alcohol, or use illegal drugs. Some items may interact with your medicine. What should I watch for while using this medicine? You may need blood work done while you are taking this medicine. What side effects may I notice from receiving this medicine? Side effects that you should report to your doctor or health care professional as soon as possible: -allergic reactions like skin rash, itching or hives, swelling of the face, lips, or tongue -blood in the urine -dark urine -dizziness -fast heartbeat -feeling faint -shortness of breath or breathing problems -signs and symptoms of infection like fever or chills; cough; or sore throat -signs and symptoms of kidney injury like trouble passing urine or change in the amount of urine -stomach or side pain, or pain at the shoulder -sweating -swelling of the legs, ankles, or abdomen -tiredness Side effects that usually do not require medical attention (report to your doctor or health care professional if they continue or are bothersome): -bone pain -headache -muscle pain -vomiting This list may not describe all possible side effects. Call your doctor for medical advice about side effects. You may report side effects to FDA at 1-800-FDA-1088. Where should I keep my medicine? Keep out of the reach of children. Store in a refrigerator between   2 and 8 degrees C (36 and 46 degrees F). Keep in carton to protect from light. Throw away this medicine if it is left out of the refrigerator for more than 5 consecutive days. Throw away any unused medicine after the expiration  date. NOTE: This sheet is a summary. It may not cover all possible information. If you have questions about this medicine, talk to your doctor, pharmacist, or health care provider.  2018 Elsevier/Gold Standard (2015-07-31 19:07:04)  

## 2017-11-24 ENCOUNTER — Telehealth: Payer: Self-pay

## 2017-11-24 ENCOUNTER — Inpatient Hospital Stay: Payer: Medicare HMO | Admitting: Nutrition

## 2017-11-24 ENCOUNTER — Other Ambulatory Visit: Payer: Medicare HMO

## 2017-11-24 ENCOUNTER — Ambulatory Visit: Payer: Medicare HMO

## 2017-11-24 NOTE — Telephone Encounter (Signed)
Patient called to cancel appointment with [facility] due to not feeling well. Per 6/3 phone que. Also called and left a msg for [facility] with patient information and reason for canceling her appointment

## 2017-11-25 ENCOUNTER — Telehealth: Payer: Self-pay

## 2017-11-25 NOTE — Telephone Encounter (Signed)
Nutrition Assessment   Reason for Assessment:   Patient identified on Malnutrition screening tool for weight loss and poor appetite.    ASSESSMENT:  72 year old female with hodgkins lymphoma.  Past medical history of Vit D deficency, osteopenia, obesity. Patient receiving chemotherapy and granix.    Called patient this am and introduce self and service.  Reports that her appetite is decrease especially over the last 3-4 days.  Reports that she is so dizzy and has placed a call to Dr. Grier Mitts nurse this am regarding her dizziness.  Patient reports last night had an episode of nausea but took medication and it resolved.  Patient has not tried ensure/boost shakes.  Nutrition Focused Physical Exam: deferred  Medications: decadron, ativan, miralax, compazine, phenergan, MVI  Labs: albumin 3.4  Anthropometrics:   Height: 68 inches Weight: 194 lb 6.4 oz on 5/28 UBW: 210 lb in April 2019 BMI: 29  7% weight loss in the last 2 months, significant  Estimated Energy Needs  Kcals: 1850-2200 calories/d Protein: 96-128 g/d Fluid: 2.2 L/d  NUTRITION DIAGNOSIS: Inadequate oral intake related to cancer related treatment side effects as evidenced by 7% weight loss in 2 months.   MALNUTRITION DIAGNOSIS: continue to monitor, likely with weight loss   INTERVENTION:   Discussed importance of good nutrition.  Patient agreeable to having fact sheet mailed regarding ways to increase calories and protein.   Encouraged patient to try ensure/boost shakes.  Gave tips for ways to change flavor of shakes.  Will mail coupons.  Contact information will be mailed and encouraged patient to reach out to nutrition for appointment, questions, concerns.     MONITORING, EVALUATION, GOAL: patient will consume adequate calories and protein to meet nutritional needs   NEXT VISIT: as needed  Morayo Leven B. Zenia Resides, [place], [place] Registered Dietitian [phone] (pager)

## 2017-11-26 ENCOUNTER — Other Ambulatory Visit: Payer: Self-pay

## 2017-11-26 ENCOUNTER — Telehealth: Payer: Self-pay

## 2017-11-26 DIAGNOSIS — C8121 Mixed cellularity classical Hodgkin lymphoma, lymph nodes of head, face, and neck: Secondary | ICD-10-CM

## 2017-11-26 NOTE — Telephone Encounter (Signed)
Pt called this morning in regards to dizziness. Pt states that she is eating and drinking normally, but has been in the bed for the past 2 days. Symptoms worsen when pt stands from sitting or lying position. Pt has hx of vertigo, but this started after injection in [place]. Pt can take ativan to help with symptoms. Additionally, Dr. Irene Limbo recommends increasing water and salt intake. Meclizine discussed as potential treatment, but will not help with symptom if dehydration is the cause. Asked pt if she would be willing to come in for lab work and potential hydration/electrolye replacement. Pt does not have transportation today. Appt created for lab and symptom management tomorrow. Called pt to confirm, but she does not have confirmed transportation tomorrow either. Pt plans to call back in the morning to notify if symptoms have worsened.   Spoke with [facility] RN, and suggestion to leave appt as scheduled in the event that the pt status changes and decides to come in morning.

## 2017-11-27 ENCOUNTER — Other Ambulatory Visit: Payer: Medicare HMO

## 2017-11-27 ENCOUNTER — Encounter: Payer: Medicare HMO | Admitting: Medical

## 2017-12-01 NOTE — Progress Notes (Signed)
HEMATOLOGY/ONCOLOGY CLINIC NOTE  Date of Service: 12/02/17  Patient Care Team: Kathyrn Lass, MD as PCP - General (Family Medicine)  CHIEF COMPLAINTS/PURPOSE OF CONSULTATION:  F/u for continued mx of  Hodgkin's Lymphoma   HISTORY OF PRESENTING ILLNESS:   Jennifer Juarez is a wonderful 72 y.o. female who has been referred to [place] by Dr. Lattie Haw Miller/Courtney Rolland Porter PA_C  for evaluation and management of likely newly diagnosed Hodgkin's Lymphoma.   The pt reports that she is doing very well overall and notes that she is fully functional and does not have any significant chronic medical problems or any functional limitations.  She notes that on 06/26/17 she noticed a knot on the left side of her neck that subsequently precipitated her CT scan and Bx prior to seeing [place] today.   On 07/28/17 the pt had a CT Soft Tissue Neck revealing Enlarged lymph nodes in the left neck compatible with neoplasm.Enlarged lymph nodes in the left neck. Large left level 2 lymph node 23 x 30 mm. Multiple posterior lymph nodes are present measuring up to 10 mm. No enlarged lymph nodes in the right neck. Biopsy recommended. Attention left tonsil on direct mucosal inspection. Possible tonsillar carcinoma on the left. Lymphoma also in the differential.    Of note prior to the patient's visit, pt has had a Lymph node Needle/Core biopsy completed on 08/12/17 with results revealing ATYPICAL LYMPHOID PROLIFERATION. Immunohistochemical stains were performed including LCA, CD20, PAX-5, CD3, CD15, and CD30 with appropriate controls. The large atypical lymphoid-appearing cells are positive for CD30, CD15 and PAX-5 and negative for CD3, CD20 and LCA. The lymphocytic population in the background show a mixture of T and B-cells with predominance of T-cells. The overall findings are limited but atypical and worrisome for a lymphoproliferative process, particularly. classical Hodgkin lymphoma. Excisional biopsy is strongly  recommended.  Also on 08/12/17 the pt had a Tissue Flow Cytometry revealing NO MONOCLONAL B-CELL POPULATION OR ABNORMAL T-CELL PHENOTYPE IDENTIFIED.   Most recent CBC with Diff. results (07/15/17) revealed all values WNL.   She notes that she has had a viral infection in 2005 that resulted in a complete loss of hearing in her right ear. She notes that her left ear has had fluid build up and has resulted in significant loss of hearing, for which she uses hearing aids.  On review of systems, pt reports good energy levels, and denies fevers, chills, night sweats, unexpected weight loss, skin itching, rashes, soreness, sore throat, difficulties swallowing, back pains, abdominal pains, leg swelling, and any other symptoms.   On PMHx the pt reports arthritis, vertigo, polyps. She denies heart, lung, kidney or liver problems. She denies DM.  On Social Hx the pt notes having quit smoking when she was 63. She reports infrequent EOH consumption. She denies chemical or radiation exposure.  On Family Hx she reports that her mother had liver cancer. Her sister had breast cancer at 62 y/o and pancreatic cancer at 74 y/o.  Interval History:   Jennifer Juarez is here for a scheduled follow-up of her classical Hodgkin's Lymphoma. Today is C2D15. The patient's last visit with [place] was on 11/18/17. The pt reports that she is doing well overall.   The pt reports that she felt dizzy and a lack of appetite after receiving her last Granix shot for about 3-4 days. She denies a spinning feeling to her dizziness. She notes that she has been eating about half as much as she did prior to treatment, but has  begun drinking Boost. She notes that she drinks 64 oz of liquid occasionally and is trying to reach this goal. She also notes that her heart rate has been higher intermittently.   The pt notes that she really wants to take only  3 shots of Granix each cycle.  Lab results today (12/02/17) of CBC, CMP is as follows: all values are WNL  except for RBC at 3.59, HGB at 9.8, HCT at 31.5, MCHC at 31.1, RDW at 17.1, Albumin at 3.4, Total Bilirubin <0.2. Magnesium 12/02/17 is WNL at 2.1 Phosphorous 12/02/17 is 3.9  On review of systems, pt reports intermittent tachycardia, occasional dizziness, decreased appetite, normal and regular bowel movements, and denies mouth sores, noticing any new lumps or bumps, abdominal pains, leg swelling, and any other symptoms.    MEDICAL HISTORY:  Past Medical History:  Diagnosis Date  . BPPV (benign paroxysmal positional vertigo)   . Cancer ([place])   . H/O seasonal allergies   . Hearing loss in right ear   . Left cervical lymphadenopathy   . Macular degeneration   . Obesity   . Osteopenia   . Pre-syncope 11/09/2017  . Retinal vein occlusion   . Vitamin D deficiency     SURGICAL HISTORY: Past Surgical History:  Procedure Laterality Date  . CHOLECYSTECTOMY    . IR FLUORO GUIDE PORT INSERTION RIGHT  10/10/2017  . IR US GUIDE VASC ACCESS RIGHT  10/10/2017  . MASS EXCISION Left 09/22/2017   Procedure: LEFT CERVICAL LYMPH NODE EXCISION;  Surgeon: Izora Gala, MD;  Location: [facility];  Service: ENT;  Laterality: Left;  . TUBAL LIGATION      SOCIAL HISTORY: Social History   Socioeconomic History  . Marital status: Single    Spouse name: Not on file  . Number of children: Not on file  . Years of education: Not on file  . Highest education level: Not on file  Occupational History  . Not on file  Social Needs  . Financial resource strain: Not on file  . Food insecurity:    Worry: Not on file    Inability: Not on file  . Transportation needs:    Medical: Not on file    Non-medical: Not on file  Tobacco Use  . Smoking status: Former Smoker    Last attempt to quit: 06/24/1988    Years since quitting: 29.4  . Smokeless tobacco: Never Used  Substance and Sexual Activity  . Alcohol use: No  . Drug use: No  . Sexual activity: Not on file  Lifestyle  . Physical activity:     Days per week: Not on file    Minutes per session: Not on file  . Stress: Not on file  Relationships  . Social connections:    Talks on phone: Not on file    Gets together: Not on file    Attends religious service: Not on file    Active member of club or organization: Not on file    Attends meetings of clubs or organizations: Not on file    Relationship status: Not on file  . Intimate partner violence:    Fear of current or ex partner: Not on file    Emotionally abused: Not on file    Physically abused: Not on file    Forced sexual activity: Not on file  Other Topics Concern  . Not on file  Social History Narrative  . Not on file    FAMILY HISTORY: Family History  Problem Relation Age of Onset  . Cancer Mother        liver  . Heart attack Father   . Hypertension Father   . Diabetes Mellitus I Father   . Cancer Sister        pancreatic, breast  . Diabetes Mellitus I Sister   . Heart disease Brother   . Heart disease Brother   . Heart disease Brother   . Diabetes Mellitus I Sister   . Diabetes Mellitus I Sister     ALLERGIES:  is allergic to cephalexin; doxycycline; levofloxacin; prilosec [omeprazole]; and singulair [montelukast].  MEDICATIONS:  Current Outpatient Medications  Medication Sig Dispense Refill  . acetaminophen (TYLENOL) 500 MG tablet Take 500 mg by mouth every 8 (eight) hours as needed for mild pain or moderate pain.     Marland Kitchen azithromycin (ZITHROMAX Z-PAK) 250 MG tablet Take 2 tablets by mouth on day one, then take 1 tablet daily for 4 days. 6 each 0  . dexamethasone (DECADRON) 4 MG tablet Take 2 tablets by mouth once a day on the day after chemotherapy and then take 2 tablets two times a day for 2 days. Take with food. 30 tablet 1  . lidocaine-prilocaine (EMLA) cream Apply to affected area once (Patient taking differently: Apply 1 application topically daily as needed. Port) 30 g 3  . LORazepam (ATIVAN) 0.5 MG tablet Take 1 tablet (0.5 mg total) by mouth  every 8 (eight) hours as needed for anxiety (nausea). 30 tablet 0  . ondansetron (ZOFRAN) 8 MG tablet Take 1 tablet (8 mg total) by mouth 2 (two) times daily as needed. Start on the third day after chemotherapy. 30 tablet 1  . polyethylene glycol (MIRALAX / GLYCOLAX) packet Take 17 g by mouth daily.    . prochlorperazine (COMPAZINE) 10 MG tablet Take 1 tablet (10 mg total) by mouth every 6 (six) hours as needed (Nausea or vomiting). 30 tablet 1  . promethazine (PHENERGAN) 25 MG suppository Place 1 suppository (25 mg total) rectally every 6 (six) hours as needed for nausea or vomiting. 12 suppository 1  . triamcinolone (NASACORT AQ) 55 MCG/ACT AERO nasal inhaler Place 2 sprays into the nose daily as needed (allergies).      No current facility-administered medications for this visit.     REVIEW OF SYSTEMS:    A 10+ POINT REVIEW OF SYSTEMS WAS OBTAINED including neurology, dermatology, psychiatry, cardiac, respiratory, lymph, extremities, GI, GU, Musculoskeletal, constitutional, breasts, reproductive, HEENT.  All pertinent positives are noted in the HPI.  All others are negative.   PHYSICAL EXAMINATION: ECOG PERFORMANCE STATUS: 1 BP 126/75 (BP Location: Left Arm, Patient Position: Sitting)   Pulse 96   Temp 98.6 F (37 C) (Oral)   Resp 18   Ht 5\' 8"  (1.727 m)   Wt 191 lb 9.6 oz (86.9 kg)   SpO2 99%   BMI 29.13 kg/m   GENERAL:alert, in no acute distress and comfortable SKIN: no acute rashes, no significant lesions EYES: conjunctiva are pink and non-injected, sclera anicteric OROPHARYNX: MMM, no exudates, no oropharyngeal erythema or ulceration NECK: supple, no JVD LYMPH:  no palpable lymphadenopathy in the cervical, axillary or inguinal regions LUNGS: clear to auscultation b/l with normal respiratory effort HEART: regular rate & rhythm ABDOMEN:  normoactive bowel sounds , non tender, not distended. Extremity: no pedal edema PSYCH: alert & oriented x 3 with fluent speech NEURO: no  focal motor/sensory deficits   LABORATORY DATA:  I have reviewed the data as listed  .  CBC Latest Ref Rng & Units 12/02/2017 11/18/2017 11/10/2017  WBC 3.9 - 10.3 K/uL 5.7 16.4(H) 4.3  Hemoglobin 11.6 - 15.9 g/dL 9.8(L) 10.3(L) 9.1(L)  Hematocrit 34.8 - 46.6 % 31.5(L) 31.6(L) 28.5(L)  Platelets 145 - 400 K/uL 386 349 217  ANC 8.3  . CMP Latest Ref Rng & Units 12/02/2017 11/18/2017 11/10/2017  Glucose 70 - 140 mg/dL 100 95 88  BUN 7 - 26 mg/dL 8 8 13   Creatinine 0.60 - 1.10 mg/dL 0.71 0.72 0.61  Sodium 136 - 145 mmol/L 138 136 137  Potassium 3.5 - 5.1 mmol/L 4.1 3.8 3.7  Chloride 98 - 109 mmol/L 106 105 107  CO2 22 - 29 mmol/L 23 22 22   Calcium 8.4 - 10.4 mg/dL 9.7 9.4 8.4(L)  Total Protein 6.4 - 8.3 g/dL 6.7 6.8 -  Total Bilirubin 0.2 - 1.2 mg/dL <0.2(L) 0.4 -  Alkaline Phos 40 - 150 U/L 83 89 -  AST 5 - 34 U/L 15 15 -  ALT 0 - 55 U/L 15 14 -     Component     Latest Ref Rng & Units 08/29/2017  Retic Ct Pct     0.7 - 2.1 % 0.8  RBC.     3.70 - 5.45 MIL/uL 4.60  Retic Count, Absolute     33.7 - 90.7 K/uL 36.8  Sed Rate     0 - 22 mm/hr 30 (H)  LDH     125 - 245 U/L 179  HCV Ab     0.0 - 0.9 s/co ratio <0.1  Hepatitis B Surface Ag     Negative Negative  Hep B Core Ab, Tot     Negative Negative     08/12/17 Lymph Node Needle/core Biopsy:   08/12/17 Tissue Flow Cytometry:    09/22/17 Tissue Flow Cytometry    09/22/17 Lymph Node Pathology:   RADIOGRAPHIC STUDIES: I have personally reviewed the radiological images as listed and agreed with the findings in the report. Dg Chest 2 View  Result Date: 11/09/2017 CLINICAL DATA:  Dry cough for the past 4 days. EXAM: CHEST - 2 VIEW COMPARISON:  Chest x-ray dated March 02, 2015. FINDINGS: Right chest wall port catheter with the tip in the proximal SVC. The heart size and mediastinal contours are within normal limits. Tortuous thoracic aorta. Normal pulmonary vascularity. No focal consolidation, pleural effusion, or  pneumothorax. No acute osseous abnormality. IMPRESSION: No active cardiopulmonary disease. Electronically Signed   By: Titus Dubin M.D.   On: 11/09/2017 10:10   Ct Angio Chest Pe W And/or Wo Contrast  Result Date: 11/09/2017 CLINICAL DATA:  Cough, congestion x4 days.  Hodgkin's lymphoma. EXAM: CT ANGIOGRAPHY CHEST WITH CONTRAST TECHNIQUE: Multidetector CT imaging of the chest was performed using the standard protocol during bolus administration of intravenous contrast. Multiplanar CT image reconstructions and MIPs were obtained to evaluate the vascular anatomy. CONTRAST:  41mL ISOVUE-370 IOPAMIDOL (ISOVUE-370) INJECTION 76% COMPARISON:  PET-CT 09/17/2017 and previous FINDINGS: Cardiovascular: Heart size normal. Trace pericardial fluid. Right IJ port catheter extends to the lower SVC. Right ventricle nondilated. Satisfactory opacification of pulmonary arteries noted, and there is no evidence of pulmonary emboli. Adequate contrast opacification of the thoracic aorta. No evidence of dissection or stenosis. The ascending segment and arch are normal in caliber. There is classic 3-vessel brachiocephalic arch anatomy without proximal stenosis. Scattered calcified plaque in the arch and descending thoracic segment. There is fusiform dilatation of the descending thoracic aorta measuring 3.6 cm proximally, 4.4 cm maximum  transverse diameter in its mid segment, 4.3 cm diameter just above and at the diaphragm. There is some eccentric nonocclusive mural thrombus in the distal descending segment. The proximal abdominal aorta measures 3.5 cm diameter just below the origin of the celiac axis, incompletely visualized distally. Mediastinum/Nodes: No hilar or mediastinal adenopathy. Lungs/Pleura: 0.7 cm angular probable intrapulmonary lymph node, right middle lobe image 72/10, stable since 01/25/2014. Lungs are otherwise clear. No pleural effusion or pneumothorax. Upper Abdomen: Cholecystectomy clips. Mild pancreatic  parenchymal atrophy. Stable 18.5 mm retroperitoneal nodule posterior to the liver with central coarse calcification. 1.2 cm left adrenal nodule stable since 01/25/2014 suggesting benign adrenal adenoma. No acute findings. Musculoskeletal: No chest wall abnormality. No acute or significant osseous findings. Review of the MIP images confirms the above findings. IMPRESSION: 1. Negative for acute PE or thoracic aortic dissection. 2. Stable 4.4 cm descending thoracic aortic aneurysm. Recommend semi-annual imaging followup by CTA or MRA and referral to cardiothoracic surgery if not already obtained. This recommendation follows 2010 ACCF/AHA/AATS/ACR/ASA/SCA/SCAI/SIR/STS/SVM Guidelines for the Diagnosis and Management of Patients With Thoracic Aortic Disease. Circulation. 2010; 121: e266-e36 3. Stable 1.8 cm right upper quadrant retroperitoneal nodule. Electronically Signed   By: Lucrezia Europe M.D.   On: 11/09/2017 14:56    ASSESSMENT & PLAN:   72 y.o. female with  1. Stage IA Hodgkin's Lymphoma (mixed cellularity) No constitutional symptoms Sed rate elevated to 30 PET findings most consistent with Stage I disease; discussed that a small hypermetabolic nodule found in her liver has remain unchanged for 4 years and is not considered to be involved with her Hodgkin's lymphoma.  -Left cervical lymph nodes are involved without further involvement.  Component     Latest Ref Rng & Units 08/29/2017  Retic Ct Pct     0.7 - 2.1 % 0.8  RBC.     3.70 - 5.45 MIL/uL 4.60  Retic Count, Absolute     33.7 - 90.7 K/uL 36.8  Sed Rate     0 - 22 mm/hr 30 (H)  LDH     125 - 245 U/L 179  HCV Ab     0.0 - 0.9 s/co ratio <0.1  Hepatitis B Surface Ag     Negative Negative  Hep B Core Ab, Tot     Negative Negative    PLAN -Discussed pt labwork today, 12/02/17; her WBC have normalized and her ANC is normal at 3.3k.  -Will order a baseline EKG today - no evidence of abnormal rhythm -Asked pt to let me know if she has  another episode of tachycardia and will order an EKG -Encouraged pt to continue optimizing nutrition and fluid intake -PET/CT before C3 -Discussed IV fluids to address dehydration indicated by her dizziness and tachycardia; pt would like to wait on this.  -Will decrease Granix shots from 4 to 3 per patients preference -The pt has no prohibitive toxicities from continuing treatment at this time.  -Pt is okay to take tylenol for bone pains and she will let us know if she gets too uncomfortable after growth factors.  -Pt will avoid crowds and individuals with infections   -Recommended that while she receives chemotherapy, to eat popsicles and ice chips to reduce mouth sores, and using salt and baking soda rinse.  -Recommend that the pt walk 20-30 minutes each day and continue to eat and hydrate well.  -Previously discussed 3 cycles of AVD followed by either observation or involved-site radiation.  -She is fine to continue ativan as needed  for her occasional vertigo.    EKG today Reducing granix to 3 doses (instead of 4 days) PET/CT in 10-12days RTC with Dr Irene Limbo in 2 weeks with next dose of ctx with labs (ok to overbook if needed).    All of the patients questions were answered with apparent satisfaction. The patient knows to call the clinic with any problems, questions or concerns.  The toal time spent in the appt was 25 minutes and more than 50% was on counseling and direct patient cares.    Sullivan Lone MD MS AAHIVMS [facility] Hematology/Oncology Physician [facility]  (Office):       [phone] (Work cell):  [phone] (Fax):           [phone]  12/02/2017 9:12 AM  I, Baldwin Jamaica, am acting as a scribe for Dr Irene Limbo.   .I have reviewed the above documentation for accuracy and completeness, and I agree with the above. Brunetta Genera MD

## 2017-12-02 ENCOUNTER — Telehealth: Payer: Self-pay | Admitting: Hematology

## 2017-12-02 ENCOUNTER — Inpatient Hospital Stay: Payer: Medicare HMO

## 2017-12-02 ENCOUNTER — Inpatient Hospital Stay: Payer: Medicare HMO | Admitting: Hematology

## 2017-12-02 ENCOUNTER — Encounter: Payer: Self-pay | Admitting: Hematology

## 2017-12-02 VITALS — BP 126/75 | HR 96 | Temp 98.6°F | Resp 18 | Ht 68.0 in | Wt 191.6 lb

## 2017-12-02 DIAGNOSIS — Z803 Family history of malignant neoplasm of breast: Secondary | ICD-10-CM

## 2017-12-02 DIAGNOSIS — R000 Tachycardia, unspecified: Secondary | ICD-10-CM

## 2017-12-02 DIAGNOSIS — Z87891 Personal history of nicotine dependence: Secondary | ICD-10-CM | POA: Diagnosis not present

## 2017-12-02 DIAGNOSIS — Z5111 Encounter for antineoplastic chemotherapy: Secondary | ICD-10-CM | POA: Diagnosis not present

## 2017-12-02 DIAGNOSIS — R700 Elevated erythrocyte sedimentation rate: Secondary | ICD-10-CM | POA: Diagnosis not present

## 2017-12-02 DIAGNOSIS — R002 Palpitations: Secondary | ICD-10-CM

## 2017-12-02 DIAGNOSIS — R42 Dizziness and giddiness: Secondary | ICD-10-CM | POA: Diagnosis not present

## 2017-12-02 DIAGNOSIS — C8121 Mixed cellularity classical Hodgkin lymphoma, lymph nodes of head, face, and neck: Secondary | ICD-10-CM

## 2017-12-02 DIAGNOSIS — Z800 Family history of malignant neoplasm of digestive organs: Secondary | ICD-10-CM

## 2017-12-02 DIAGNOSIS — Z95828 Presence of other vascular implants and grafts: Secondary | ICD-10-CM

## 2017-12-02 DIAGNOSIS — R630 Anorexia: Secondary | ICD-10-CM | POA: Diagnosis not present

## 2017-12-02 DIAGNOSIS — D702 Other drug-induced agranulocytosis: Secondary | ICD-10-CM

## 2017-12-02 DIAGNOSIS — Z7189 Other specified counseling: Secondary | ICD-10-CM

## 2017-12-02 DIAGNOSIS — E860 Dehydration: Secondary | ICD-10-CM | POA: Diagnosis not present

## 2017-12-02 LAB — CBC WITH DIFFERENTIAL/PLATELET
BASOS ABS: 0.1 10*3/uL (ref 0.0–0.1)
Basophils Relative: 1 %
Eosinophils Absolute: 0.1 10*3/uL (ref 0.0–0.5)
Eosinophils Relative: 2 %
HCT: 31.5 % — ABNORMAL LOW (ref 34.8–46.6)
HEMOGLOBIN: 9.8 g/dL — AB (ref 11.6–15.9)
LYMPHS PCT: 24 %
Lymphs Abs: 1.4 10*3/uL (ref 0.9–3.3)
MCH: 27.3 pg (ref 25.1–34.0)
MCHC: 31.1 g/dL — ABNORMAL LOW (ref 31.5–36.0)
MCV: 87.7 fL (ref 79.5–101.0)
Monocytes Absolute: 0.8 10*3/uL (ref 0.1–0.9)
Monocytes Relative: 14 %
NEUTROS ABS: 3.3 10*3/uL (ref 1.5–6.5)
NEUTROS PCT: 59 %
PLATELETS: 386 10*3/uL (ref 145–400)
RBC: 3.59 MIL/uL — ABNORMAL LOW (ref 3.70–5.45)
RDW: 17.1 % — ABNORMAL HIGH (ref 11.2–14.5)
WBC: 5.7 10*3/uL (ref 3.9–10.3)

## 2017-12-02 LAB — CMP (CANCER CENTER ONLY)
ALK PHOS: 83 U/L (ref 40–150)
ALT: 15 U/L (ref 0–55)
AST: 15 U/L (ref 5–34)
Albumin: 3.4 g/dL — ABNORMAL LOW (ref 3.5–5.0)
Anion gap: 9 (ref 3–11)
BUN: 8 mg/dL (ref 7–26)
CHLORIDE: 106 mmol/L (ref 98–109)
CO2: 23 mmol/L (ref 22–29)
CREATININE: 0.71 mg/dL (ref 0.60–1.10)
Calcium: 9.7 mg/dL (ref 8.4–10.4)
GFR, Est AFR Am: >60 mL/min (ref >60)
Glucose, Bld: 100 mg/dL (ref 70–140)
Potassium: 4.1 mmol/L (ref 3.5–5.1)
Sodium: 138 mmol/L (ref 136–145)
Total Bilirubin: <0.2 mg/dL — ABNORMAL LOW (ref 0.2–1.2)
Total Protein: 6.7 g/dL (ref 6.4–8.3)

## 2017-12-02 LAB — MAGNESIUM: MAGNESIUM: 2.1 mg/dL (ref 1.7–2.4)

## 2017-12-02 LAB — PHOSPHORUS: Phosphorus: 3.9 mg/dL (ref 2.5–4.6)

## 2017-12-02 MED ORDER — DOXORUBICIN HCL CHEMO IV INJECTION 2 MG/ML
25.0000 mg/m2 | Freq: Once | INTRAVENOUS | Status: AC
  Administered 2017-12-02: 54 mg via INTRAVENOUS
  Filled 2017-12-02: qty 27

## 2017-12-02 MED ORDER — SODIUM CHLORIDE 0.9 % IV SOLN
Freq: Once | INTRAVENOUS | Status: AC
  Administered 2017-12-02: 10:00:00 via INTRAVENOUS

## 2017-12-02 MED ORDER — HEPARIN SOD (PORK) LOCK FLUSH 100 UNIT/ML IV SOLN
500.0000 [IU] | Freq: Once | INTRAVENOUS | Status: AC | PRN
  Administered 2017-12-02: 500 [IU]
  Filled 2017-12-02: qty 5

## 2017-12-02 MED ORDER — SODIUM CHLORIDE 0.9 % IV SOLN
Freq: Once | INTRAVENOUS | Status: AC
  Administered 2017-12-02: 11:00:00 via INTRAVENOUS
  Filled 2017-12-02: qty 5

## 2017-12-02 MED ORDER — SODIUM CHLORIDE 0.9% FLUSH
10.0000 mL | INTRAVENOUS | Status: DC | PRN
  Administered 2017-12-02: 10 mL
  Filled 2017-12-02: qty 10

## 2017-12-02 MED ORDER — VINBLASTINE SULFATE CHEMO INJECTION 1 MG/ML
6.1000 mg/m2 | Freq: Once | INTRAVENOUS | Status: AC
  Administered 2017-12-02: 13 mg via INTRAVENOUS
  Filled 2017-12-02: qty 13

## 2017-12-02 MED ORDER — SODIUM CHLORIDE 0.9 % IV SOLN
375.0000 mg/m2 | Freq: Once | INTRAVENOUS | Status: AC
  Administered 2017-12-02: 800 mg via INTRAVENOUS
  Filled 2017-12-02: qty 80

## 2017-12-02 MED ORDER — PALONOSETRON HCL INJECTION 0.25 MG/5ML
INTRAVENOUS | Status: AC
  Filled 2017-12-02: qty 5

## 2017-12-02 MED ORDER — SODIUM CHLORIDE 0.9% FLUSH
10.0000 mL | Freq: Once | INTRAVENOUS | Status: AC
  Administered 2017-12-02: 10 mL
  Filled 2017-12-02: qty 10

## 2017-12-02 MED ORDER — PALONOSETRON HCL INJECTION 0.25 MG/5ML
0.2500 mg | Freq: Once | INTRAVENOUS | Status: AC
  Administered 2017-12-02: 0.25 mg via INTRAVENOUS

## 2017-12-02 NOTE — Telephone Encounter (Signed)
Spoke with patient regarding added appts for 6/25-6/28 per 6/11 los.  Also confirmed exhisting appts.  EKG will be done at [place] visit, due to not being done today.  Patient will be contacted regarding PET Scan once order is entered.

## 2017-12-02 NOTE — Patient Instructions (Signed)
[facility] Discharge Instructions for Patients Receiving Chemotherapy  Today you received the following chemotherapy agents: Adriamycin, Vinblastine, and Dacarbazine.  To help prevent nausea and vomiting after your treatment, we encourage you to take your nausea medication as directed.   If you develop nausea and vomiting that is not controlled by your nausea medication, call the clinic.   BELOW ARE SYMPTOMS THAT SHOULD BE REPORTED IMMEDIATELY:  *FEVER GREATER THAN 100.5 F  *CHILLS WITH OR WITHOUT FEVER  NAUSEA AND VOMITING THAT IS NOT CONTROLLED WITH YOUR NAUSEA MEDICATION  *UNUSUAL SHORTNESS OF BREATH  *UNUSUAL BRUISING OR BLEEDING  TENDERNESS IN MOUTH AND THROAT WITH OR WITHOUT PRESENCE OF ULCERS  *URINARY PROBLEMS  *BOWEL PROBLEMS  UNUSUAL RASH Items with * indicate a potential emergency and should be followed up as soon as possible.  Feel free to call the clinic should you have any questions or concerns. The clinic phone number is [phone].  Please show the CHEMO ALERT CARD at check-in to the Emergency Department and triage nurse.   

## 2017-12-03 ENCOUNTER — Other Ambulatory Visit: Payer: Self-pay

## 2017-12-03 ENCOUNTER — Inpatient Hospital Stay: Payer: Medicare HMO

## 2017-12-03 DIAGNOSIS — Z5111 Encounter for antineoplastic chemotherapy: Secondary | ICD-10-CM | POA: Diagnosis not present

## 2017-12-03 DIAGNOSIS — C8121 Mixed cellularity classical Hodgkin lymphoma, lymph nodes of head, face, and neck: Secondary | ICD-10-CM

## 2017-12-03 DIAGNOSIS — Z7189 Other specified counseling: Secondary | ICD-10-CM

## 2017-12-03 MED ORDER — TBO-FILGRASTIM 480 MCG/0.8ML ~~LOC~~ SOSY
480.0000 ug | PREFILLED_SYRINGE | Freq: Once | SUBCUTANEOUS | Status: AC
  Administered 2017-12-03: 480 ug via SUBCUTANEOUS

## 2017-12-03 MED ORDER — TBO-FILGRASTIM 480 MCG/0.8ML ~~LOC~~ SOSY
PREFILLED_SYRINGE | SUBCUTANEOUS | Status: AC
  Filled 2017-12-03: qty 0.8

## 2017-12-04 ENCOUNTER — Inpatient Hospital Stay: Payer: Medicare HMO

## 2017-12-04 VITALS — BP 120/65 | HR 82 | Temp 98.0°F | Resp 18

## 2017-12-04 DIAGNOSIS — C8121 Mixed cellularity classical Hodgkin lymphoma, lymph nodes of head, face, and neck: Secondary | ICD-10-CM

## 2017-12-04 DIAGNOSIS — Z7189 Other specified counseling: Secondary | ICD-10-CM

## 2017-12-04 DIAGNOSIS — Z5111 Encounter for antineoplastic chemotherapy: Secondary | ICD-10-CM | POA: Diagnosis not present

## 2017-12-04 MED ORDER — TBO-FILGRASTIM 480 MCG/0.8ML ~~LOC~~ SOSY
PREFILLED_SYRINGE | SUBCUTANEOUS | Status: AC
  Filled 2017-12-04: qty 0.8

## 2017-12-04 MED ORDER — TBO-FILGRASTIM 480 MCG/0.8ML ~~LOC~~ SOSY
480.0000 ug | PREFILLED_SYRINGE | Freq: Once | SUBCUTANEOUS | Status: AC
  Administered 2017-12-04: 480 ug via SUBCUTANEOUS

## 2017-12-04 NOTE — Patient Instructions (Signed)
Tbo-Filgrastim injection What is this medicine? TBO-FILGRASTIM (T B O fil GRA stim) is a granulocyte colony-stimulating factor that stimulates the growth of neutrophils, a type of white blood cell important in the body's fight against infection. It is used to reduce the incidence of fever and infection in patients with certain types of cancer who are receiving chemotherapy that affects the bone marrow. This medicine may be used for other purposes; ask your health care provider or pharmacist if you have questions. COMMON BRAND NAME(S): Granix What should I tell my health care provider before I take this medicine? They need to know if you have any of these conditions: -bone scan or tests planned -kidney disease -sickle cell anemia -an unusual or allergic reaction to tbo-filgrastim, filgrastim, pegfilgrastim, other medicines, foods, dyes, or preservatives -pregnant or trying to get pregnant -breast-feeding How should I use this medicine? This medicine is for injection under the skin. If you get this medicine at home, you will be taught how to prepare and give this medicine. Refer to the Instructions for Use that come with your medication packaging. Use exactly as directed. Take your medicine at regular intervals. Do not take your medicine more often than directed. It is important that you put your used needles and syringes in a special sharps container. Do not put them in a trash can. If you do not have a sharps container, call your pharmacist or healthcare provider to get one. Talk to your pediatrician regarding the use of this medicine in children. Special care may be needed. Overdosage: If you think you have taken too much of this medicine contact a poison control center or emergency room at once. NOTE: This medicine is only for you. Do not share this medicine with others. What if I miss a dose? It is important not to miss your dose. Call your doctor or health care professional if you miss a  dose. What may interact with this medicine? This medicine may interact with the following medications: -medicines that may cause a release of neutrophils, such as lithium This list may not describe all possible interactions. Give your health care provider a list of all the medicines, herbs, non-prescription drugs, or dietary supplements you use. Also tell them if you smoke, drink alcohol, or use illegal drugs. Some items may interact with your medicine. What should I watch for while using this medicine? You may need blood work done while you are taking this medicine. What side effects may I notice from receiving this medicine? Side effects that you should report to your doctor or health care professional as soon as possible: -allergic reactions like skin rash, itching or hives, swelling of the face, lips, or tongue -blood in the urine -dark urine -dizziness -fast heartbeat -feeling faint -shortness of breath or breathing problems -signs and symptoms of infection like fever or chills; cough; or sore throat -signs and symptoms of kidney injury like trouble passing urine or change in the amount of urine -stomach or side pain, or pain at the shoulder -sweating -swelling of the legs, ankles, or abdomen -tiredness Side effects that usually do not require medical attention (report to your doctor or health care professional if they continue or are bothersome): -bone pain -headache -muscle pain -vomiting This list may not describe all possible side effects. Call your doctor for medical advice about side effects. You may report side effects to FDA at 1-800-FDA-1088. Where should I keep my medicine? Keep out of the reach of children. Store in a refrigerator between   2 and 8 degrees C (36 and 46 degrees F). Keep in carton to protect from light. Throw away this medicine if it is left out of the refrigerator for more than 5 consecutive days. Throw away any unused medicine after the expiration  date. NOTE: This sheet is a summary. It may not cover all possible information. If you have questions about this medicine, talk to your doctor, pharmacist, or health care provider.  2018 Elsevier/Gold Standard (2015-07-31 19:07:04)  

## 2017-12-05 ENCOUNTER — Inpatient Hospital Stay: Payer: Medicare HMO

## 2017-12-05 DIAGNOSIS — C8121 Mixed cellularity classical Hodgkin lymphoma, lymph nodes of head, face, and neck: Secondary | ICD-10-CM

## 2017-12-05 DIAGNOSIS — Z5111 Encounter for antineoplastic chemotherapy: Secondary | ICD-10-CM | POA: Diagnosis not present

## 2017-12-05 DIAGNOSIS — Z7189 Other specified counseling: Secondary | ICD-10-CM

## 2017-12-05 MED ORDER — TBO-FILGRASTIM 480 MCG/0.8ML ~~LOC~~ SOSY
480.0000 ug | PREFILLED_SYRINGE | Freq: Once | SUBCUTANEOUS | Status: AC
  Administered 2017-12-05: 480 ug via SUBCUTANEOUS

## 2017-12-05 MED ORDER — TBO-FILGRASTIM 480 MCG/0.8ML ~~LOC~~ SOSY
PREFILLED_SYRINGE | SUBCUTANEOUS | Status: AC
  Filled 2017-12-05: qty 0.8

## 2017-12-06 ENCOUNTER — Inpatient Hospital Stay: Payer: Medicare HMO

## 2017-12-08 ENCOUNTER — Ambulatory Visit: Payer: Medicare HMO

## 2017-12-08 ENCOUNTER — Other Ambulatory Visit: Payer: Medicare HMO

## 2017-12-08 ENCOUNTER — Ambulatory Visit: Payer: Medicare HMO | Admitting: Hematology

## 2017-12-11 NOTE — Telephone Encounter (Signed)
Error opening  

## 2017-12-15 NOTE — Progress Notes (Signed)
HEMATOLOGY/ONCOLOGY CLINIC NOTE  Date of Service: 12/16/17  Patient Care Team: Kathyrn Lass, MD as PCP - General (Family Medicine)  CHIEF COMPLAINTS/PURPOSE OF CONSULTATION:  F/u for continued mx of  Hodgkin's Lymphoma   HISTORY OF PRESENTING ILLNESS:   Jennifer Juarez is a wonderful 72 y.o. female who has been referred to [place] by Dr. Lattie Haw Miller/Courtney Rolland Porter PA_C  for evaluation and management of likely newly diagnosed Hodgkin's Lymphoma.   The pt reports that she is doing very well overall and notes that she is fully functional and does not have any significant chronic medical problems or any functional limitations.  She notes that on 06/26/17 she noticed a knot on the left side of her neck that subsequently precipitated her CT scan and Bx prior to seeing [place] today.   On 07/28/17 the pt had a CT Soft Tissue Neck revealing Enlarged lymph nodes in the left neck compatible with neoplasm.Enlarged lymph nodes in the left neck. Large left level 2 lymph node 23 x 30 mm. Multiple posterior lymph nodes are present measuring up to 10 mm. No enlarged lymph nodes in the right neck. Biopsy recommended. Attention left tonsil on direct mucosal inspection. Possible tonsillar carcinoma on the left. Lymphoma also in the differential.    Of note prior to the patient's visit, pt has had a Lymph node Needle/Core biopsy completed on 08/12/17 with results revealing ATYPICAL LYMPHOID PROLIFERATION. Immunohistochemical stains were performed including LCA, CD20, PAX-5, CD3, CD15, and CD30 with appropriate controls. The large atypical lymphoid-appearing cells are positive for CD30, CD15 and PAX-5 and negative for CD3, CD20 and LCA. The lymphocytic population in the background show a mixture of T and B-cells with predominance of T-cells. The overall findings are limited but atypical and worrisome for a lymphoproliferative process, particularly. classical Hodgkin lymphoma. Excisional biopsy is strongly  recommended.  Also on 08/12/17 the pt had a Tissue Flow Cytometry revealing NO MONOCLONAL B-CELL POPULATION OR ABNORMAL T-CELL PHENOTYPE IDENTIFIED.   Most recent CBC with Diff. results (07/15/17) revealed all values WNL.   She notes that she has had a viral infection in 2005 that resulted in a complete loss of hearing in her right ear. She notes that her left ear has had fluid build up and has resulted in significant loss of hearing, for which she uses hearing aids.  On review of systems, pt reports good energy levels, and denies fevers, chills, night sweats, unexpected weight loss, skin itching, rashes, soreness, sore throat, difficulties swallowing, back pains, abdominal pains, leg swelling, and any other symptoms.   On PMHx the pt reports arthritis, vertigo, polyps. She denies heart, lung, kidney or liver problems. She denies DM.  On Social Hx the pt notes having quit smoking when she was 70. She reports infrequent EOH consumption. She denies chemical or radiation exposure.  On Family Hx she reports that her mother had liver cancer. Her sister had breast cancer at 7 y/o and pancreatic cancer at 74 y/o.  Interval History:   Jennifer Juarez is here for a scheduled follow-up of her classical Hodgkin's Lymphoma. Today is C3D1 of her AVD treatment. The patient's last visit with [place] was on 12/02/17. The pt reports that she is doing well overall.   The pt reports that she tolerated the three injections of Granix better than the previous four. She notes that she continued to have some dizziness after receiving these injections but this has since subsided.   She also notes that a sun spot on the  front of her right leg  Has become more red in the last few days. She notes that this sun spot first appeared about a year ago, but the color has changed from brown to red. She has previously been seen by a dermatologist in [facility] and will call this clinic today.   Lab results today (12/16/17) of CBC, CMP is as  follows: all values are WNL except for RBC at 3.62, HGB at 10.1, HCT at 30.8, RDW at 18.7, Total Bilirubin <0.2.  On review of systems, pt reports good energy levels and denies mouth sores, feeling any enlarged lymph nodes, discomfort at the port site, abdominal pains, and any other symptoms.   MEDICAL HISTORY:  Past Medical History:  Diagnosis Date  . BPPV (benign paroxysmal positional vertigo)   . Cancer ([place])   . H/O seasonal allergies   . Hearing loss in right ear   . Left cervical lymphadenopathy   . Macular degeneration   . Obesity   . Osteopenia   . Pre-syncope 11/09/2017  . Retinal vein occlusion   . Vitamin D deficiency     SURGICAL HISTORY: Past Surgical History:  Procedure Laterality Date  . CHOLECYSTECTOMY    . IR FLUORO GUIDE PORT INSERTION RIGHT  10/10/2017  . IR US GUIDE VASC ACCESS RIGHT  10/10/2017  . MASS EXCISION Left 09/22/2017   Procedure: LEFT CERVICAL LYMPH NODE EXCISION;  Surgeon: Izora Gala, MD;  Location: [facility];  Service: ENT;  Laterality: Left;  . TUBAL LIGATION      SOCIAL HISTORY: Social History   Socioeconomic History  . Marital status: Single    Spouse name: Not on file  . Number of children: Not on file  . Years of education: Not on file  . Highest education level: Not on file  Occupational History  . Not on file  Social Needs  . Financial resource strain: Not on file  . Food insecurity:    Worry: Not on file    Inability: Not on file  . Transportation needs:    Medical: Not on file    Non-medical: Not on file  Tobacco Use  . Smoking status: Former Smoker    Last attempt to quit: 06/24/1988    Years since quitting: 29.4  . Smokeless tobacco: Never Used  Substance and Sexual Activity  . Alcohol use: No  . Drug use: No  . Sexual activity: Not on file  Lifestyle  . Physical activity:    Days per week: Not on file    Minutes per session: Not on file  . Stress: Not on file  Relationships  . Social connections:     Talks on phone: Not on file    Gets together: Not on file    Attends religious service: Not on file    Active member of club or organization: Not on file    Attends meetings of clubs or organizations: Not on file    Relationship status: Not on file  . Intimate partner violence:    Fear of current or ex partner: Not on file    Emotionally abused: Not on file    Physically abused: Not on file    Forced sexual activity: Not on file  Other Topics Concern  . Not on file  Social History Narrative  . Not on file    FAMILY HISTORY: Family History  Problem Relation Age of Onset  . Cancer Mother        liver  .  Heart attack Father   . Hypertension Father   . Diabetes Mellitus I Father   . Cancer Sister        pancreatic, breast  . Diabetes Mellitus I Sister   . Heart disease Brother   . Heart disease Brother   . Heart disease Brother   . Diabetes Mellitus I Sister   . Diabetes Mellitus I Sister     ALLERGIES:  is allergic to cephalexin; doxycycline; levofloxacin; prilosec [omeprazole]; and singulair [montelukast].  MEDICATIONS:  Current Outpatient Medications  Medication Sig Dispense Refill  . acetaminophen (TYLENOL) 500 MG tablet Take 500 mg by mouth every 8 (eight) hours as needed for mild pain or moderate pain.     Marland Kitchen azithromycin (ZITHROMAX Z-PAK) 250 MG tablet Take 2 tablets by mouth on day one, then take 1 tablet daily for 4 days. 6 each 0  . dexamethasone (DECADRON) 4 MG tablet Take 2 tablets by mouth once a day on the day after chemotherapy and then take 2 tablets two times a day for 2 days. Take with food. 30 tablet 1  . lidocaine-prilocaine (EMLA) cream Apply to affected area once (Patient taking differently: Apply 1 application topically daily as needed. Port) 30 g 3  . LORazepam (ATIVAN) 0.5 MG tablet Take 1 tablet (0.5 mg total) by mouth every 8 (eight) hours as needed for anxiety (nausea). 30 tablet 0  . ondansetron (ZOFRAN) 8 MG tablet Take 1 tablet (8 mg total)  by mouth 2 (two) times daily as needed. Start on the third day after chemotherapy. 30 tablet 1  . polyethylene glycol (MIRALAX / GLYCOLAX) packet Take 17 g by mouth daily.    . prochlorperazine (COMPAZINE) 10 MG tablet Take 1 tablet (10 mg total) by mouth every 6 (six) hours as needed (Nausea or vomiting). 30 tablet 1  . promethazine (PHENERGAN) 25 MG suppository Place 1 suppository (25 mg total) rectally every 6 (six) hours as needed for nausea or vomiting. 12 suppository 1  . triamcinolone (NASACORT AQ) 55 MCG/ACT AERO nasal inhaler Place 2 sprays into the nose daily as needed (allergies).      No current facility-administered medications for this visit.     REVIEW OF SYSTEMS:    A 10+ POINT REVIEW OF SYSTEMS WAS OBTAINED including neurology, dermatology, psychiatry, cardiac, respiratory, lymph, extremities, GI, GU, Musculoskeletal, constitutional, breasts, reproductive, HEENT.  All pertinent positives are noted in the HPI.  All others are negative.   PHYSICAL EXAMINATION: ECOG PERFORMANCE STATUS: 1 BP 138/84 (BP Location: Left Arm, Patient Position: Sitting)   Pulse 91   Temp 98.3 F (36.8 C) (Oral)   Resp 18   Ht 5\' 8"  (1.727 m)   Wt 188 lb (85.3 kg)   SpO2 99%   BMI 28.59 kg/m   GENERAL:alert, in no acute distress and comfortable SKIN: Fleshy, reddish-brown, plaque-like region on outer aspect of right leg EYES: conjunctiva are pink and non-injected, sclera anicteric OROPHARYNX: MMM, no exudates, no oropharyngeal erythema or ulceration NECK: supple, no JVD LYMPH:  no palpable lymphadenopathy in the cervical, axillary or inguinal regions LUNGS: clear to auscultation b/l with normal respiratory effort HEART: regular rate & rhythm ABDOMEN:  normoactive bowel sounds , non tender, not distended. Extremity: no pedal edema PSYCH: alert & oriented x 3 with fluent speech NEURO: no focal motor/sensory deficits   LABORATORY DATA:  I have reviewed the data as listed  . CBC Latest  Ref Rng & Units 12/16/2017 12/02/2017 11/18/2017  WBC 3.9 - 10.3 K/uL  4.5 5.7 16.4(H)  Hemoglobin 11.6 - 15.9 g/dL 10.1(L) 9.8(L) 10.3(L)  Hematocrit 34.8 - 46.6 % 30.8(L) 31.5(L) 31.6(L)  Platelets 145 - 400 K/uL 377 386 349  ANC 8.3  . CMP Latest Ref Rng & Units 12/16/2017 12/02/2017 11/18/2017  Glucose 70 - 99 mg/dL 93 100 95  BUN 8 - 23 mg/dL 9 8 8   Creatinine 0.44 - 1.00 mg/dL 0.73 0.71 0.72  Sodium 135 - 145 mmol/L 139 138 136  Potassium 3.5 - 5.1 mmol/L 3.8 4.1 3.8  Chloride 98 - 111 mmol/L 107 106 105  CO2 22 - 32 mmol/L 23 23 22   Calcium 8.9 - 10.3 mg/dL 9.6 9.7 9.4  Total Protein 6.5 - 8.1 g/dL 6.6 6.7 6.8  Total Bilirubin 0.3 - 1.2 mg/dL <0.2(L) <0.2(L) 0.4  Alkaline Phos 38 - 126 U/L 66 83 89  AST 15 - 41 U/L 18 15 15   ALT 0 - 44 U/L 11 15 14      Component     Latest Ref Rng & Units 08/29/2017  Retic Ct Pct     0.7 - 2.1 % 0.8  RBC.     3.70 - 5.45 MIL/uL 4.60  Retic Count, Absolute     33.7 - 90.7 K/uL 36.8  Sed Rate     0 - 22 mm/hr 30 (H)  LDH     125 - 245 U/L 179  HCV Ab     0.0 - 0.9 s/co ratio <0.1  Hepatitis B Surface Ag     Negative Negative  Hep B Core Ab, Tot     Negative Negative     08/12/17 Lymph Node Needle/core Biopsy:   08/12/17 Tissue Flow Cytometry:    09/22/17 Tissue Flow Cytometry    09/22/17 Lymph Node Pathology:   RADIOGRAPHIC STUDIES: I have personally reviewed the radiological images as listed and agreed with the findings in the report. No results found.  ASSESSMENT & PLAN:   72 y.o. female with  1. Stage IA Hodgkin's Lymphoma (mixed cellularity) No constitutional symptoms Sed rate elevated to 30 PET findings most consistent with Stage I disease; discussed that a small hypermetabolic nodule found in her liver has remain unchanged for 4 years and is not considered to be involved with her Hodgkin's lymphoma.  -Left cervical lymph nodes are involved without further involvement.   PLAN -Baseline EKG - no evidence of  abnormal rhythm -Asked pt to let me know if she has another episode of tachycardia and will order an EKG -Discussed IV fluids to address dehydration indicated by her dizziness and tachycardia; pt would like to wait on this.  -Have decreased Granix shots from 4 to 3 per patients preference  -Pt is okay to take tylenol for bone pains and she will let us know if she gets too uncomfortable after growth factors.  -Pt will avoid crowds and individuals with infections   -Recommended that while she receives chemotherapy, to eat popsicles and ice chips to reduce mouth sores, and using salt and baking soda rinse.  -Previously discussed 3 cycles of AVD followed by either observation or involved-site radiation.  -She is fine to continue ativan as needed for her occasional vertigo.  -Discussed pt labwork today, 12/16/17; blood counts and chemistries are stable - ANC normal at 2.1k -Will place a referral at [facility]  -The pt has no prohibitive toxicities from continuing with C3 of AVD at this time.  -Continue using salt/baking soda mouthwashes  -Continue optimizing nutritional  status and hydration, and walking 20-30 minutes each day -PET scan in 4 weeks   Please schedule C3D15 treatment in 15 days with 3 days of granix after that. RTC with Dr Irene Limbo in 15 days with labs with C3D15 (okay to [facility]) Dermatology referral to [facility] dermatology in [place] - rt leg skin lesion-growing. PET/CT in 4 weeks    All of the patients questions were answered with apparent satisfaction. The patient knows to call the clinic with any problems, questions or concerns.  The toal time spent in the appt was 25 minutes and more than 50% was on counseling and direct patient cares.    Sullivan Lone MD MS AAHIVMS [facility] Hematology/Oncology Physician [facility]  (Office):       [phone] (Work cell):  [phone] (Fax):           [phone]  12/16/2017 9:14  AM  I, Baldwin Jamaica, am acting as a Education administrator for Dr Irene Limbo.   .I have reviewed the above documentation for accuracy and completeness, and I agree with the above. Brunetta Genera MD

## 2017-12-16 ENCOUNTER — Inpatient Hospital Stay: Payer: Medicare HMO

## 2017-12-16 ENCOUNTER — Inpatient Hospital Stay (HOSPITAL_BASED_OUTPATIENT_CLINIC_OR_DEPARTMENT_OTHER): Payer: Medicare HMO | Admitting: Hematology

## 2017-12-16 ENCOUNTER — Encounter: Payer: Self-pay | Admitting: Hematology

## 2017-12-16 VITALS — BP 138/84 | HR 91 | Temp 98.3°F | Resp 18 | Ht 68.0 in | Wt 188.0 lb

## 2017-12-16 DIAGNOSIS — Z95828 Presence of other vascular implants and grafts: Secondary | ICD-10-CM

## 2017-12-16 DIAGNOSIS — Z5111 Encounter for antineoplastic chemotherapy: Secondary | ICD-10-CM

## 2017-12-16 DIAGNOSIS — R000 Tachycardia, unspecified: Secondary | ICD-10-CM

## 2017-12-16 DIAGNOSIS — Z87891 Personal history of nicotine dependence: Secondary | ICD-10-CM | POA: Diagnosis not present

## 2017-12-16 DIAGNOSIS — R700 Elevated erythrocyte sedimentation rate: Secondary | ICD-10-CM | POA: Diagnosis not present

## 2017-12-16 DIAGNOSIS — R42 Dizziness and giddiness: Secondary | ICD-10-CM | POA: Diagnosis not present

## 2017-12-16 DIAGNOSIS — C8121 Mixed cellularity classical Hodgkin lymphoma, lymph nodes of head, face, and neck: Secondary | ICD-10-CM

## 2017-12-16 DIAGNOSIS — E860 Dehydration: Secondary | ICD-10-CM | POA: Diagnosis not present

## 2017-12-16 DIAGNOSIS — K769 Liver disease, unspecified: Secondary | ICD-10-CM

## 2017-12-16 DIAGNOSIS — Z7189 Other specified counseling: Secondary | ICD-10-CM

## 2017-12-16 LAB — CMP (CANCER CENTER ONLY)
ALT: 11 U/L (ref 0–44)
AST: 18 U/L (ref 15–41)
Albumin: 3.6 g/dL (ref 3.5–5.0)
Alkaline Phosphatase: 66 U/L (ref 38–126)
Anion gap: 9 (ref 5–15)
BUN: 9 mg/dL (ref 8–23)
CHLORIDE: 107 mmol/L (ref 98–111)
CO2: 23 mmol/L (ref 22–32)
Calcium: 9.6 mg/dL (ref 8.9–10.3)
Creatinine: 0.73 mg/dL (ref 0.44–1.00)
GFR, Est AFR Am: >60 mL/min (ref >60)
Glucose, Bld: 93 mg/dL (ref 70–99)
POTASSIUM: 3.8 mmol/L (ref 3.5–5.1)
Sodium: 139 mmol/L (ref 135–145)
TOTAL PROTEIN: 6.6 g/dL (ref 6.5–8.1)

## 2017-12-16 LAB — CBC WITH DIFFERENTIAL/PLATELET
BASOS ABS: 0.1 10*3/uL (ref 0.0–0.1)
Basophils Relative: 2 %
EOS ABS: 0.1 10*3/uL (ref 0.0–0.5)
EOS PCT: 3 %
HCT: 30.8 % — ABNORMAL LOW (ref 34.8–46.6)
HEMOGLOBIN: 10.1 g/dL — AB (ref 11.6–15.9)
LYMPHS PCT: 32 %
Lymphs Abs: 1.4 10*3/uL (ref 0.9–3.3)
MCH: 27.8 pg (ref 25.1–34.0)
MCHC: 32.7 g/dL (ref 31.5–36.0)
MCV: 85.1 fL (ref 79.5–101.0)
Monocytes Absolute: 0.8 10*3/uL (ref 0.1–0.9)
Monocytes Relative: 17 %
NEUTROS PCT: 46 %
Neutro Abs: 2.1 10*3/uL (ref 1.5–6.5)
PLATELETS: 377 10*3/uL (ref 145–400)
RBC: 3.62 MIL/uL — AB (ref 3.70–5.45)
RDW: 18.7 % — ABNORMAL HIGH (ref 11.2–14.5)
WBC: 4.5 10*3/uL (ref 3.9–10.3)

## 2017-12-16 MED ORDER — SODIUM CHLORIDE 0.9 % IV SOLN
375.0000 mg/m2 | Freq: Once | INTRAVENOUS | Status: AC
  Administered 2017-12-16: 800 mg via INTRAVENOUS
  Filled 2017-12-16: qty 80

## 2017-12-16 MED ORDER — SODIUM CHLORIDE 0.9 % IV SOLN
Freq: Once | INTRAVENOUS | Status: AC
  Administered 2017-12-16: 10:00:00 via INTRAVENOUS
  Filled 2017-12-16: qty 5

## 2017-12-16 MED ORDER — PALONOSETRON HCL INJECTION 0.25 MG/5ML
0.2500 mg | Freq: Once | INTRAVENOUS | Status: AC
  Administered 2017-12-16: 0.25 mg via INTRAVENOUS

## 2017-12-16 MED ORDER — PALONOSETRON HCL INJECTION 0.25 MG/5ML
INTRAVENOUS | Status: AC
Start: 2017-12-16 — End: ?
  Filled 2017-12-16: qty 5

## 2017-12-16 MED ORDER — VINBLASTINE SULFATE CHEMO INJECTION 1 MG/ML
6.1000 mg/m2 | Freq: Once | INTRAVENOUS | Status: AC
  Administered 2017-12-16: 13 mg via INTRAVENOUS
  Filled 2017-12-16: qty 13

## 2017-12-16 MED ORDER — SODIUM CHLORIDE 0.9% FLUSH
10.0000 mL | Freq: Once | INTRAVENOUS | Status: AC
  Administered 2017-12-16: 10 mL
  Filled 2017-12-16: qty 10

## 2017-12-16 MED ORDER — SODIUM CHLORIDE 0.9% FLUSH
10.0000 mL | INTRAVENOUS | Status: DC | PRN
  Administered 2017-12-16: 10 mL
  Filled 2017-12-16: qty 10

## 2017-12-16 MED ORDER — HEPARIN SOD (PORK) LOCK FLUSH 100 UNIT/ML IV SOLN
500.0000 [IU] | Freq: Once | INTRAVENOUS | Status: AC | PRN
  Administered 2017-12-16: 500 [IU]
  Filled 2017-12-16: qty 5

## 2017-12-16 MED ORDER — SODIUM CHLORIDE 0.9 % IV SOLN
Freq: Once | INTRAVENOUS | Status: AC
  Administered 2017-12-16: 10:00:00 via INTRAVENOUS

## 2017-12-16 MED ORDER — DOXORUBICIN HCL CHEMO IV INJECTION 2 MG/ML
25.0000 mg/m2 | Freq: Once | INTRAVENOUS | Status: AC
  Administered 2017-12-16: 54 mg via INTRAVENOUS
  Filled 2017-12-16: qty 27

## 2017-12-16 NOTE — Patient Instructions (Signed)
[facility] Discharge Instructions for Patients Receiving Chemotherapy  Today you received the following chemotherapy agents Adriamycin,Vinblastine,Dacarbazine  To help prevent nausea and vomiting after your treatment, we encourage you to take your nausea medication as directed.   If you develop nausea and vomiting that is not controlled by your nausea medication, call the clinic.   BELOW ARE SYMPTOMS THAT SHOULD BE REPORTED IMMEDIATELY:  *FEVER GREATER THAN 100.5 F  *CHILLS WITH OR WITHOUT FEVER  NAUSEA AND VOMITING THAT IS NOT CONTROLLED WITH YOUR NAUSEA MEDICATION  *UNUSUAL SHORTNESS OF BREATH  *UNUSUAL BRUISING OR BLEEDING  TENDERNESS IN MOUTH AND THROAT WITH OR WITHOUT PRESENCE OF ULCERS  *URINARY PROBLEMS  *BOWEL PROBLEMS  UNUSUAL RASH Items with * indicate a potential emergency and should be followed up as soon as possible.  Feel free to call the clinic should you have any questions or concerns. The clinic phone number is [phone].  Please show the CHEMO ALERT CARD at check-in to the Emergency Department and triage nurse.   

## 2017-12-17 ENCOUNTER — Telehealth: Payer: Self-pay

## 2017-12-17 ENCOUNTER — Encounter: Payer: Self-pay | Admitting: Hematology

## 2017-12-17 ENCOUNTER — Inpatient Hospital Stay: Payer: Medicare HMO

## 2017-12-17 VITALS — BP 130/68 | HR 80 | Temp 97.9°F | Resp 18

## 2017-12-17 DIAGNOSIS — Z7189 Other specified counseling: Secondary | ICD-10-CM

## 2017-12-17 DIAGNOSIS — C8121 Mixed cellularity classical Hodgkin lymphoma, lymph nodes of head, face, and neck: Secondary | ICD-10-CM

## 2017-12-17 DIAGNOSIS — Z5111 Encounter for antineoplastic chemotherapy: Secondary | ICD-10-CM | POA: Diagnosis not present

## 2017-12-17 MED ORDER — TBO-FILGRASTIM 480 MCG/0.8ML ~~LOC~~ SOSY
PREFILLED_SYRINGE | SUBCUTANEOUS | Status: AC
  Filled 2017-12-17: qty 0.8

## 2017-12-17 MED ORDER — TBO-FILGRASTIM 480 MCG/0.8ML ~~LOC~~ SOSY
480.0000 ug | PREFILLED_SYRINGE | Freq: Once | SUBCUTANEOUS | Status: AC
  Administered 2017-12-17: 480 ug via SUBCUTANEOUS

## 2017-12-17 NOTE — Progress Notes (Signed)
Verbal consent granted by Dr. Irene Limbo to give Granix at 1125 today.

## 2017-12-17 NOTE — Telephone Encounter (Signed)
Due to infusion had a opening for early morning (8:30am) or late afternoon causing infusion to run past 4pm. Had to split the appointment causing patient to come daily. She is aware of these appointments. And agreed

## 2017-12-17 NOTE — Patient Instructions (Signed)
Tbo-Filgrastim injection What is this medicine? TBO-FILGRASTIM (T B O fil GRA stim) is a granulocyte colony-stimulating factor that stimulates the growth of neutrophils, a type of white blood cell important in the body's fight against infection. It is used to reduce the incidence of fever and infection in patients with certain types of cancer who are receiving chemotherapy that affects the bone marrow. This medicine may be used for other purposes; ask your health care provider or pharmacist if you have questions. COMMON BRAND NAME(S): Granix What should I tell my health care provider before I take this medicine? They need to know if you have any of these conditions: -bone scan or tests planned -kidney disease -sickle cell anemia -an unusual or allergic reaction to tbo-filgrastim, filgrastim, pegfilgrastim, other medicines, foods, dyes, or preservatives -pregnant or trying to get pregnant -breast-feeding How should I use this medicine? This medicine is for injection under the skin. If you get this medicine at home, you will be taught how to prepare and give this medicine. Refer to the Instructions for Use that come with your medication packaging. Use exactly as directed. Take your medicine at regular intervals. Do not take your medicine more often than directed. It is important that you put your used needles and syringes in a special sharps container. Do not put them in a trash can. If you do not have a sharps container, call your pharmacist or healthcare provider to get one. Talk to your pediatrician regarding the use of this medicine in children. Special care may be needed. Overdosage: If you think you have taken too much of this medicine contact a poison control center or emergency room at once. NOTE: This medicine is only for you. Do not share this medicine with others. What if I miss a dose? It is important not to miss your dose. Call your doctor or health care professional if you miss a  dose. What may interact with this medicine? This medicine may interact with the following medications: -medicines that may cause a release of neutrophils, such as lithium This list may not describe all possible interactions. Give your health care provider a list of all the medicines, herbs, non-prescription drugs, or dietary supplements you use. Also tell them if you smoke, drink alcohol, or use illegal drugs. Some items may interact with your medicine. What should I watch for while using this medicine? You may need blood work done while you are taking this medicine. What side effects may I notice from receiving this medicine? Side effects that you should report to your doctor or health care professional as soon as possible: -allergic reactions like skin rash, itching or hives, swelling of the face, lips, or tongue -blood in the urine -dark urine -dizziness -fast heartbeat -feeling faint -shortness of breath or breathing problems -signs and symptoms of infection like fever or chills; cough; or sore throat -signs and symptoms of kidney injury like trouble passing urine or change in the amount of urine -stomach or side pain, or pain at the shoulder -sweating -swelling of the legs, ankles, or abdomen -tiredness Side effects that usually do not require medical attention (report to your doctor or health care professional if they continue or are bothersome): -bone pain -headache -muscle pain -vomiting This list may not describe all possible side effects. Call your doctor for medical advice about side effects. You may report side effects to FDA at 1-800-FDA-1088. Where should I keep my medicine? Keep out of the reach of children. Store in a refrigerator between   2 and 8 degrees C (36 and 46 degrees F). Keep in carton to protect from light. Throw away this medicine if it is left out of the refrigerator for more than 5 consecutive days. Throw away any unused medicine after the expiration  date. NOTE: This sheet is a summary. It may not cover all possible information. If you have questions about this medicine, talk to your doctor, pharmacist, or health care provider.  2018 Elsevier/Gold Standard (2015-07-31 19:07:04)  

## 2017-12-18 ENCOUNTER — Telehealth: Payer: Self-pay

## 2017-12-18 ENCOUNTER — Inpatient Hospital Stay: Payer: Medicare HMO

## 2017-12-18 VITALS — BP 126/72 | HR 78 | Temp 98.2°F | Resp 20

## 2017-12-18 DIAGNOSIS — Z7189 Other specified counseling: Secondary | ICD-10-CM

## 2017-12-18 DIAGNOSIS — Z5111 Encounter for antineoplastic chemotherapy: Secondary | ICD-10-CM | POA: Diagnosis not present

## 2017-12-18 DIAGNOSIS — C8121 Mixed cellularity classical Hodgkin lymphoma, lymph nodes of head, face, and neck: Secondary | ICD-10-CM

## 2017-12-18 MED ORDER — TBO-FILGRASTIM 480 MCG/0.8ML ~~LOC~~ SOSY
480.0000 ug | PREFILLED_SYRINGE | Freq: Once | SUBCUTANEOUS | Status: AC
  Administered 2017-12-18: 480 ug via SUBCUTANEOUS

## 2017-12-18 MED ORDER — TBO-FILGRASTIM 480 MCG/0.8ML ~~LOC~~ SOSY
PREFILLED_SYRINGE | SUBCUTANEOUS | Status: AC
  Filled 2017-12-18: qty 0.8

## 2017-12-18 NOTE — Patient Instructions (Signed)
Tbo-Filgrastim injection What is this medicine? TBO-FILGRASTIM (T B O fil GRA stim) is a granulocyte colony-stimulating factor that stimulates the growth of neutrophils, a type of white blood cell important in the body's fight against infection. It is used to reduce the incidence of fever and infection in patients with certain types of cancer who are receiving chemotherapy that affects the bone marrow. This medicine may be used for other purposes; ask your health care provider or pharmacist if you have questions. COMMON BRAND NAME(S): Granix What should I tell my health care provider before I take this medicine? They need to know if you have any of these conditions: -bone scan or tests planned -kidney disease -sickle cell anemia -an unusual or allergic reaction to tbo-filgrastim, filgrastim, pegfilgrastim, other medicines, foods, dyes, or preservatives -pregnant or trying to get pregnant -breast-feeding How should I use this medicine? This medicine is for injection under the skin. If you get this medicine at home, you will be taught how to prepare and give this medicine. Refer to the Instructions for Use that come with your medication packaging. Use exactly as directed. Take your medicine at regular intervals. Do not take your medicine more often than directed. It is important that you put your used needles and syringes in a special sharps container. Do not put them in a trash can. If you do not have a sharps container, call your pharmacist or healthcare provider to get one. Talk to your pediatrician regarding the use of this medicine in children. Special care may be needed. Overdosage: If you think you have taken too much of this medicine contact a poison control center or emergency room at once. NOTE: This medicine is only for you. Do not share this medicine with others. What if I miss a dose? It is important not to miss your dose. Call your doctor or health care professional if you miss a  dose. What may interact with this medicine? This medicine may interact with the following medications: -medicines that may cause a release of neutrophils, such as lithium This list may not describe all possible interactions. Give your health care provider a list of all the medicines, herbs, non-prescription drugs, or dietary supplements you use. Also tell them if you smoke, drink alcohol, or use illegal drugs. Some items may interact with your medicine. What should I watch for while using this medicine? You may need blood work done while you are taking this medicine. What side effects may I notice from receiving this medicine? Side effects that you should report to your doctor or health care professional as soon as possible: -allergic reactions like skin rash, itching or hives, swelling of the face, lips, or tongue -blood in the urine -dark urine -dizziness -fast heartbeat -feeling faint -shortness of breath or breathing problems -signs and symptoms of infection like fever or chills; cough; or sore throat -signs and symptoms of kidney injury like trouble passing urine or change in the amount of urine -stomach or side pain, or pain at the shoulder -sweating -swelling of the legs, ankles, or abdomen -tiredness Side effects that usually do not require medical attention (report to your doctor or health care professional if they continue or are bothersome): -bone pain -headache -muscle pain -vomiting This list may not describe all possible side effects. Call your doctor for medical advice about side effects. You may report side effects to FDA at 1-800-FDA-1088. Where should I keep my medicine? Keep out of the reach of children. Store in a refrigerator between   2 and 8 degrees C (36 and 46 degrees F). Keep in carton to protect from light. Throw away this medicine if it is left out of the refrigerator for more than 5 consecutive days. Throw away any unused medicine after the expiration  date. NOTE: This sheet is a summary. It may not cover all possible information. If you have questions about this medicine, talk to your doctor, pharmacist, or health care provider.  2018 Elsevier/Gold Standard (2015-07-31 19:07:04)  

## 2017-12-18 NOTE — Telephone Encounter (Signed)
Per 6/27 walk in patient requested a port to be added to her schedule appointments on 7/8. Patient has MyChart

## 2017-12-18 NOTE — Telephone Encounter (Signed)
Patient called and she does not want her port used for her lab appointment on 12/29/17. Patient prefers a peripheral stick. Port flush cancelled per patient request.

## 2017-12-19 ENCOUNTER — Inpatient Hospital Stay: Payer: Medicare HMO

## 2017-12-19 VITALS — BP 118/68 | HR 78 | Temp 98.4°F | Resp 20

## 2017-12-19 DIAGNOSIS — Z7189 Other specified counseling: Secondary | ICD-10-CM

## 2017-12-19 DIAGNOSIS — Z5111 Encounter for antineoplastic chemotherapy: Secondary | ICD-10-CM | POA: Diagnosis not present

## 2017-12-19 DIAGNOSIS — C8121 Mixed cellularity classical Hodgkin lymphoma, lymph nodes of head, face, and neck: Secondary | ICD-10-CM

## 2017-12-19 MED ORDER — TBO-FILGRASTIM 480 MCG/0.8ML ~~LOC~~ SOSY
480.0000 ug | PREFILLED_SYRINGE | Freq: Once | SUBCUTANEOUS | Status: AC
  Administered 2017-12-19: 480 ug via SUBCUTANEOUS

## 2017-12-19 MED ORDER — TBO-FILGRASTIM 480 MCG/0.8ML ~~LOC~~ SOSY
PREFILLED_SYRINGE | SUBCUTANEOUS | Status: AC
  Filled 2017-12-19: qty 0.8

## 2017-12-19 NOTE — Patient Instructions (Signed)
Tbo-Filgrastim injection What is this medicine? TBO-FILGRASTIM (T B O fil GRA stim) is a granulocyte colony-stimulating factor that stimulates the growth of neutrophils, a type of white blood cell important in the body's fight against infection. It is used to reduce the incidence of fever and infection in patients with certain types of cancer who are receiving chemotherapy that affects the bone marrow. This medicine may be used for other purposes; ask your health care provider or pharmacist if you have questions. COMMON BRAND NAME(S): Granix What should I tell my health care provider before I take this medicine? They need to know if you have any of these conditions: -bone scan or tests planned -kidney disease -sickle cell anemia -an unusual or allergic reaction to tbo-filgrastim, filgrastim, pegfilgrastim, other medicines, foods, dyes, or preservatives -pregnant or trying to get pregnant -breast-feeding How should I use this medicine? This medicine is for injection under the skin. If you get this medicine at home, you will be taught how to prepare and give this medicine. Refer to the Instructions for Use that come with your medication packaging. Use exactly as directed. Take your medicine at regular intervals. Do not take your medicine more often than directed. It is important that you put your used needles and syringes in a special sharps container. Do not put them in a trash can. If you do not have a sharps container, call your pharmacist or healthcare provider to get one. Talk to your pediatrician regarding the use of this medicine in children. Special care may be needed. Overdosage: If you think you have taken too much of this medicine contact a poison control center or emergency room at once. NOTE: This medicine is only for you. Do not share this medicine with others. What if I miss a dose? It is important not to miss your dose. Call your doctor or health care professional if you miss a  dose. What may interact with this medicine? This medicine may interact with the following medications: -medicines that may cause a release of neutrophils, such as lithium This list may not describe all possible interactions. Give your health care provider a list of all the medicines, herbs, non-prescription drugs, or dietary supplements you use. Also tell them if you smoke, drink alcohol, or use illegal drugs. Some items may interact with your medicine. What should I watch for while using this medicine? You may need blood work done while you are taking this medicine. What side effects may I notice from receiving this medicine? Side effects that you should report to your doctor or health care professional as soon as possible: -allergic reactions like skin rash, itching or hives, swelling of the face, lips, or tongue -blood in the urine -dark urine -dizziness -fast heartbeat -feeling faint -shortness of breath or breathing problems -signs and symptoms of infection like fever or chills; cough; or sore throat -signs and symptoms of kidney injury like trouble passing urine or change in the amount of urine -stomach or side pain, or pain at the shoulder -sweating -swelling of the legs, ankles, or abdomen -tiredness Side effects that usually do not require medical attention (report to your doctor or health care professional if they continue or are bothersome): -bone pain -headache -muscle pain -vomiting This list may not describe all possible side effects. Call your doctor for medical advice about side effects. You may report side effects to FDA at 1-800-FDA-1088. Where should I keep my medicine? Keep out of the reach of children. Store in a refrigerator between   2 and 8 degrees C (36 and 46 degrees F). Keep in carton to protect from light. Throw away this medicine if it is left out of the refrigerator for more than 5 consecutive days. Throw away any unused medicine after the expiration  date. NOTE: This sheet is a summary. It may not cover all possible information. If you have questions about this medicine, talk to your doctor, pharmacist, or health care provider.  2018 Elsevier/Gold Standard (2015-07-31 19:07:04)  

## 2017-12-22 ENCOUNTER — Other Ambulatory Visit: Payer: Medicare HMO

## 2017-12-22 ENCOUNTER — Ambulatory Visit: Payer: Medicare HMO

## 2017-12-23 ENCOUNTER — Telehealth: Payer: Self-pay | Admitting: Hematology

## 2017-12-23 NOTE — Telephone Encounter (Signed)
Faxed medical records to [facility] @ [phone]. Release ID# [id number]

## 2017-12-26 NOTE — Progress Notes (Signed)
HEMATOLOGY/ONCOLOGY CLINIC NOTE  Date of Service: 12/29/17    Patient Care Team: Kathyrn Lass, MD as PCP - General (Family Medicine)  CHIEF COMPLAINTS/PURPOSE OF CONSULTATION:  F/u for continued mx of  Hodgkin's Lymphoma   HISTORY OF PRESENTING ILLNESS:   Jennifer Juarez is a wonderful 72 y.o. female who has been referred to [place] by Dr. Lattie Haw Miller/Courtney Rolland Porter PA_C  for evaluation and management of likely newly diagnosed Hodgkin's Lymphoma.   The pt reports that she is doing very well overall and notes that she is fully functional and does not have any significant chronic medical problems or any functional limitations.  She notes that on 06/26/17 she noticed a knot on the left side of her neck that subsequently precipitated her CT scan and Bx prior to seeing [place] today.   On 07/28/17 the pt had a CT Soft Tissue Neck revealing Enlarged lymph nodes in the left neck compatible with neoplasm.Enlarged lymph nodes in the left neck. Large left level 2 lymph node 23 x 30 mm. Multiple posterior lymph nodes are present measuring up to 10 mm. No enlarged lymph nodes in the right neck. Biopsy recommended. Attention left tonsil on direct mucosal inspection. Possible tonsillar carcinoma on the left. Lymphoma also in the differential.    Of note prior to the patient's visit, pt has had a Lymph node Needle/Core biopsy completed on 08/12/17 with results revealing ATYPICAL LYMPHOID PROLIFERATION. Immunohistochemical stains were performed including LCA, CD20, PAX-5, CD3, CD15, and CD30 with appropriate controls. The large atypical lymphoid-appearing cells are positive for CD30, CD15 and PAX-5 and negative for CD3, CD20 and LCA. The lymphocytic population in the background show a mixture of T and B-cells with predominance of T-cells. The overall findings are limited but atypical and worrisome for a lymphoproliferative process, particularly. classical Hodgkin lymphoma. Excisional biopsy is strongly  recommended.  Also on 08/12/17 the pt had a Tissue Flow Cytometry revealing NO MONOCLONAL B-CELL POPULATION OR ABNORMAL T-CELL PHENOTYPE IDENTIFIED.   Most recent CBC with Diff. results (07/15/17) revealed all values WNL.   She notes that she has had a viral infection in 2005 that resulted in a complete loss of hearing in her right ear. She notes that her left ear has had fluid build up and has resulted in significant loss of hearing, for which she uses hearing aids.  On review of systems, pt reports good energy levels, and denies fevers, chills, night sweats, unexpected weight loss, skin itching, rashes, soreness, sore throat, difficulties swallowing, back pains, abdominal pains, leg swelling, and any other symptoms.   On PMHx the pt reports arthritis, vertigo, polyps. She denies heart, lung, kidney or liver problems. She denies DM.  On Social Hx the pt notes having quit smoking when she was 5. She reports infrequent EOH consumption. She denies chemical or radiation exposure.  On Family Hx she reports that her mother had liver cancer. Her sister had breast cancer at 46 y/o and pancreatic cancer at 74 y/o.  Interval History:   [name] is here for a scheduled follow-up of her classical Hodgkin's Lymphoma. Today is C3D14 of her AVD treatment. The patient's last visit with [place] was on 12/16/17. The pt reports that she is doing well overall.   The pt reports that she has been eating very well, and has especially been eating lots of vegetables and is surprised to hear she has lost about 5 pounds in the last month.   Lab results today (12/29/17) of CBC w/diff, CMP  is as follows: all values are WNL except for WBC at 3.8k, RBC at 3.65, HGB at 10.2, HCT at 32.6, MCHC at 31.3, RDW at 18.4, ANC at 1.3k, Glucose at 102, Total Bilirubin at <0.2.  On review of systems, pt reports good energy levels, eating well, regular and normal bowel movements, and denies fevers, chills, night sweats, mouth sores, leg swelling,  abdominal pains, and any other symptoms.    MEDICAL HISTORY:  Past Medical History:  Diagnosis Date  . BPPV (benign paroxysmal positional vertigo)   . Cancer ([place])   . H/O seasonal allergies   . Hearing loss in right ear   . Left cervical lymphadenopathy   . Macular degeneration   . Obesity   . Osteopenia   . Pre-syncope 11/09/2017  . Retinal vein occlusion   . Vitamin D deficiency     SURGICAL HISTORY: Past Surgical History:  Procedure Laterality Date  . CHOLECYSTECTOMY    . IR FLUORO GUIDE PORT INSERTION RIGHT  10/10/2017  . IR US GUIDE VASC ACCESS RIGHT  10/10/2017  . MASS EXCISION Left 09/22/2017   Procedure: LEFT CERVICAL LYMPH NODE EXCISION;  Surgeon: Izora Gala, MD;  Location: [facility];  Service: ENT;  Laterality: Left;  . TUBAL LIGATION      SOCIAL HISTORY: Social History   Socioeconomic History  . Marital status: Single    Spouse name: Not on file  . Number of children: Not on file  . Years of education: Not on file  . Highest education level: Not on file  Occupational History  . Not on file  Social Needs  . Financial resource strain: Not on file  . Food insecurity:    Worry: Not on file    Inability: Not on file  . Transportation needs:    Medical: Not on file    Non-medical: Not on file  Tobacco Use  . Smoking status: Former Smoker    Last attempt to quit: 06/24/1988    Years since quitting: 29.5  . Smokeless tobacco: Never Used  Substance and Sexual Activity  . Alcohol use: No  . Drug use: No  . Sexual activity: Not on file  Lifestyle  . Physical activity:    Days per week: Not on file    Minutes per session: Not on file  . Stress: Not on file  Relationships  . Social connections:    Talks on phone: Not on file    Gets together: Not on file    Attends religious service: Not on file    Active member of club or organization: Not on file    Attends meetings of clubs or organizations: Not on file    Relationship status: Not on  file  . Intimate partner violence:    Fear of current or ex partner: Not on file    Emotionally abused: Not on file    Physically abused: Not on file    Forced sexual activity: Not on file  Other Topics Concern  . Not on file  Social History Narrative  . Not on file    FAMILY HISTORY: Family History  Problem Relation Age of Onset  . Cancer Mother        liver  . Heart attack Father   . Hypertension Father   . Diabetes Mellitus I Father   . Cancer Sister        pancreatic, breast  . Diabetes Mellitus I Sister   . Heart disease Brother   .  Heart disease Brother   . Heart disease Brother   . Diabetes Mellitus I Sister   . Diabetes Mellitus I Sister     ALLERGIES:  is allergic to cephalexin; doxycycline; levofloxacin; prilosec [omeprazole]; and singulair [montelukast].  MEDICATIONS:  Current Outpatient Medications  Medication Sig Dispense Refill  . acetaminophen (TYLENOL) 500 MG tablet Take 500 mg by mouth every 8 (eight) hours as needed for mild pain or moderate pain.     Marland Kitchen azithromycin (ZITHROMAX Z-PAK) 250 MG tablet Take 2 tablets by mouth on day one, then take 1 tablet daily for 4 days. 6 each 0  . dexamethasone (DECADRON) 4 MG tablet Take 2 tablets by mouth once a day on the day after chemotherapy and then take 2 tablets two times a day for 2 days. Take with food. 30 tablet 1  . lidocaine-prilocaine (EMLA) cream Apply to affected area once (Patient taking differently: Apply 1 application topically daily as needed. Port) 30 g 3  . LORazepam (ATIVAN) 0.5 MG tablet Take 1 tablet (0.5 mg total) by mouth every 8 (eight) hours as needed for anxiety (nausea). 30 tablet 0  . ondansetron (ZOFRAN) 8 MG tablet Take 1 tablet (8 mg total) by mouth 2 (two) times daily as needed. Start on the third day after chemotherapy. 30 tablet 1  . polyethylene glycol (MIRALAX / GLYCOLAX) packet Take 17 g by mouth daily.    . prochlorperazine (COMPAZINE) 10 MG tablet Take 1 tablet (10 mg total) by  mouth every 6 (six) hours as needed (Nausea or vomiting). 30 tablet 1  . promethazine (PHENERGAN) 25 MG suppository Place 1 suppository (25 mg total) rectally every 6 (six) hours as needed for nausea or vomiting. 12 suppository 1  . triamcinolone (NASACORT AQ) 55 MCG/ACT AERO nasal inhaler Place 2 sprays into the nose daily as needed (allergies).      No current facility-administered medications for this visit.     REVIEW OF SYSTEMS:    A 10+ POINT REVIEW OF SYSTEMS WAS OBTAINED including neurology, dermatology, psychiatry, cardiac, respiratory, lymph, extremities, GI, GU, Musculoskeletal, constitutional, breasts, reproductive, HEENT.  All pertinent positives are noted in the HPI.  All others are negative.   PHYSICAL EXAMINATION: ECOG PERFORMANCE STATUS: 1 BP 129/81 (BP Location: Left Arm, Patient Position: Sitting)   Pulse 87   Temp 98.4 F (36.9 C) (Oral)   Resp 18   Ht 5\' 8"  (1.727 m)   Wt 186 lb 6.4 oz (84.6 kg)   SpO2 100%   BMI 28.34 kg/m   GENERAL:alert, in no acute distress and comfortable SKIN: fleshy, reddish-brown, plaque-like region on outer aspect of right leg EYES: conjunctiva are pink and non-injected, sclera anicteric OROPHARYNX: MMM, no exudates, no oropharyngeal erythema or ulceration NECK: supple, no JVD LYMPH:  no palpable lymphadenopathy in the cervical, axillary or inguinal regions LUNGS: clear to auscultation b/l with normal respiratory effort HEART: regular rate & rhythm ABDOMEN:  normoactive bowel sounds , non tender, not distended. No palpable hepatosplenomegaly.  Extremity: no pedal edema PSYCH: alert & oriented x 3 with fluent speech  NEURO: no focal motor/sensory deficits   LABORATORY DATA:  I have reviewed the data as listed  . CBC Latest Ref Rng & Units 12/29/2017 12/16/2017 12/02/2017  WBC 3.9 - 10.3 K/uL 3.8(L) 4.5 5.7  Hemoglobin 11.6 - 15.9 g/dL 10.2(L) 10.1(L) 9.8(L)  Hematocrit 34.8 - 46.6 % 32.6(L) 30.8(L) 31.5(L)  Platelets 145 - 400  K/uL 334 377 386  ANC 8.3  . CMP Latest  Ref Rng & Units 12/29/2017 12/16/2017 12/02/2017  Glucose 70 - 99 mg/dL 102(H) 93 100  BUN 8 - 23 mg/dL 8 9 8   Creatinine 0.44 - 1.00 mg/dL 0.71 0.73 0.71  Sodium 135 - 145 mmol/L 141 139 138  Potassium 3.5 - 5.1 mmol/L 4.5 3.8 4.1  Chloride 98 - 111 mmol/L 107 107 106  CO2 22 - 32 mmol/L 26 23 23   Calcium 8.9 - 10.3 mg/dL 9.9 9.6 9.7  Total Protein 6.5 - 8.1 g/dL 6.6 6.6 6.7  Total Bilirubin 0.3 - 1.2 mg/dL <0.2(L) <0.2(L) <0.2(L)  Alkaline Phos 38 - 126 U/L 69 66 83  AST 15 - 41 U/L 15 18 15   ALT 0 - 44 U/L 16 11 15      Component     Latest Ref Rng & Units 08/29/2017  Retic Ct Pct     0.7 - 2.1 % 0.8  RBC.     3.70 - 5.45 MIL/uL 4.60  Retic Count, Absolute     33.7 - 90.7 K/uL 36.8  Sed Rate     0 - 22 mm/hr 30 (H)  LDH     125 - 245 U/L 179  HCV Ab     0.0 - 0.9 s/co ratio <0.1  Hepatitis B Surface Ag     Negative Negative  Hep B Core Ab, Tot     Negative Negative     08/12/17 Lymph Node Needle/core Biopsy:   08/12/17 Tissue Flow Cytometry:    09/22/17 Tissue Flow Cytometry    09/22/17 Lymph Node Pathology:   RADIOGRAPHIC STUDIES: I have personally reviewed the radiological images as listed and agreed with the findings in the report. No results found.  ASSESSMENT & PLAN:   72 y.o. female with  1. Stage IA Hodgkin's Lymphoma (mixed cellularity) No constitutional symptoms Sed rate elevated to 30 PET findings most consistent with Stage I disease; discussed that a small hypermetabolic nodule found in her liver has remain unchanged for 4 years and is not considered to be involved with her Hodgkin's lymphoma.  -Left cervical lymph nodes are involved without further involvement.  PLAN  -Pt is okay to take tylenol for bone pains and she will let us know if she gets too uncomfortable after growth factors.  -Recommended that while she receives chemotherapy, to eat popsicles and ice chips to reduce mouth sores, and using  salt and baking soda rinse.  -Previously discussed 3 cycles of AVD followed by either observation or involved-site radiation.  -She is fine to continue ativan as needed for her occasional vertigo.  -Have placed referral at [facility] in [facility] to evaluate leg lesion -Continue using salt/baking soda mouthwashes  -Continue optimizing nutritional status and hydration, and walking 20-30 minutes each day -Discussed pt labwork today, 12/29/17; ANC at 1.3k, blood counts are otherwise stable -Pt again expressed strong desire to have 3 Granix injections as opposed to 4 and will abide by protection prevention strategies.  -The pt has no prohibitive toxicities from finishing her planned third cycle of AVD at this time.   -Will see pt back after her PET/CT which is scheduled for 01/15/18    F/u for last planned AVD C3D15 tomorrow. RTC in 3 weeks with Dr Irene Limbo with labs    All of the patients questions were answered with apparent satisfaction. The patient knows to call the clinic with any problems, questions or concerns.  The total time spent in the appt was 25 minutes and more than 50%  was on counseling and direct patient cares.    Sullivan Lone MD MS AAHIVMS [facility] Hematology/Oncology Physician [facility]  (Office):       [phone] (Work cell):  [phone] (Fax):           [phone]  12/29/2017 9:19 AM  I, Baldwin Jamaica, am acting as a Education administrator for Dr Irene Limbo.   .I have reviewed the above documentation for accuracy and completeness, and I agree with the above. Brunetta Genera MD

## 2017-12-29 ENCOUNTER — Other Ambulatory Visit: Payer: Medicare HMO

## 2017-12-29 ENCOUNTER — Inpatient Hospital Stay: Payer: Medicare HMO | Attending: Hematology

## 2017-12-29 ENCOUNTER — Inpatient Hospital Stay: Payer: Medicare HMO | Admitting: Hematology

## 2017-12-29 VITALS — BP 129/81 | HR 87 | Temp 98.4°F | Resp 18 | Ht 68.0 in | Wt 186.4 lb

## 2017-12-29 DIAGNOSIS — Z87891 Personal history of nicotine dependence: Secondary | ICD-10-CM | POA: Insufficient documentation

## 2017-12-29 DIAGNOSIS — C8121 Mixed cellularity classical Hodgkin lymphoma, lymph nodes of head, face, and neck: Secondary | ICD-10-CM

## 2017-12-29 DIAGNOSIS — Z5111 Encounter for antineoplastic chemotherapy: Secondary | ICD-10-CM | POA: Insufficient documentation

## 2017-12-29 DIAGNOSIS — Z5189 Encounter for other specified aftercare: Secondary | ICD-10-CM | POA: Diagnosis not present

## 2017-12-29 DIAGNOSIS — C8198 Hodgkin lymphoma, unspecified, lymph nodes of multiple sites: Secondary | ICD-10-CM

## 2017-12-29 DIAGNOSIS — Z95828 Presence of other vascular implants and grafts: Secondary | ICD-10-CM

## 2017-12-29 DIAGNOSIS — D702 Other drug-induced agranulocytosis: Secondary | ICD-10-CM

## 2017-12-29 DIAGNOSIS — K769 Liver disease, unspecified: Secondary | ICD-10-CM | POA: Insufficient documentation

## 2017-12-29 LAB — CBC WITH DIFFERENTIAL/PLATELET
BASOS ABS: 0.1 10*3/uL (ref 0.0–0.1)
Basophils Relative: 2 %
Eosinophils Absolute: 0.1 10*3/uL (ref 0.0–0.5)
Eosinophils Relative: 2 %
HEMATOCRIT: 32.6 % — AB (ref 34.8–46.6)
HEMOGLOBIN: 10.2 g/dL — AB (ref 11.6–15.9)
LYMPHS ABS: 1.6 10*3/uL (ref 0.9–3.3)
Lymphocytes Relative: 42 %
MCH: 27.9 pg (ref 25.1–34.0)
MCHC: 31.3 g/dL — ABNORMAL LOW (ref 31.5–36.0)
MCV: 89.3 fL (ref 79.5–101.0)
Monocytes Absolute: 0.8 10*3/uL (ref 0.1–0.9)
Monocytes Relative: 21 %
NEUTROS ABS: 1.3 10*3/uL — AB (ref 1.5–6.5)
NEUTROS PCT: 33 %
PLATELETS: 334 10*3/uL (ref 145–400)
RBC: 3.65 MIL/uL — ABNORMAL LOW (ref 3.70–5.45)
RDW: 18.4 % — ABNORMAL HIGH (ref 11.2–14.5)
WBC: 3.8 10*3/uL — ABNORMAL LOW (ref 3.9–10.3)

## 2017-12-29 LAB — CMP (CANCER CENTER ONLY)
ALBUMIN: 3.7 g/dL (ref 3.5–5.0)
ALT: 16 U/L (ref 0–44)
AST: 15 U/L (ref 15–41)
Alkaline Phosphatase: 69 U/L (ref 38–126)
Anion gap: 8 (ref 5–15)
BUN: 8 mg/dL (ref 8–23)
CHLORIDE: 107 mmol/L (ref 98–111)
CO2: 26 mmol/L (ref 22–32)
CREATININE: 0.71 mg/dL (ref 0.44–1.00)
Calcium: 9.9 mg/dL (ref 8.9–10.3)
GFR, Est AFR Am: >60 mL/min (ref >60)
GLUCOSE: 102 mg/dL — AB (ref 70–99)
Potassium: 4.5 mmol/L (ref 3.5–5.1)
Sodium: 141 mmol/L (ref 135–145)
Total Bilirubin: <0.2 mg/dL — ABNORMAL LOW (ref 0.3–1.2)
Total Protein: 6.6 g/dL (ref 6.5–8.1)

## 2017-12-29 NOTE — Progress Notes (Signed)
Per Dr. Irene Limbo, ok to treat on 12/30/2017 with an [place] of 1.3

## 2017-12-30 ENCOUNTER — Inpatient Hospital Stay: Payer: Medicare HMO

## 2017-12-30 VITALS — BP 123/70 | HR 86 | Temp 98.5°F | Resp 17

## 2017-12-30 DIAGNOSIS — Z5111 Encounter for antineoplastic chemotherapy: Secondary | ICD-10-CM | POA: Diagnosis not present

## 2017-12-30 DIAGNOSIS — C8121 Mixed cellularity classical Hodgkin lymphoma, lymph nodes of head, face, and neck: Secondary | ICD-10-CM

## 2017-12-30 DIAGNOSIS — Z7189 Other specified counseling: Secondary | ICD-10-CM

## 2017-12-30 MED ORDER — SODIUM CHLORIDE 0.9% FLUSH
10.0000 mL | INTRAVENOUS | Status: DC | PRN
  Administered 2017-12-30: 10 mL
  Filled 2017-12-30: qty 10

## 2017-12-30 MED ORDER — SODIUM CHLORIDE 0.9 % IV SOLN
375.0000 mg/m2 | Freq: Once | INTRAVENOUS | Status: AC
  Administered 2017-12-30: 800 mg via INTRAVENOUS
  Filled 2017-12-30: qty 80

## 2017-12-30 MED ORDER — PALONOSETRON HCL INJECTION 0.25 MG/5ML
INTRAVENOUS | Status: AC
  Filled 2017-12-30: qty 5

## 2017-12-30 MED ORDER — FOSAPREPITANT DIMEGLUMINE INJECTION 150 MG
Freq: Once | INTRAVENOUS | Status: AC
  Administered 2017-12-30: 10:00:00 via INTRAVENOUS
  Filled 2017-12-30: qty 5

## 2017-12-30 MED ORDER — VINBLASTINE SULFATE CHEMO INJECTION 1 MG/ML
6.1000 mg/m2 | Freq: Once | INTRAVENOUS | Status: AC
  Administered 2017-12-30: 13 mg via INTRAVENOUS
  Filled 2017-12-30: qty 13

## 2017-12-30 MED ORDER — HEPARIN SOD (PORK) LOCK FLUSH 100 UNIT/ML IV SOLN
500.0000 [IU] | Freq: Once | INTRAVENOUS | Status: AC | PRN
  Administered 2017-12-30: 500 [IU]
  Filled 2017-12-30: qty 5

## 2017-12-30 MED ORDER — SODIUM CHLORIDE 0.9 % IV SOLN
Freq: Once | INTRAVENOUS | Status: AC
  Administered 2017-12-30: 09:00:00 via INTRAVENOUS

## 2017-12-30 MED ORDER — PALONOSETRON HCL INJECTION 0.25 MG/5ML
0.2500 mg | Freq: Once | INTRAVENOUS | Status: AC
  Administered 2017-12-30: 0.25 mg via INTRAVENOUS

## 2017-12-30 MED ORDER — DOXORUBICIN HCL CHEMO IV INJECTION 2 MG/ML
25.0000 mg/m2 | Freq: Once | INTRAVENOUS | Status: AC
  Administered 2017-12-30: 54 mg via INTRAVENOUS
  Filled 2017-12-30: qty 27

## 2017-12-30 NOTE — Patient Instructions (Signed)
[facility] Discharge Instructions for Patients Receiving Chemotherapy  Today you received the following chemotherapy agents: Adriamycin, Vinblastine, and Dacarbazine.  To help prevent nausea and vomiting after your treatment, we encourage you to take your nausea medication as directed.   If you develop nausea and vomiting that is not controlled by your nausea medication, call the clinic.   BELOW ARE SYMPTOMS THAT SHOULD BE REPORTED IMMEDIATELY:  *FEVER GREATER THAN 100.5 F  *CHILLS WITH OR WITHOUT FEVER  NAUSEA AND VOMITING THAT IS NOT CONTROLLED WITH YOUR NAUSEA MEDICATION  *UNUSUAL SHORTNESS OF BREATH  *UNUSUAL BRUISING OR BLEEDING  TENDERNESS IN MOUTH AND THROAT WITH OR WITHOUT PRESENCE OF ULCERS  *URINARY PROBLEMS  *BOWEL PROBLEMS  UNUSUAL RASH Items with * indicate a potential emergency and should be followed up as soon as possible.  Feel free to call the clinic should you have any questions or concerns. The clinic phone number is [phone].  Please show the CHEMO ALERT CARD at check-in to the Emergency Department and triage nurse.   

## 2017-12-31 ENCOUNTER — Inpatient Hospital Stay: Payer: Medicare HMO

## 2017-12-31 VITALS — BP 126/69 | HR 75 | Temp 98.3°F | Resp 18

## 2017-12-31 DIAGNOSIS — C8121 Mixed cellularity classical Hodgkin lymphoma, lymph nodes of head, face, and neck: Secondary | ICD-10-CM

## 2017-12-31 DIAGNOSIS — Z5111 Encounter for antineoplastic chemotherapy: Secondary | ICD-10-CM | POA: Diagnosis not present

## 2017-12-31 DIAGNOSIS — Z7189 Other specified counseling: Secondary | ICD-10-CM

## 2017-12-31 MED ORDER — TBO-FILGRASTIM 480 MCG/0.8ML ~~LOC~~ SOSY
PREFILLED_SYRINGE | SUBCUTANEOUS | Status: AC
  Filled 2017-12-31: qty 0.8

## 2017-12-31 MED ORDER — TBO-FILGRASTIM 480 MCG/0.8ML ~~LOC~~ SOSY
480.0000 ug | PREFILLED_SYRINGE | Freq: Once | SUBCUTANEOUS | Status: AC
  Administered 2017-12-31: 480 ug via SUBCUTANEOUS

## 2017-12-31 NOTE — Patient Instructions (Signed)
Tbo-Filgrastim injection What is this medicine? TBO-FILGRASTIM (T B O fil GRA stim) is a granulocyte colony-stimulating factor that stimulates the growth of neutrophils, a type of white blood cell important in the body's fight against infection. It is used to reduce the incidence of fever and infection in patients with certain types of cancer who are receiving chemotherapy that affects the bone marrow. This medicine may be used for other purposes; ask your health care provider or pharmacist if you have questions. COMMON BRAND NAME(S): Granix What should I tell my health care provider before I take this medicine? They need to know if you have any of these conditions: -bone scan or tests planned -kidney disease -sickle cell anemia -an unusual or allergic reaction to tbo-filgrastim, filgrastim, pegfilgrastim, other medicines, foods, dyes, or preservatives -pregnant or trying to get pregnant -breast-feeding How should I use this medicine? This medicine is for injection under the skin. If you get this medicine at home, you will be taught how to prepare and give this medicine. Refer to the Instructions for Use that come with your medication packaging. Use exactly as directed. Take your medicine at regular intervals. Do not take your medicine more often than directed. It is important that you put your used needles and syringes in a special sharps container. Do not put them in a trash can. If you do not have a sharps container, call your pharmacist or healthcare provider to get one. Talk to your pediatrician regarding the use of this medicine in children. Special care may be needed. Overdosage: If you think you have taken too much of this medicine contact a poison control center or emergency room at once. NOTE: This medicine is only for you. Do not share this medicine with others. What if I miss a dose? It is important not to miss your dose. Call your doctor or health care professional if you miss a  dose. What may interact with this medicine? This medicine may interact with the following medications: -medicines that may cause a release of neutrophils, such as lithium This list may not describe all possible interactions. Give your health care provider a list of all the medicines, herbs, non-prescription drugs, or dietary supplements you use. Also tell them if you smoke, drink alcohol, or use illegal drugs. Some items may interact with your medicine. What should I watch for while using this medicine? You may need blood work done while you are taking this medicine. What side effects may I notice from receiving this medicine? Side effects that you should report to your doctor or health care professional as soon as possible: -allergic reactions like skin rash, itching or hives, swelling of the face, lips, or tongue -blood in the urine -dark urine -dizziness -fast heartbeat -feeling faint -shortness of breath or breathing problems -signs and symptoms of infection like fever or chills; cough; or sore throat -signs and symptoms of kidney injury like trouble passing urine or change in the amount of urine -stomach or side pain, or pain at the shoulder -sweating -swelling of the legs, ankles, or abdomen -tiredness Side effects that usually do not require medical attention (report to your doctor or health care professional if they continue or are bothersome): -bone pain -headache -muscle pain -vomiting This list may not describe all possible side effects. Call your doctor for medical advice about side effects. You may report side effects to FDA at 1-800-FDA-1088. Where should I keep my medicine? Keep out of the reach of children. Store in a refrigerator between   2 and 8 degrees C (36 and 46 degrees F). Keep in carton to protect from light. Throw away this medicine if it is left out of the refrigerator for more than 5 consecutive days. Throw away any unused medicine after the expiration  date. NOTE: This sheet is a summary. It may not cover all possible information. If you have questions about this medicine, talk to your doctor, pharmacist, or health care provider.  2018 Elsevier/Gold Standard (2015-07-31 19:07:04)  

## 2018-01-01 ENCOUNTER — Inpatient Hospital Stay: Payer: Medicare HMO

## 2018-01-01 DIAGNOSIS — Z5111 Encounter for antineoplastic chemotherapy: Secondary | ICD-10-CM | POA: Diagnosis not present

## 2018-01-01 DIAGNOSIS — Z7189 Other specified counseling: Secondary | ICD-10-CM

## 2018-01-01 DIAGNOSIS — C8121 Mixed cellularity classical Hodgkin lymphoma, lymph nodes of head, face, and neck: Secondary | ICD-10-CM

## 2018-01-01 MED ORDER — TBO-FILGRASTIM 480 MCG/0.8ML ~~LOC~~ SOSY
PREFILLED_SYRINGE | SUBCUTANEOUS | Status: AC
  Filled 2018-01-01: qty 0.8

## 2018-01-01 MED ORDER — TBO-FILGRASTIM 480 MCG/0.8ML ~~LOC~~ SOSY
480.0000 ug | PREFILLED_SYRINGE | Freq: Once | SUBCUTANEOUS | Status: AC
  Administered 2018-01-01: 480 ug via SUBCUTANEOUS

## 2018-01-01 NOTE — Patient Instructions (Signed)
Tbo-Filgrastim injection What is this medicine? TBO-FILGRASTIM (T B O fil GRA stim) is a granulocyte colony-stimulating factor that stimulates the growth of neutrophils, a type of white blood cell important in the body's fight against infection. It is used to reduce the incidence of fever and infection in patients with certain types of cancer who are receiving chemotherapy that affects the bone marrow. This medicine may be used for other purposes; ask your health care provider or pharmacist if you have questions. COMMON BRAND NAME(S): Granix What should I tell my health care provider before I take this medicine? They need to know if you have any of these conditions: -bone scan or tests planned -kidney disease -sickle cell anemia -an unusual or allergic reaction to tbo-filgrastim, filgrastim, pegfilgrastim, other medicines, foods, dyes, or preservatives -pregnant or trying to get pregnant -breast-feeding How should I use this medicine? This medicine is for injection under the skin. If you get this medicine at home, you will be taught how to prepare and give this medicine. Refer to the Instructions for Use that come with your medication packaging. Use exactly as directed. Take your medicine at regular intervals. Do not take your medicine more often than directed. It is important that you put your used needles and syringes in a special sharps container. Do not put them in a trash can. If you do not have a sharps container, call your pharmacist or healthcare provider to get one. Talk to your pediatrician regarding the use of this medicine in children. Special care may be needed. Overdosage: If you think you have taken too much of this medicine contact a poison control center or emergency room at once. NOTE: This medicine is only for you. Do not share this medicine with others. What if I miss a dose? It is important not to miss your dose. Call your doctor or health care professional if you miss a  dose. What may interact with this medicine? This medicine may interact with the following medications: -medicines that may cause a release of neutrophils, such as lithium This list may not describe all possible interactions. Give your health care provider a list of all the medicines, herbs, non-prescription drugs, or dietary supplements you use. Also tell them if you smoke, drink alcohol, or use illegal drugs. Some items may interact with your medicine. What should I watch for while using this medicine? You may need blood work done while you are taking this medicine. What side effects may I notice from receiving this medicine? Side effects that you should report to your doctor or health care professional as soon as possible: -allergic reactions like skin rash, itching or hives, swelling of the face, lips, or tongue -blood in the urine -dark urine -dizziness -fast heartbeat -feeling faint -shortness of breath or breathing problems -signs and symptoms of infection like fever or chills; cough; or sore throat -signs and symptoms of kidney injury like trouble passing urine or change in the amount of urine -stomach or side pain, or pain at the shoulder -sweating -swelling of the legs, ankles, or abdomen -tiredness Side effects that usually do not require medical attention (report to your doctor or health care professional if they continue or are bothersome): -bone pain -headache -muscle pain -vomiting This list may not describe all possible side effects. Call your doctor for medical advice about side effects. You may report side effects to FDA at 1-800-FDA-1088. Where should I keep my medicine? Keep out of the reach of children. Store in a refrigerator between   2 and 8 degrees C (36 and 46 degrees F). Keep in carton to protect from light. Throw away this medicine if it is left out of the refrigerator for more than 5 consecutive days. Throw away any unused medicine after the expiration  date. NOTE: This sheet is a summary. It may not cover all possible information. If you have questions about this medicine, talk to your doctor, pharmacist, or health care provider.  2018 Elsevier/Gold Standard (2015-07-31 19:07:04)  

## 2018-01-02 ENCOUNTER — Inpatient Hospital Stay: Payer: Medicare HMO

## 2018-01-02 DIAGNOSIS — C8121 Mixed cellularity classical Hodgkin lymphoma, lymph nodes of head, face, and neck: Secondary | ICD-10-CM

## 2018-01-02 DIAGNOSIS — Z7189 Other specified counseling: Secondary | ICD-10-CM

## 2018-01-02 DIAGNOSIS — Z5111 Encounter for antineoplastic chemotherapy: Secondary | ICD-10-CM | POA: Diagnosis not present

## 2018-01-02 MED ORDER — TBO-FILGRASTIM 480 MCG/0.8ML ~~LOC~~ SOSY
PREFILLED_SYRINGE | SUBCUTANEOUS | Status: AC
  Filled 2018-01-02: qty 0.8

## 2018-01-02 MED ORDER — TBO-FILGRASTIM 480 MCG/0.8ML ~~LOC~~ SOSY
480.0000 ug | PREFILLED_SYRINGE | Freq: Once | SUBCUTANEOUS | Status: AC
  Administered 2018-01-02: 480 ug via SUBCUTANEOUS

## 2018-01-15 ENCOUNTER — Ambulatory Visit (HOSPITAL_COMMUNITY)
Admission: RE | Admit: 2018-01-15 | Discharge: 2018-01-15 | Disposition: A | Discharge status: Home / Self Care | Payer: Medicare HMO | Source: Ambulatory Visit | Attending: Hematology | Admitting: Hematology

## 2018-01-15 DIAGNOSIS — C8121 Mixed cellularity classical Hodgkin lymphoma, lymph nodes of head, face, and neck: Secondary | ICD-10-CM | POA: Diagnosis present

## 2018-01-15 DIAGNOSIS — Z79899 Other long term (current) drug therapy: Secondary | ICD-10-CM | POA: Insufficient documentation

## 2018-01-15 DIAGNOSIS — I712 Thoracic aortic aneurysm, without rupture: Secondary | ICD-10-CM | POA: Insufficient documentation

## 2018-01-15 DIAGNOSIS — M799 Soft tissue disorder, unspecified: Secondary | ICD-10-CM | POA: Diagnosis not present

## 2018-01-15 DIAGNOSIS — C8190 Hodgkin lymphoma, unspecified, unspecified site: Secondary | ICD-10-CM | POA: Diagnosis not present

## 2018-01-15 LAB — GLUCOSE, CAPILLARY: Glucose-Capillary: 106 mg/dL — ABNORMAL HIGH (ref 70–99)

## 2018-01-15 MED ORDER — FLUDEOXYGLUCOSE F - 18 (FDG) INJECTION
9.2200 | Freq: Once | INTRAVENOUS | Status: AC
  Administered 2018-01-15: 9.22 via INTRAVENOUS

## 2018-01-19 NOTE — Progress Notes (Signed)
HEMATOLOGY/ONCOLOGY CLINIC NOTE  Date of Service: 01/20/18    Patient Care Team: Kathyrn Lass, MD as PCP - General (Family Medicine)  CHIEF COMPLAINTS/PURPOSE OF CONSULTATION:  F/u for continued mx of  Hodgkin's Lymphoma   HISTORY OF PRESENTING ILLNESS:   Jennifer Juarez is a wonderful 72 y.o. female who has been referred to [place] by Dr. Lattie Haw Miller/Courtney Rolland Porter PA_C  for evaluation and management of likely newly diagnosed Hodgkin's Lymphoma.   The pt reports that she is doing very well overall and notes that she is fully functional and does not have any significant chronic medical problems or any functional limitations.  She notes that on 06/26/17 she noticed a knot on the left side of her neck that subsequently precipitated her CT scan and Bx prior to seeing [place] today.   On 07/28/17 the pt had a CT Soft Tissue Neck revealing Enlarged lymph nodes in the left neck compatible with neoplasm.Enlarged lymph nodes in the left neck. Large left level 2 lymph node 23 x 30 mm. Multiple posterior lymph nodes are present measuring up to 10 mm. No enlarged lymph nodes in the right neck. Biopsy recommended. Attention left tonsil on direct mucosal inspection. Possible tonsillar carcinoma on the left. Lymphoma also in the differential.    Of note prior to the patient's visit, pt has had a Lymph node Needle/Core biopsy completed on 08/12/17 with results revealing ATYPICAL LYMPHOID PROLIFERATION. Immunohistochemical stains were performed including LCA, CD20, PAX-5, CD3, CD15, and CD30 with appropriate controls. The large atypical lymphoid-appearing cells are positive for CD30, CD15 and PAX-5 and negative for CD3, CD20 and LCA. The lymphocytic population in the background show a mixture of T and B-cells with predominance of T-cells. The overall findings are limited but atypical and worrisome for a lymphoproliferative process, particularly. classical Hodgkin lymphoma. Excisional biopsy is strongly  recommended.  Also on 08/12/17 the pt had a Tissue Flow Cytometry revealing NO MONOCLONAL B-CELL POPULATION OR ABNORMAL T-CELL PHENOTYPE IDENTIFIED.   Most recent CBC with Diff. results (07/15/17) revealed all values WNL.   She notes that she has had a viral infection in 2005 that resulted in a complete loss of hearing in her right ear. She notes that her left ear has had fluid build up and has resulted in significant loss of hearing, for which she uses hearing aids.  On review of systems, pt reports good energy levels, and denies fevers, chills, night sweats, unexpected weight loss, skin itching, rashes, soreness, sore throat, difficulties swallowing, back pains, abdominal pains, leg swelling, and any other symptoms.   On PMHx the pt reports arthritis, vertigo, polyps. She denies heart, lung, kidney or liver problems. She denies DM.  On Social Hx the pt notes having quit smoking when she was 23. She reports infrequent EOH consumption. She denies chemical or radiation exposure.  On Family Hx she reports that her mother had liver cancer. Her sister had breast cancer at 32 y/o and pancreatic cancer at 74 y/o.  Interval History:   Jennifer Juarez is here for a scheduled follow-up of her classical Hodgkin's Lymphoma. The patient's last visit with [place] was on 12/29/17. She is accompanied today by her nephew and a friend. The pt reports that she is doing well overall.   The pt reports that she has been using Boost as a meal replacement, and has had a weaker appetite in general. She notes that her energy levels have been fine overall.   The pt notes that she received both  pneumonia vaccinations, and will double check how recently these were received. She notes that after receiving Prevnar, she developed fevers and substantial body aches in the days that followed. The pt notes that she hasn't seen her PCP in over a year, but she continues with her annual flu shots.   Of note since the patient's last visit, pt has had  PET/CT completed on 01/15/18 with results revealing Interval complete response of left cervical hypermetabolic lymphadenopathy, now Deauville 2. 2. Stable 1.9 cm hypermetabolic retroperitoneal soft tissue nodule in the posterior right upper quadrant. Indeterminate. 3. Tiny left groin lymph nodes are unchanged in size since prior PET-CT and 1 of these demonstrates a clear central fatty hilum. Both are FDG avid on today's exam (not seen previously). Given response of cervical disease, these features may reflect reactive etiology and attention on follow-up suggested. 4. Stable fusiform descending thoracic aortic aneurysm. Follow-up CT in 1 year could be used to reassess.  Lab results today (01/20/18) of CBC w/diff, CMP, and Reticulocytes is as follows: all values are WNL except for HGB at 10.2, HCT at 33.0, MCHC at 30.9, RDW at 18.2, PLT at 428k, Monocytes abs at 1.6k, Albumin at 3.3.   On review of systems, pt reports weak appetite, good energy levels, moving her bowels, and denies pain along the spine, problems with her port, fevers, chills, night sweats, and any other symptoms.   MEDICAL HISTORY:  Past Medical History:  Diagnosis Date  . BPPV (benign paroxysmal positional vertigo)   . Cancer ([place])   . H/O seasonal allergies   . Hearing loss in right ear   . Left cervical lymphadenopathy   . Macular degeneration   . Obesity   . Osteopenia   . Pre-syncope 11/09/2017  . Retinal vein occlusion   . Vitamin D deficiency     SURGICAL HISTORY: Past Surgical History:  Procedure Laterality Date  . CHOLECYSTECTOMY    . IR FLUORO GUIDE PORT INSERTION RIGHT  10/10/2017  . IR US GUIDE VASC ACCESS RIGHT  10/10/2017  . MASS EXCISION Left 09/22/2017   Procedure: LEFT CERVICAL LYMPH NODE EXCISION;  Surgeon: Izora Gala, MD;  Location: [facility];  Service: ENT;  Laterality: Left;  . TUBAL LIGATION      SOCIAL HISTORY: Social History   Socioeconomic History  . Marital status: Single     Spouse name: Not on file  . Number of children: Not on file  . Years of education: Not on file  . Highest education level: Not on file  Occupational History  . Not on file  Social Needs  . Financial resource strain: Not on file  . Food insecurity:    Worry: Not on file    Inability: Not on file  . Transportation needs:    Medical: Not on file    Non-medical: Not on file  Tobacco Use  . Smoking status: Former Smoker    Last attempt to quit: 06/24/1988    Years since quitting: 29.5  . Smokeless tobacco: Never Used  Substance and Sexual Activity  . Alcohol use: No  . Drug use: No  . Sexual activity: Not on file  Lifestyle  . Physical activity:    Days per week: Not on file    Minutes per session: Not on file  . Stress: Not on file  Relationships  . Social connections:    Talks on phone: Not on file    Gets together: Not on file    Attends religious  service: Not on file    Active member of club or organization: Not on file    Attends meetings of clubs or organizations: Not on file    Relationship status: Not on file  . Intimate partner violence:    Fear of current or ex partner: Not on file    Emotionally abused: Not on file    Physically abused: Not on file    Forced sexual activity: Not on file  Other Topics Concern  . Not on file  Social History Narrative  . Not on file    FAMILY HISTORY: Family History  Problem Relation Age of Onset  . Cancer Mother        liver  . Heart attack Father   . Hypertension Father   . Diabetes Mellitus I Father   . Cancer Sister        pancreatic, breast  . Diabetes Mellitus I Sister   . Heart disease Brother   . Heart disease Brother   . Heart disease Brother   . Diabetes Mellitus I Sister   . Diabetes Mellitus I Sister     ALLERGIES:  is allergic to cephalexin; doxycycline; levofloxacin; prilosec [omeprazole]; and singulair [montelukast].  MEDICATIONS:  Current Outpatient Medications  Medication Sig Dispense Refill  .  acetaminophen (TYLENOL) 500 MG tablet Take 500 mg by mouth every 8 (eight) hours as needed for mild pain or moderate pain.     Marland Kitchen azithromycin (ZITHROMAX Z-PAK) 250 MG tablet Take 2 tablets by mouth on day one, then take 1 tablet daily for 4 days. 6 each 0  . dexamethasone (DECADRON) 4 MG tablet Take 2 tablets by mouth once a day on the day after chemotherapy and then take 2 tablets two times a day for 2 days. Take with food. 30 tablet 1  . lidocaine-prilocaine (EMLA) cream Apply to affected area once (Patient taking differently: Apply 1 application topically daily as needed. Port) 30 g 3  . LORazepam (ATIVAN) 0.5 MG tablet Take 1 tablet (0.5 mg total) by mouth every 8 (eight) hours as needed for anxiety (nausea). 30 tablet 0  . ondansetron (ZOFRAN) 8 MG tablet Take 1 tablet (8 mg total) by mouth 2 (two) times daily as needed. Start on the third day after chemotherapy. 30 tablet 1  . polyethylene glycol (MIRALAX / GLYCOLAX) packet Take 17 g by mouth daily.    . prochlorperazine (COMPAZINE) 10 MG tablet Take 1 tablet (10 mg total) by mouth every 6 (six) hours as needed (Nausea or vomiting). 30 tablet 1  . promethazine (PHENERGAN) 25 MG suppository Place 1 suppository (25 mg total) rectally every 6 (six) hours as needed for nausea or vomiting. 12 suppository 1  . triamcinolone (NASACORT AQ) 55 MCG/ACT AERO nasal inhaler Place 2 sprays into the nose daily as needed (allergies).      No current facility-administered medications for this visit.     REVIEW OF SYSTEMS:    A 10+ POINT REVIEW OF SYSTEMS WAS OBTAINED including neurology, dermatology, psychiatry, cardiac, respiratory, lymph, extremities, GI, GU, Musculoskeletal, constitutional, breasts, reproductive, HEENT.  All pertinent positives are noted in the HPI.  All others are negative.   PHYSICAL EXAMINATION: ECOG PERFORMANCE STATUS: 1 BP 132/80 (BP Location: Left Arm, Patient Position: Sitting)   Pulse (!) 113 Comment: Notified Nurse of Pulse  Rate  Temp 98.3 F (36.8 C) (Oral)   Resp 18   Ht 5\' 8"  (1.727 m)   Wt 180 lb 1.6 oz (81.7 kg)  SpO2 100%   BMI 27.38 kg/m   GENERAL:alert, in no acute distress and comfortable SKIN: fleshy, reddish-brown plaque-like region on outer aspect of right leg  EYES: conjunctiva are pink and non-injected, sclera anicteric OROPHARYNX: MMM, no exudates, no oropharyngeal erythema or ulceration NECK: supple, no JVD LYMPH:  no palpable lymphadenopathy in the cervical, axillary or inguinal regions LUNGS: clear to auscultation b/l with normal respiratory effort HEART: regular rate & rhythm ABDOMEN:  normoactive bowel sounds , non tender, not distended. No palpable hepatosplenomegaly.  Extremity: no pedal edema PSYCH: alert & oriented x 3 with fluent speech NEURO: no focal motor/sensory deficits   LABORATORY DATA:  I have reviewed the data as listed  . CBC Latest Ref Rng & Units 01/20/2018 12/29/2017 12/16/2017  WBC 3.9 - 10.3 K/uL 7.9 3.8(L) 4.5  Hemoglobin 11.6 - 15.9 g/dL 10.2(L) 10.2(L) 10.1(L)  Hematocrit 34.8 - 46.6 % 33.0(L) 32.6(L) 30.8(L)  Platelets 145 - 400 K/uL 428(H) 334 377   . CMP Latest Ref Rng & Units 01/20/2018 12/29/2017 12/16/2017  Glucose 70 - 99 mg/dL 97 102(H) 93  BUN 8 - 23 mg/dL 9 8 9   Creatinine 0.44 - 1.00 mg/dL 0.74 0.71 0.73  Sodium 135 - 145 mmol/L 140 141 139  Potassium 3.5 - 5.1 mmol/L 3.9 4.5 3.8  Chloride 98 - 111 mmol/L 105 107 107  CO2 22 - 32 mmol/L 25 26 23   Calcium 8.9 - 10.3 mg/dL 9.9 9.9 9.6  Total Protein 6.5 - 8.1 g/dL 6.7 6.6 6.6  Total Bilirubin 0.3 - 1.2 mg/dL 0.3 <0.2(L) <0.2(L)  Alkaline Phos 38 - 126 U/L 83 69 66  AST 15 - 41 U/L 15 15 18   ALT 0 - 44 U/L 8 16 11      Component     Latest Ref Rng & Units 08/29/2017  Retic Ct Pct     0.7 - 2.1 % 0.8  RBC.     3.70 - 5.45 MIL/uL 4.60  Retic Count, Absolute     33.7 - 90.7 K/uL 36.8  Sed Rate     0 - 22 mm/hr 30 (H)  LDH     125 - 245 U/L 179  HCV Ab     0.0 - 0.9 s/co ratio <0.1    Hepatitis B Surface Ag     Negative Negative  Hep B Core Ab, Tot     Negative Negative     08/12/17 Lymph Node Needle/core Biopsy:   08/12/17 Tissue Flow Cytometry:    09/22/17 Tissue Flow Cytometry    09/22/17 Lymph Node Pathology:   RADIOGRAPHIC STUDIES: I have personally reviewed the radiological images as listed and agreed with the findings in the report. Nm Pet Image Restag (ps) Skull Base To Thigh  Result Date: 01/15/2018 CLINICAL DATA:  Subsequent treatment strategy for Hodgkin lymphoma. EXAM: NUCLEAR MEDICINE PET SKULL BASE TO THIGH TECHNIQUE: 9.2 mCi F-18 FDG was injected intravenously. Full-ring PET imaging was performed from the skull base to thigh after the radiotracer. CT data was obtained and used for attenuation correction and anatomic localization. Fasting blood glucose: 106 mg/dl COMPARISON:  09/17/2017 FINDINGS: Mediastinal blood pool activity: SUV max 2.5 NECK: The left-sided level II cervical lymphadenopathy seen on CT imaging of prior study has resolved. The index 2.3 cm lymph node on that exam now measures 0.5 cm short axis. PET imaging reveals resolution of hypermetabolism in these lymph nodes with SUV max = 2.1 today. Incidental CT findings: none CHEST: No hypermetabolic mediastinal or hilar nodes.  No suspicious pulmonary nodules on the CT scan. No change in the non FDG avid 8 mm right middle lobe pulmonary nodule. Incidental CT findings: Chronic underlying interstitial changes are seen in the lungs with basilar predominant compressive atelectasis. No pleural effusion. Aneurysmal dilation of the descending thoracic aorta is similar to prior measuring 4.5 cm diameter today. Right Port-A-Cath tip is positioned in the mid SVC. ABDOMEN/PELVIS: No abnormal hypermetabolic activity within the liver, pancreas, adrenal glands, or spleen. No hypermetabolic lymph nodes in the abdomen or pelvis. The 1.9 cm retroperitoneal nodule, just posterior to the upper pole of the right kidney  is stable in the interval with a central dystrophic calcification. This remains FDG avid with SUV max = 3.3 today compared to 4.9 previously. Tiny lymph nodes in the left groin are stable in size in the interval but show FDG uptake on today's study, a new finding. 5 mm short axis left groin lymph node (4:184) demonstrates SUV max = 3.3. 7 mm short axis left groin lymph node (4:192) shows a central fatty hilum and FDG uptake with SUV max = 3.6. Incidental CT findings: Stable 15 mm left adrenal nodule compatible with adenoma. Haziness in the central small bowel mesentery with scattered small mesenteric lymph nodes unchanged in the interval. There is abdominal aortic atherosclerosis. Small umbilical hernia contains only fat. Supraumbilical epigastric ventral hernia contains only fat with hernia sac measuring 2.2 x 4.1 cm. Diverticular changes are noted in the left colon. SKELETON: No focal hypermetabolic activity to suggest skeletal metastasis. Incidental CT findings: none IMPRESSION: 1. Interval complete response of left cervical hypermetabolic lymphadenopathy, now Deauville 2. 2. Stable 1.9 cm hypermetabolic retroperitoneal soft tissue nodule in the posterior right upper quadrant. Indeterminate. 3. Tiny left groin lymph nodes are unchanged in size since prior PET-CT and 1 of these demonstrates a clear central fatty hilum. Both are FDG avid on today's exam (not seen previously). Given response of cervical disease, these features may reflect reactive etiology and attention on follow-up suggested. 4. Stable fusiform descending thoracic aortic aneurysm. Follow-up CT in 1 year could be used to reassess. Electronically Signed   By: Misty Stanley M.D.   On: 01/15/2018 14:52    ASSESSMENT & PLAN:   71 y.o. female with  1. Stage IA Hodgkin's Lymphoma (mixed cellularity) No constitutional symptoms Sed rate elevated to 30 PET findings most consistent with Stage I disease; discussed that a small hypermetabolic nodule  found in her liver has remain unchanged for 4 years and is not considered to be involved with her Hodgkin's lymphoma.  -Left cervical lymph nodes are involved without further involvement.  PLAN -Discussed pt labwork today, 01/20/18; PLT increased to 428k, HGB stable at 10.2, blood counts and chemistries are stable overall -Pt has completed planned three cycles of AVD without any residual toxicities -Discussed the 01/15/18 PET/CT with the pt and her family members which revealed Interval complete response of left cervical hypermetabolic lymphadenopathy, now Deauville 2. Stable 1.9 cm hypermetabolic retroperitoneal soft tissue nodule in the posterior right upper quadrant. Indeterminate. Tiny left groin lymph nodes are unchanged in size since prior PET-CT and 1 of these demonstrates a clear central fatty hilum. Both are FDG avid on today's exam (not seen previously). Given response of cervical disease, these features may reflect reactive etiology and attention on follow-up suggested. Stable fusiform descending thoracic aortic aneurysm. Follow-up CT in 1 year could be used to reassess.  -Findings suggest complete response -Recommend continuing to watch pt, instead of pursuing involved site  radiation  -Will continue to watch the pt clinically and her lab work regularly -Provided survivorship guidelines -If the pt develops any new symptoms in the interim, she will let me know -Continue with annual flu shots -Recommend following up with PCP -Recommended that the pt optimize nutritional status, increase her activity levels, and stay well hydrated -Recommended using Boost as a meal supplement, not as a substitute -Offered to order port removal, pt would prefer to have this completed in a couple weeks -Will see pt back in 3 months unless she develops any new concerns before then     [facility] a cath removal in 2 weeks RTC with Dr Irene Limbo in 3 months with labs    All of the patients questions were answered with  apparent satisfaction. The patient knows to call the clinic with any problems, questions or concerns.  The total time spent in the appt was 25 minutes and more than 50% was on counseling and direct patient cares.    Sullivan Lone MD MS AAHIVMS [facility] Hematology/Oncology Physician [facility]  (Office):       [phone] (Work cell):  [phone] (Fax):           [phone]  01/20/2018 10:41 AM  I, Baldwin [place], am acting as a scribe for Dr. Irene Limbo  .I have reviewed the above documentation for accuracy and completeness, and I agree with the above. Brunetta Genera MD

## 2018-01-20 ENCOUNTER — Inpatient Hospital Stay: Payer: Medicare HMO

## 2018-01-20 ENCOUNTER — Inpatient Hospital Stay (HOSPITAL_BASED_OUTPATIENT_CLINIC_OR_DEPARTMENT_OTHER): Payer: Medicare HMO | Admitting: Hematology

## 2018-01-20 VITALS — BP 132/80 | HR 113 | Temp 98.3°F | Resp 18 | Ht 68.0 in | Wt 180.1 lb

## 2018-01-20 DIAGNOSIS — Z5111 Encounter for antineoplastic chemotherapy: Secondary | ICD-10-CM | POA: Diagnosis not present

## 2018-01-20 DIAGNOSIS — C8121 Mixed cellularity classical Hodgkin lymphoma, lymph nodes of head, face, and neck: Secondary | ICD-10-CM

## 2018-01-20 DIAGNOSIS — Z87891 Personal history of nicotine dependence: Secondary | ICD-10-CM

## 2018-01-20 DIAGNOSIS — K769 Liver disease, unspecified: Secondary | ICD-10-CM

## 2018-01-20 DIAGNOSIS — C8198 Hodgkin lymphoma, unspecified, lymph nodes of multiple sites: Secondary | ICD-10-CM

## 2018-01-20 LAB — CMP (CANCER CENTER ONLY)
ALBUMIN: 3.3 g/dL — AB (ref 3.5–5.0)
ALT: 8 U/L (ref 0–44)
AST: 15 U/L (ref 15–41)
Alkaline Phosphatase: 83 U/L (ref 38–126)
Anion gap: 10 (ref 5–15)
BILIRUBIN TOTAL: 0.3 mg/dL (ref 0.3–1.2)
BUN: 9 mg/dL (ref 8–23)
CHLORIDE: 105 mmol/L (ref 98–111)
CO2: 25 mmol/L (ref 22–32)
Calcium: 9.9 mg/dL (ref 8.9–10.3)
Creatinine: 0.74 mg/dL (ref 0.44–1.00)
GFR, Est AFR Am: >60 mL/min (ref >60)
GLUCOSE: 97 mg/dL (ref 70–99)
Potassium: 3.9 mmol/L (ref 3.5–5.1)
Sodium: 140 mmol/L (ref 135–145)
Total Protein: 6.7 g/dL (ref 6.5–8.1)

## 2018-01-20 LAB — CBC WITH DIFFERENTIAL/PLATELET
BASOS ABS: 0 10*3/uL (ref 0.0–0.1)
Basophils Relative: 1 %
Eosinophils Absolute: 0.2 10*3/uL (ref 0.0–0.5)
Eosinophils Relative: 3 %
HEMATOCRIT: 33 % — AB (ref 34.8–46.6)
Hemoglobin: 10.2 g/dL — ABNORMAL LOW (ref 11.6–15.9)
LYMPHS PCT: 13 %
Lymphs Abs: 1 10*3/uL (ref 0.9–3.3)
MCH: 27.5 pg (ref 25.1–34.0)
MCHC: 30.9 g/dL — ABNORMAL LOW (ref 31.5–36.0)
MCV: 88.9 fL (ref 79.5–101.0)
Monocytes Absolute: 1.6 10*3/uL — ABNORMAL HIGH (ref 0.1–0.9)
Monocytes Relative: 20 %
NEUTROS ABS: 5.1 10*3/uL (ref 1.5–6.5)
NEUTROS PCT: 63 %
Platelets: 428 10*3/uL — ABNORMAL HIGH (ref 145–400)
RBC: 3.71 MIL/uL (ref 3.70–5.45)
RDW: 18.2 % — ABNORMAL HIGH (ref 11.2–14.5)
WBC: 7.9 10*3/uL (ref 3.9–10.3)

## 2018-01-20 LAB — SEDIMENTATION RATE: SED RATE: 57 mm/h — AB (ref 0–22)

## 2018-01-23 DIAGNOSIS — L814 Other melanin hyperpigmentation: Secondary | ICD-10-CM | POA: Diagnosis not present

## 2018-01-23 DIAGNOSIS — D485 Neoplasm of uncertain behavior of skin: Secondary | ICD-10-CM | POA: Diagnosis not present

## 2018-02-03 DIAGNOSIS — L308 Other specified dermatitis: Secondary | ICD-10-CM | POA: Diagnosis not present

## 2018-02-04 ENCOUNTER — Other Ambulatory Visit: Payer: Self-pay | Admitting: Student

## 2018-02-05 ENCOUNTER — Ambulatory Visit (HOSPITAL_COMMUNITY)
Admission: RE | Admit: 2018-02-05 | Discharge: 2018-02-05 | Disposition: A | Discharge status: Home / Self Care | Payer: Medicare HMO | Source: Ambulatory Visit | Attending: Hematology | Admitting: Hematology

## 2018-02-05 ENCOUNTER — Other Ambulatory Visit: Payer: Self-pay

## 2018-02-05 ENCOUNTER — Encounter (HOSPITAL_COMMUNITY): Payer: Self-pay

## 2018-02-05 DIAGNOSIS — Z9049 Acquired absence of other specified parts of digestive tract: Secondary | ICD-10-CM | POA: Insufficient documentation

## 2018-02-05 DIAGNOSIS — Z888 Allergy status to other drugs, medicaments and biological substances status: Secondary | ICD-10-CM | POA: Diagnosis not present

## 2018-02-05 DIAGNOSIS — Z6828 Body mass index (BMI) 28.0-28.9, adult: Secondary | ICD-10-CM | POA: Insufficient documentation

## 2018-02-05 DIAGNOSIS — Z9851 Tubal ligation status: Secondary | ICD-10-CM | POA: Insufficient documentation

## 2018-02-05 DIAGNOSIS — H9191 Unspecified hearing loss, right ear: Secondary | ICD-10-CM | POA: Insufficient documentation

## 2018-02-05 DIAGNOSIS — E559 Vitamin D deficiency, unspecified: Secondary | ICD-10-CM | POA: Diagnosis not present

## 2018-02-05 DIAGNOSIS — Z803 Family history of malignant neoplasm of breast: Secondary | ICD-10-CM | POA: Insufficient documentation

## 2018-02-05 DIAGNOSIS — Z881 Allergy status to other antibiotic agents status: Secondary | ICD-10-CM | POA: Diagnosis not present

## 2018-02-05 DIAGNOSIS — C8121 Mixed cellularity classical Hodgkin lymphoma, lymph nodes of head, face, and neck: Secondary | ICD-10-CM | POA: Insufficient documentation

## 2018-02-05 DIAGNOSIS — Z800 Family history of malignant neoplasm of digestive organs: Secondary | ICD-10-CM | POA: Diagnosis not present

## 2018-02-05 DIAGNOSIS — H8110 Benign paroxysmal vertigo, unspecified ear: Secondary | ICD-10-CM | POA: Insufficient documentation

## 2018-02-05 DIAGNOSIS — H348192 Central retinal vein occlusion, unspecified eye, stable: Secondary | ICD-10-CM | POA: Insufficient documentation

## 2018-02-05 DIAGNOSIS — Z8572 Personal history of non-Hodgkin lymphomas: Secondary | ICD-10-CM | POA: Diagnosis not present

## 2018-02-05 DIAGNOSIS — H3530 Unspecified macular degeneration: Secondary | ICD-10-CM | POA: Diagnosis not present

## 2018-02-05 DIAGNOSIS — Z87891 Personal history of nicotine dependence: Secondary | ICD-10-CM | POA: Diagnosis not present

## 2018-02-05 DIAGNOSIS — Z79899 Other long term (current) drug therapy: Secondary | ICD-10-CM | POA: Insufficient documentation

## 2018-02-05 DIAGNOSIS — Z452 Encounter for adjustment and management of vascular access device: Secondary | ICD-10-CM | POA: Insufficient documentation

## 2018-02-05 DIAGNOSIS — Z9889 Other specified postprocedural states: Secondary | ICD-10-CM | POA: Diagnosis not present

## 2018-02-05 DIAGNOSIS — M8580 Other specified disorders of bone density and structure, unspecified site: Secondary | ICD-10-CM | POA: Insufficient documentation

## 2018-02-05 DIAGNOSIS — Z5111 Encounter for antineoplastic chemotherapy: Secondary | ICD-10-CM | POA: Diagnosis not present

## 2018-02-05 DIAGNOSIS — E669 Obesity, unspecified: Secondary | ICD-10-CM | POA: Diagnosis not present

## 2018-02-05 HISTORY — PX: IR REMOVAL TUN ACCESS W/ PORT W/O FL MOD SED: IMG2290

## 2018-02-05 LAB — CBC
HEMATOCRIT: 37.2 % (ref 36.0–46.0)
Hemoglobin: 11.6 g/dL — ABNORMAL LOW (ref 12.0–15.0)
MCH: 27.8 pg (ref 26.0–34.0)
MCHC: 31.2 g/dL (ref 30.0–36.0)
MCV: 89 fL (ref 78.0–100.0)
PLATELETS: 377 10*3/uL (ref 150–400)
RBC: 4.18 MIL/uL (ref 3.87–5.11)
RDW: 17.1 % — ABNORMAL HIGH (ref 11.5–15.5)
WBC: 7.1 10*3/uL (ref 4.0–10.5)

## 2018-02-05 LAB — APTT: aPTT: 26 seconds (ref 24–36)

## 2018-02-05 LAB — PROTIME-INR
INR: 0.9
Prothrombin Time: 12 seconds (ref 11.4–15.2)

## 2018-02-05 MED ORDER — LIDOCAINE-EPINEPHRINE (PF) 2 %-1:200000 IJ SOLN
INTRAMUSCULAR | Status: AC
  Filled 2018-02-05: qty 20

## 2018-02-05 MED ORDER — LIDOCAINE-EPINEPHRINE (PF) 2 %-1:200000 IJ SOLN
INTRAMUSCULAR | Status: AC | PRN
  Administered 2018-02-05: 10 mL

## 2018-02-05 MED ORDER — FENTANYL CITRATE (PF) 100 MCG/2ML IJ SOLN
INTRAMUSCULAR | Status: AC | PRN
  Administered 2018-02-05 (×2): 50 ug via INTRAVENOUS

## 2018-02-05 MED ORDER — FENTANYL CITRATE (PF) 100 MCG/2ML IJ SOLN
INTRAMUSCULAR | Status: AC
  Filled 2018-02-05: qty 2

## 2018-02-05 MED ORDER — VANCOMYCIN HCL IN DEXTROSE 1-5 GM/200ML-% IV SOLN
1000.0000 mg | INTRAVENOUS | Status: AC
  Administered 2018-02-05: 1000 mg via INTRAVENOUS

## 2018-02-05 MED ORDER — MIDAZOLAM HCL 2 MG/2ML IJ SOLN
INTRAMUSCULAR | Status: AC | PRN
  Administered 2018-02-05 (×2): 1 mg via INTRAVENOUS

## 2018-02-05 MED ORDER — SODIUM CHLORIDE 0.9 % IV SOLN
INTRAVENOUS | Status: DC
  Administered 2018-02-05: 11:00:00 via INTRAVENOUS

## 2018-02-05 MED ORDER — VANCOMYCIN HCL IN DEXTROSE 1-5 GM/200ML-% IV SOLN
INTRAVENOUS | Status: AC
  Filled 2018-02-05: qty 200

## 2018-02-05 MED ORDER — MIDAZOLAM HCL 2 MG/2ML IJ SOLN
INTRAMUSCULAR | Status: AC
  Filled 2018-02-05: qty 2

## 2018-02-05 NOTE — H&P (Signed)
Chief Complaint: Patient was seen in consultation today for Hodgkin's lymphoma.  Referring Physician(s): Brunetta Genera  Supervising Physician: Sandi Mariscal  Patient Status: [facility] - Out-pt  History of Present Illness: Jennifer Juarez is a 72 y.o. female with a past medical history of BPPV, seasonal allergies, retinal vein occlusion, macular degeneration, vitamin D deficiency, obesity, osteopenia, and Hodgkin's lymphoma. She is known to IR and has had multiple procedures with [place]. She had a Port-a-cath placed 10/10/2017 by Dr. Vernard Gambles. She completed three cycles of AVD and no longer needs her Port-a-cath.  IR requested by Dr. Irene Limbo for possible image-guided Port-a-cath removal. Patient awake and alert sitting in bed with no complaints at this time. Denies fever, chills, chest pain, dyspnea, abdominal pain, dizziness, or headache.   Past Medical History:  Diagnosis Date  . BPPV (benign paroxysmal positional vertigo)   . Cancer ([place])   . H/O seasonal allergies   . Hearing loss in right ear   . Left cervical lymphadenopathy   . Macular degeneration   . Obesity   . Osteopenia   . Pre-syncope 11/09/2017  . Retinal vein occlusion   . Vitamin D deficiency     Past Surgical History:  Procedure Laterality Date  . CHOLECYSTECTOMY    . IR FLUORO GUIDE PORT INSERTION RIGHT  10/10/2017  . IR US GUIDE VASC ACCESS RIGHT  10/10/2017  . MASS EXCISION Left 09/22/2017   Procedure: LEFT CERVICAL LYMPH NODE EXCISION;  Surgeon: Izora Gala, MD;  Location: [place];  Service: ENT;  Laterality: Left;  . TUBAL LIGATION      Allergies: Cephalexin; Doxycycline; Levofloxacin; Prilosec [omeprazole]; and Singulair [montelukast]  Medications: Prior to Admission medications   Medication Sig Start Date End Date Taking? Authorizing Provider  acetaminophen (TYLENOL) 500 MG tablet Take 500 mg by mouth every 8 (eight) hours as needed for mild pain or moderate pain.     [provider]  azithromycin (ZITHROMAX Z-PAK) 250 MG tablet Take 2 tablets by mouth on day one, then take 1 tablet daily for 4 days. 11/18/17   Brunetta Genera, MD  dexamethasone (DECADRON) 4 MG tablet Take 2 tablets by mouth once a day on the day after chemotherapy and then take 2 tablets two times a day for 2 days. Take with food. 10/05/17   Brunetta Genera, MD  lidocaine-prilocaine (EMLA) cream Apply to affected area once Patient taking differently: Apply 1 application topically daily as needed. Port 10/05/17   Brunetta Genera, MD  LORazepam (ATIVAN) 0.5 MG tablet Take 1 tablet (0.5 mg total) by mouth every 8 (eight) hours as needed for anxiety (nausea). 09/16/17   Brunetta Genera, MD  ondansetron (ZOFRAN) 8 MG tablet Take 1 tablet (8 mg total) by mouth 2 (two) times daily as needed. Start on the third day after chemotherapy. 10/05/17   Brunetta Genera, MD  polyethylene glycol [facility] / Floria Raveling) packet Take 17 g by mouth daily.    [provider]  prochlorperazine (COMPAZINE) 10 MG tablet Take 1 tablet (10 mg total) by mouth every 6 (six) hours as needed (Nausea or vomiting). 10/05/17   Brunetta Genera, MD  promethazine (PHENERGAN) 25 MG suppository Place 1 suppository (25 mg total) rectally every 6 (six) hours as needed for nausea or vomiting. 09/22/17   Izora Gala, MD  triamcinolone (NASACORT AQ) 55 MCG/ACT AERO nasal inhaler Place 2 sprays into the nose daily as needed (allergies).     [provider]  Family History  Problem Relation Age of Onset  . Cancer Mother        liver  . Heart attack Father   . Hypertension Father   . Diabetes Mellitus I Father   . Cancer Sister        pancreatic, breast  . Diabetes Mellitus I Sister   . Heart disease Brother   . Heart disease Brother   . Heart disease Brother   . Diabetes Mellitus I Sister   . Diabetes Mellitus I Sister     Social History   Socioeconomic History  . Marital status: Single    Spouse  name: Not on file  . Number of children: Not on file  . Years of education: Not on file  . Highest education level: Not on file  Occupational History  . Not on file  Social Needs  . Financial resource strain: Not on file  . Food insecurity:    Worry: Not on file    Inability: Not on file  . Transportation needs:    Medical: Not on file    Non-medical: Not on file  Tobacco Use  . Smoking status: Former Smoker    Last attempt to quit: 06/24/1988    Years since quitting: 29.6  . Smokeless tobacco: Never Used  Substance and Sexual Activity  . Alcohol use: No  . Drug use: No  . Sexual activity: Not on file  Lifestyle  . Physical activity:    Days per week: Not on file    Minutes per session: Not on file  . Stress: Not on file  Relationships  . Social connections:    Talks on phone: Not on file    Gets together: Not on file    Attends religious service: Not on file    Active member of club or organization: Not on file    Attends meetings of clubs or organizations: Not on file    Relationship status: Not on file  Other Topics Concern  . Not on file  Social History Narrative  . Not on file     Review of Systems: A 12 point ROS discussed and pertinent positives are indicated in the HPI above.  All other systems are negative.  Review of Systems  Constitutional: Negative for chills and fever.  Respiratory: Negative for shortness of breath and wheezing.   Cardiovascular: Negative for chest pain and palpitations.  Gastrointestinal: Negative for abdominal pain.  Neurological: Negative for dizziness and headaches.  Psychiatric/Behavioral: Negative for behavioral problems and confusion.    Vital Signs: BP (!) 143/86   Pulse 81   Temp 98.1 F (36.7 C) (Oral)   Resp 18   Ht 5\' 8"  (1.727 m)   Wt 185 lb (83.9 kg)   SpO2 96%   BMI 28.13 kg/m   Physical Exam  Constitutional: She is oriented to person, place, and time. She appears well-developed and well-nourished. No  distress.  Cardiovascular: Normal rate, regular rhythm and normal heart sounds.  No murmur heard. Pulmonary/Chest: Effort normal and breath sounds normal. No respiratory distress. She has no wheezes.  Neurological: She is alert and oriented to person, place, and time.  Skin: Skin is warm and dry.  Psychiatric: She has a normal mood and affect. Her behavior is normal. Judgment and thought content normal.  Nursing note and vitals reviewed.    MD Evaluation Airway: WNL Heart: WNL Abdomen: WNL Chest/ Lungs: WNL ASA  Classification: 3 Mallampati/Airway Score: One   Imaging: Nm Pet Image  Restag (ps) Skull Base To Thigh  Result Date: 01/15/2018 CLINICAL DATA:  Subsequent treatment strategy for Hodgkin lymphoma. EXAM: NUCLEAR MEDICINE PET SKULL BASE TO THIGH TECHNIQUE: 9.2 mCi F-18 FDG was injected intravenously. Full-ring PET imaging was performed from the skull base to thigh after the radiotracer. CT data was obtained and used for attenuation correction and anatomic localization. Fasting blood glucose: 106 mg/dl COMPARISON:  09/17/2017 FINDINGS: Mediastinal blood pool activity: SUV max 2.5 NECK: The left-sided level II cervical lymphadenopathy seen on CT imaging of prior study has resolved. The index 2.3 cm lymph node on that exam now measures 0.5 cm short axis. PET imaging reveals resolution of hypermetabolism in these lymph nodes with SUV max = 2.1 today. Incidental CT findings: none CHEST: No hypermetabolic mediastinal or hilar nodes. No suspicious pulmonary nodules on the CT scan. No change in the non FDG avid 8 mm right middle lobe pulmonary nodule. Incidental CT findings: Chronic underlying interstitial changes are seen in the lungs with basilar predominant compressive atelectasis. No pleural effusion. Aneurysmal dilation of the descending thoracic aorta is similar to prior measuring 4.5 cm diameter today. Right Port-A-Cath tip is positioned in the mid SVC. ABDOMEN/PELVIS: No abnormal  hypermetabolic activity within the liver, pancreas, adrenal glands, or spleen. No hypermetabolic lymph nodes in the abdomen or pelvis. The 1.9 cm retroperitoneal nodule, just posterior to the upper pole of the right kidney is stable in the interval with a central dystrophic calcification. This remains FDG avid with SUV max = 3.3 today compared to 4.9 previously. Tiny lymph nodes in the left groin are stable in size in the interval but show FDG uptake on today's study, a new finding. 5 mm short axis left groin lymph node (4:184) demonstrates SUV max = 3.3. 7 mm short axis left groin lymph node (4:192) shows a central fatty hilum and FDG uptake with SUV max = 3.6. Incidental CT findings: Stable 15 mm left adrenal nodule compatible with adenoma. Haziness in the central small bowel mesentery with scattered small mesenteric lymph nodes unchanged in the interval. There is abdominal aortic atherosclerosis. Small umbilical hernia contains only fat. Supraumbilical epigastric ventral hernia contains only fat with hernia sac measuring 2.2 x 4.1 cm. Diverticular changes are noted in the left colon. SKELETON: No focal hypermetabolic activity to suggest skeletal metastasis. Incidental CT findings: none IMPRESSION: 1. Interval complete response of left cervical hypermetabolic lymphadenopathy, now [place] 2. 2. Stable 1.9 cm hypermetabolic retroperitoneal soft tissue nodule in the posterior right upper quadrant. Indeterminate. 3. Tiny left groin lymph nodes are unchanged in size since prior PET-CT and 1 of these demonstrates a clear central fatty hilum. Both are FDG avid on today's exam (not seen previously). Given response of cervical disease, these features may reflect reactive etiology and attention on follow-up suggested. 4. Stable fusiform descending thoracic aortic aneurysm. Follow-up CT in 1 year could be used to reassess. Electronically Signed   By: Misty Stanley M.D.   On: 01/15/2018 14:52    Labs:  CBC: Recent  Labs    12/02/17 0757 12/16/17 0755 12/29/17 0806 01/20/18 0823  WBC 5.7 4.5 3.8* 7.9  HGB 9.8* 10.1* 10.2* 10.2*  HCT 31.5* 30.8* 32.6* 33.0*  PLT 386 377 334 428*    COAGS: Recent Labs    10/10/17 0957  INR 0.96    BMP: Recent Labs    12/02/17 0757 12/16/17 0755 12/29/17 0806 01/20/18 0823  NA 138 139 141 140  K 4.1 3.8 4.5 3.9  CL 106 107  107 105  CO2 23 23 26 25   GLUCOSE 100 93 102* 97  BUN 8 9 8 9   CALCIUM 9.7 9.6 9.9 9.9  CREATININE 0.71 0.73 0.71 0.74  GFRNONAA >60 >60 >60 >60  GFRAA >60 >60 >60 >60    LIVER FUNCTION TESTS: Recent Labs    12/02/17 0757 12/16/17 0755 12/29/17 0806 01/20/18 0823  BILITOT <0.2* <0.2* <0.2* 0.3  AST 15 18 15 15   ALT 15 11 16 8   ALKPHOS 83 66 69 83  PROT 6.7 6.6 6.6 6.7  ALBUMIN 3.4* 3.6 3.7 3.3*    TUMOR MARKERS: No results for input(s): AFPTM, CEA, CA199, CHROMGRNA in the last 8760 hours.  Assessment and Plan:  Hodgkin's lymphoma. Plan for image-guided Port-a-cath removal today with Dr. Pascal Lux. Patient is NPO. Deneis fever. She does not take blood thinners. INR pending.  Risks and benefits of image guided port-a-catheter removal was discussed with the patient including, but not limited to bleeding, infection, pneumothorax, or fibrin sheath development and need for additional procedures. All of the patient's questions were answered, patient is agreeable to proceed. Consent signed and in chart.   Thank you for this interesting consult.  I greatly enjoyed meeting Jennifer Juarez and look forward to participating in their care.  A copy of this report was sent to the requesting provider on this date.  Electronically Signed: Earley Abide, PA-C 02/05/2018, 10:41 AM   I spent a total of 30 Minutes in face to face in clinical consultation, greater than 50% of which was counseling/coordinating care for Hodgkin's lymphoma.

## 2018-02-05 NOTE — Discharge Instructions (Signed)
Moderate Conscious Sedation, Adult, Care After °These instructions provide you with information about caring for yourself after your procedure. Your health care provider may also give you more specific instructions. Your treatment has been planned according to current medical practices, but problems sometimes occur. Call your health care provider if you have any problems or questions after your procedure. °What can I expect after the procedure? °After your procedure, it is common: °· To feel sleepy for several hours. °· To feel clumsy and have poor balance for several hours. °· To have poor judgment for several hours. °· To vomit if you eat too soon. ° °Follow these instructions at home: °For at least 24 hours after the procedure: ° °· Do not: °? Participate in activities where you could fall or become injured. °? Drive. °? Use heavy machinery. °? Drink alcohol. °? Take sleeping pills or medicines that cause drowsiness. °? Make important decisions or sign legal documents. °? Take care of children on your own. °· Rest. °Eating and drinking °· Follow the diet recommended by your health care provider. °· If you vomit: °? Drink water, juice, or soup when you can drink without vomiting. °? Make sure you have little or no nausea before eating solid foods. °General instructions °· Have a responsible adult stay with you until you are awake and alert. °· Take over-the-counter and prescription medicines only as told by your health care provider. °· If you smoke, do not smoke without supervision. °· Keep all follow-up visits as told by your health care provider. This is important. °Contact a health care provider if: °· You keep feeling nauseous or you keep vomiting. °· You feel light-headed. °· You develop a rash. °· You have a fever. °Get help right away if: °· You have trouble breathing. °This information is not intended to replace advice given to you by your health care provider. Make sure you discuss any questions you have  with your health care provider. °Document Released: 03/31/2013 Document Revised: 11/13/2015 Document Reviewed: 09/30/2015 °Elsevier Interactive Patient Education © 2018 [employer] ° ° ° ° °Implanted Port Removal, Care After °Refer to this sheet in the next few weeks. These instructions provide you with information about caring for yourself after your procedure. Your health care provider may also give you more specific instructions. Your treatment has been planned according to current medical practices, but problems sometimes occur. Call your health care provider if you have any problems or questions after your procedure. °What can I expect after the procedure? °After the procedure, it is common to have: °· Soreness or pain near your incision. °· Some swelling or bruising near your incision. ° °Follow these instructions at home: °Medicines °· Take over-the-counter and prescription medicines only as told by your health care provider. °· If you were prescribed an antibiotic medicine, take it as told by your health care provider. Do not stop taking the antibiotic even if you start to feel better. °Bathing °· Do not take baths, swim, or use a hot tub until your health care provider approves. Ask your health care provider if you can take showers. You may only be allowed to take sponge baths for bathing. °Incision care °· Follow instructions from your health care provider about how to take care of your incision. Make sure you: °? Wash your hands with soap and water before you change your bandage (dressing). If soap and water are not available, use hand sanitizer. °? Change your dressing as told by your health care provider. °?   Keep your dressing dry. °? Leave stitches (sutures), skin glue, or adhesive strips in place. These skin closures may need to stay in place for 2 weeks or longer. If adhesive strip edges start to loosen and curl up, you may trim the loose edges. Do not remove adhesive strips completely unless your  health care provider tells you to do that. °· Check your incision area every day for signs of infection. Check for: °? More redness, swelling, or pain. °? More fluid or blood. °? Warmth. °? Pus or a bad smell. °Driving °· If you received a sedative, do not drive for 24 hours after the procedure. °· If you did not receive a sedative, ask your health care provider when it is safe to drive. °Activity °· Return to your normal activities as told by your health care provider. Ask your health care provider what activities are safe for you. °· Until your health care provider says it is safe: °? Do not lift anything that is heavier than 10 lb (4.5 kg). °? Do not do activities that involve lifting your arms over your head. °General instructions °· Do not use any tobacco products, such as cigarettes, chewing tobacco, and e-cigarettes. Tobacco can delay healing. If you need help quitting, ask your health care provider. °· Keep all follow-up visits as told by your health care provider. This is important. °Contact a health care provider if: °· You have more redness, swelling, or pain around your incision. °· You have more fluid or blood coming from your incision. °· Your incision feels warm to the touch. °· You have pus or a bad smell coming from your incision. °· You have a fever. °· You have pain that is not relieved by your pain medicine. °Get help right away if: °· You have chest pain. °· You have difficulty breathing. °This information is not intended to replace advice given to you by your health care provider. Make sure you discuss any questions you have with your health care provider. °Document Released: 05/22/2015 Document Revised: 11/16/2015 Document Reviewed: 03/15/2015 °Elsevier Interactive Patient Education © 2018 [employer] ° °

## 2018-02-05 NOTE — Procedures (Signed)
Pre Procedural Dx: Poor venous access Post Procedural Dx: Same  Successful removal of anterior chest wall port-a-cath.  EBL: Minimal  No immediate post procedural complications.   Jay Seleste Tallman, MD Pager #: [phone]   

## 2018-03-06 DIAGNOSIS — H43813 Vitreous degeneration, bilateral: Secondary | ICD-10-CM | POA: Diagnosis not present

## 2018-03-27 DIAGNOSIS — H52221 Regular astigmatism, right eye: Secondary | ICD-10-CM | POA: Diagnosis not present

## 2018-03-27 DIAGNOSIS — H5203 Hypermetropia, bilateral: Secondary | ICD-10-CM | POA: Diagnosis not present

## 2018-04-15 DIAGNOSIS — R69 Illness, unspecified: Secondary | ICD-10-CM | POA: Diagnosis not present

## 2018-04-21 NOTE — Progress Notes (Signed)
HEMATOLOGY/ONCOLOGY CLINIC NOTE  Date of Service: 04/22/18    Patient Care Team: Kathyrn Lass, MD as PCP - General (Family Medicine)  CHIEF COMPLAINTS/PURPOSE OF CONSULTATION:  F/u for continued mx of  Hodgkin's Lymphoma   HISTORY OF PRESENTING ILLNESS:   Jennifer Juarez is a wonderful 72 y.o. female who has been referred to Jennifer Juarez by Dr. Lattie Haw Miller/Courtney Rolland Porter PA_C  for evaluation and management of likely newly diagnosed Hodgkin's Lymphoma.   The pt reports that she is doing very well overall and notes that she is fully functional and does not have any significant chronic medical problems or any functional limitations.  She notes that on 06/26/17 she noticed a knot on the left side of her neck that subsequently precipitated her CT scan and Bx prior to seeing Jennifer Juarez today.   On 07/28/17 the pt had a CT Soft Tissue Neck revealing Enlarged lymph nodes in the left neck compatible with neoplasm.Enlarged lymph nodes in the left neck. Large left level 2 lymph node 23 x 30 mm. Multiple posterior lymph nodes are present measuring up to 10 mm. No enlarged lymph nodes in the right neck. Biopsy recommended. Attention left tonsil on direct mucosal inspection. Possible tonsillar carcinoma on the left. Lymphoma also in the differential.    Of note prior to the patient's visit, pt has had a Lymph node Needle/Core biopsy completed on 08/12/17 with results revealing ATYPICAL LYMPHOID PROLIFERATION. Immunohistochemical stains were performed including LCA, CD20, PAX-5, CD3, CD15, and CD30 with appropriate controls. The large atypical lymphoid-appearing cells are positive for CD30, CD15 and PAX-5 and negative for CD3, CD20 and LCA. The lymphocytic population in the background show a mixture of T and B-cells with predominance of T-cells. The overall findings are limited but atypical and worrisome for a lymphoproliferative process, particularly. classical Hodgkin lymphoma. Excisional biopsy is strongly  recommended.  Also on 08/12/17 the pt had a Tissue Flow Cytometry revealing NO MONOCLONAL B-CELL POPULATION OR ABNORMAL T-CELL PHENOTYPE IDENTIFIED.   Most recent CBC with Diff. results (07/15/17) revealed all values WNL.   She notes that she has had a viral infection in 2005 that resulted in a complete loss of hearing in her right ear. She notes that her left ear has had fluid build up and has resulted in significant loss of hearing, for which she uses hearing aids.  On review of systems, pt reports good energy levels, and denies fevers, chills, night sweats, unexpected weight loss, skin itching, rashes, soreness, sore throat, difficulties swallowing, back pains, abdominal pains, leg swelling, and any other symptoms.   On PMHx the pt reports arthritis, vertigo, polyps. She denies heart, lung, kidney or liver problems. She denies DM.  On Social Hx the pt notes having quit smoking when she was 68. She reports infrequent EOH consumption. She denies chemical or radiation exposure.  On Family Hx she reports that her mother had liver cancer. Her sister had breast cancer at 54 y/o and pancreatic cancer at 74 y/o.  Interval History:   [name] is here for a scheduled follow-up of her classical Hodgkin's Lymphoma. The patient's last visit with Jennifer Juarez was on 01/20/18. The pt reports that she is doing well overall.   The pt reports that she has had no new concerns in the interim. She currently is having seasonal allergies and notes that this isn't too bothersome for her. The pt notes that she had her flu vaccine last week.   The pt notes that she has been checking herself  for signs of new lumps or bumps and hasn't noticed any. She also denies any fevers, chills, night sweats or unexpected weight loss.   The pt has been taking Vitamin D and Vitamin B12 replacement.   Lab results today (04/22/18) of CBC w/diff, CMP is as follows: all values are WNL. 04/22/18 Sed Rate is WNL at 16  On review of systems, pt  reports good energy levels, staying active, eating well, seasonal allergies, and denies fevers, chills, night sweats, unexpected weight loss, noticing any new lumps or bumps, abdominal pains, leg swelling, and any other symptoms.   MEDICAL HISTORY:  Past Medical History:  Diagnosis Date  . BPPV (benign paroxysmal positional vertigo)   . Cancer ([place])   . H/O seasonal allergies   . Hearing loss in right ear   . Left cervical lymphadenopathy   . Macular degeneration   . Obesity   . Osteopenia   . Pre-syncope 11/09/2017  . Retinal vein occlusion   . Vitamin D deficiency     SURGICAL HISTORY: Past Surgical History:  Procedure Laterality Date  . CATARACT EXTRACTION, BILATERAL    . CHOLECYSTECTOMY    . IR FLUORO GUIDE PORT INSERTION RIGHT  10/10/2017  . IR REMOVAL TUN ACCESS W/ PORT W/O FL MOD SED  02/05/2018  . IR Jennifer Juarez GUIDE VASC ACCESS RIGHT  10/10/2017  . MASS EXCISION Left 09/22/2017   Procedure: LEFT CERVICAL LYMPH NODE EXCISION;  Surgeon: Izora Gala, MD;  Location: [facility];  Service: ENT;  Laterality: Left;  . TUBAL LIGATION      SOCIAL HISTORY: Social History   Socioeconomic History  . Marital status: Single    Spouse name: Not on file  . Number of children: Not on file  . Years of education: Not on file  . Highest education level: Not on file  Occupational History  . Not on file  Social Needs  . Financial resource strain: Not on file  . Food insecurity:    Worry: Not on file    Inability: Not on file  . Transportation needs:    Medical: Not on file    Non-medical: Not on file  Tobacco Use  . Smoking status: Former Smoker    Last attempt to quit: 06/24/1988    Years since quitting: 29.8  . Smokeless tobacco: Never Used  Substance and Sexual Activity  . Alcohol use: No  . Drug use: No  . Sexual activity: Not on file  Lifestyle  . Physical activity:    Days per week: Not on file    Minutes per session: Not on file  . Stress: Not on file   Relationships  . Social connections:    Talks on phone: Not on file    Gets together: Not on file    Attends religious service: Not on file    Active member of club or organization: Not on file    Attends meetings of clubs or organizations: Not on file    Relationship status: Not on file  . Intimate partner violence:    Fear of current or ex partner: Not on file    Emotionally abused: Not on file    Physically abused: Not on file    Forced sexual activity: Not on file  Other Topics Concern  . Not on file  Social History Narrative  . Not on file    FAMILY HISTORY: Family History  Problem Relation Age of Onset  . Cancer Mother  liver  . Heart attack Father   . Hypertension Father   . Diabetes Mellitus I Father   . Cancer Sister        pancreatic, breast  . Diabetes Mellitus I Sister   . Heart disease Brother   . Heart disease Brother   . Heart disease Brother   . Diabetes Mellitus I Sister   . Diabetes Mellitus I Sister     ALLERGIES:  is allergic to cephalexin; doxycycline; levofloxacin; prilosec [omeprazole]; and singulair [montelukast].  MEDICATIONS:  Current Outpatient Medications  Medication Sig Dispense Refill  . acetaminophen (TYLENOL) 500 MG tablet Take 500 mg by mouth every 8 (eight) hours as needed for mild pain or moderate pain.     Marland Kitchen azithromycin (ZITHROMAX Z-PAK) 250 MG tablet Take 2 tablets by mouth on day one, then take 1 tablet daily for 4 days. 6 each 0  . dexamethasone (DECADRON) 4 MG tablet Take 2 tablets by mouth once a day on the day after chemotherapy and then take 2 tablets two times a day for 2 days. Take with food. (Patient not taking: Reported on 04/22/2018) 30 tablet 1  . lidocaine-prilocaine (EMLA) cream Apply to affected area once (Patient taking differently: Apply 1 application topically daily as needed. Port) 30 g 3  . loratadine (CLARITIN) 10 MG tablet Take 10 mg by mouth daily.    Marland Kitchen LORazepam (ATIVAN) 0.5 MG tablet Take 1 tablet  (0.5 mg total) by mouth every 8 (eight) hours as needed for anxiety (nausea). 30 tablet 0  . ondansetron (ZOFRAN) 8 MG tablet Take 1 tablet (8 mg total) by mouth 2 (two) times daily as needed. Start on the third day after chemotherapy. 30 tablet 1  . polyethylene glycol (MIRALAX / GLYCOLAX) packet Take 17 g by mouth daily.    . prochlorperazine (COMPAZINE) 10 MG tablet Take 1 tablet (10 mg total) by mouth every 6 (six) hours as needed (Nausea or vomiting). (Patient not taking: Reported on 04/22/2018) 30 tablet 1  . promethazine (PHENERGAN) 25 MG suppository Place 1 suppository (25 mg total) rectally every 6 (six) hours as needed for nausea or vomiting. 12 suppository 1  . triamcinolone (NASACORT AQ) 55 MCG/ACT AERO nasal inhaler Place 2 sprays into the nose daily as needed (allergies).      No current facility-administered medications for this visit.     REVIEW OF SYSTEMS:    A 10+ POINT REVIEW OF SYSTEMS WAS OBTAINED including neurology, dermatology, psychiatry, cardiac, respiratory, lymph, extremities, GI, GU, Musculoskeletal, constitutional, breasts, reproductive, HEENT.  All pertinent positives are noted in the HPI.  All others are negative.   PHYSICAL EXAMINATION: ECOG PERFORMANCE STATUS: 1 BP 134/69 (BP Location: Left Arm, Patient Position: Sitting)   Pulse 81   Temp 97.6 F (36.4 C) (Oral)   Resp 18   Ht 5\' 8"  (1.727 m)   Wt 192 lb (87.1 kg)   SpO2 99%   BMI 29.19 kg/m   GENERAL:alert, in no acute distress and comfortable SKIN: fleshy, reddish-brown plaque-like region on outer aspect of right leg EYES: conjunctiva are pink and non-injected, sclera anicteric OROPHARYNX: MMM, no exudates, no oropharyngeal erythema or ulceration NECK: supple, no JVD LYMPH:  no palpable lymphadenopathy in the cervical, axillary or inguinal regions LUNGS: clear to auscultation b/l with normal respiratory effort HEART: regular rate & rhythm ABDOMEN:  normoactive bowel sounds , non tender, not  distended. No palpable hepatosplenomegaly.  Extremity: no pedal edema PSYCH: alert & oriented x 3 with fluent speech  NEURO: no focal motor/sensory deficits   LABORATORY DATA:  I have reviewed the data as listed  . CBC Latest Ref Rng & Units 04/22/2018 02/05/2018 01/20/2018  WBC 4.0 - 10.5 K/uL 7.0 7.1 7.9  Hemoglobin 12.0 - 15.0 g/dL 12.3 11.6(L) 10.2(L)  Hematocrit 36.0 - 46.0 % 38.8 37.2 33.0(L)  Platelets 150 - 400 K/uL 309 377 428(H)   . CMP Latest Ref Rng & Units 04/22/2018 01/20/2018 12/29/2017  Glucose 70 - 99 mg/dL 95 97 102(H)  BUN 8 - 23 mg/dL 11 9 8   Creatinine 0.44 - 1.00 mg/dL 0.81 0.74 0.71  Sodium 135 - 145 mmol/L 141 140 141  Potassium 3.5 - 5.1 mmol/L 4.0 3.9 4.5  Chloride 98 - 111 mmol/L 108 105 107  CO2 22 - 32 mmol/L 25 25 26   Calcium 8.9 - 10.3 mg/dL 9.4 9.9 9.9  Total Protein 6.5 - 8.1 g/dL 6.9 6.7 6.6  Total Bilirubin 0.3 - 1.2 mg/dL 0.4 0.3 <0.2(L)  Alkaline Phos 38 - 126 U/L 85 83 69  AST 15 - 41 U/L 17 15 15   ALT 0 - 44 U/L 9 8 16      Component     Latest Ref Rng & Units 08/29/2017  Retic Ct Pct     0.7 - 2.1 % 0.8  RBC.     3.70 - 5.45 MIL/uL 4.60  Retic Count, Absolute     33.7 - 90.7 K/uL 36.8  Sed Rate     0 - 22 mm/hr 30 (H)  LDH     125 - 245 U/L 179  HCV Ab     0.0 - 0.9 s/co ratio <0.1  Hepatitis B Surface Ag     Negative Negative  Hep B Core Ab, Tot     Negative Negative     08/12/17 Lymph Node Needle/core Biopsy:   08/12/17 Tissue Flow Cytometry:    09/22/17 Tissue Flow Cytometry    09/22/17 Lymph Node Pathology:   RADIOGRAPHIC STUDIES: I have personally reviewed the radiological images as listed and agreed with the findings in the report. No results found.  ASSESSMENT & PLAN:   72 y.o. female with  1. Stage IA Hodgkin's Lymphoma (mixed cellularity) No constitutional symptoms Sed rate elevated to 30 PET findings most consistent with Stage I disease; discussed that a small hypermetabolic nodule found in her liver has  remain unchanged for 4 years and is not considered to be involved with her Hodgkin's lymphoma.  -Left cervical lymph nodes are involved without further involvement.  01/15/18 PET/CT  revealed Interval complete response of left cervical hypermetabolic lymphadenopathy, now [place] 2. Stable 1.9 cm hypermetabolic retroperitoneal soft tissue nodule in the posterior right upper quadrant. Indeterminate. Tiny left groin lymph nodes are unchanged in size since prior PET-CT and 1 of these demonstrates a clear central fatty hilum. Both are FDG avid on today's exam (not seen previously). Given response of cervical disease, these features may reflect reactive etiology and attention on follow-up suggested. Stable fusiform descending thoracic aortic aneurysm. Follow-up CT in 1 year could be used to reassess.   PLAN -Discussed pt labwork today, 04/22/18; blood counts and chemistries are normal. Sed Rate is WNL at 16.  -Discussed that the pt is due for her Prevnar vaccine in 2020, and is already due for her Pneumovax. Recommended that the pt receive these. She notes that she will pursue these with her PCP.  -Pt has already received her flu vaccine  -The pt shows no clinical or lab  progression/return of Hodgkin's lymphoma at this time.  -No indication for further treatment at this time.   -Recommended that the pt continue to eat well, drink at least 48-64 oz of water each day, and walk 20-30 minutes each day.  -Will see the pt back in 3 months, sooner if any new concerns   RTC with Dr Irene Limbo with labs in 3 months   All of the patients questions were answered with apparent satisfaction. The patient knows to call the clinic with any problems, questions or concerns.  The total time spent in the appt was 20 minutes and more than 50% was on counseling and direct patient cares.    Sullivan Lone MD MS AAHIVMS [facility] Hematology/Oncology Physician [facility]  (Office):       [phone] (Work cell):   [phone] (Fax):           [phone]  04/22/2018 10:34 AM  I, Baldwin [place], am acting as a scribe for Dr. Irene Limbo  .I have reviewed the above documentation for accuracy and completeness, and I agree with the above. Brunetta Genera MD

## 2018-04-22 ENCOUNTER — Inpatient Hospital Stay: Payer: Medicare HMO | Attending: Hematology

## 2018-04-22 ENCOUNTER — Encounter: Payer: Self-pay | Admitting: Hematology

## 2018-04-22 ENCOUNTER — Telehealth: Payer: Self-pay | Admitting: Hematology

## 2018-04-22 ENCOUNTER — Inpatient Hospital Stay: Payer: Medicare HMO | Admitting: Hematology

## 2018-04-22 VITALS — BP 134/69 | HR 81 | Temp 97.6°F | Resp 18 | Ht 68.0 in | Wt 192.0 lb

## 2018-04-22 DIAGNOSIS — C8121 Mixed cellularity classical Hodgkin lymphoma, lymph nodes of head, face, and neck: Secondary | ICD-10-CM | POA: Diagnosis not present

## 2018-04-22 DIAGNOSIS — Z87891 Personal history of nicotine dependence: Secondary | ICD-10-CM

## 2018-04-22 DIAGNOSIS — Z79899 Other long term (current) drug therapy: Secondary | ICD-10-CM | POA: Insufficient documentation

## 2018-04-22 DIAGNOSIS — I712 Thoracic aortic aneurysm, without rupture: Secondary | ICD-10-CM | POA: Insufficient documentation

## 2018-04-22 LAB — CBC WITH DIFFERENTIAL/PLATELET
ABS IMMATURE GRANULOCYTES: 0.01 10*3/uL (ref 0.00–0.07)
BASOS PCT: 1 %
Basophils Absolute: 0 10*3/uL (ref 0.0–0.1)
Eosinophils Absolute: 0.2 10*3/uL (ref 0.0–0.5)
Eosinophils Relative: 2 %
HEMATOCRIT: 38.8 % (ref 36.0–46.0)
HEMOGLOBIN: 12.3 g/dL (ref 12.0–15.0)
Immature Granulocytes: 0 %
LYMPHS ABS: 2 10*3/uL (ref 0.7–4.0)
LYMPHS PCT: 29 %
MCH: 26.9 pg (ref 26.0–34.0)
MCHC: 31.7 g/dL (ref 30.0–36.0)
MCV: 84.7 fL (ref 80.0–100.0)
MONO ABS: 0.7 10*3/uL (ref 0.1–1.0)
MONOS PCT: 9 %
NEUTROS ABS: 4.2 10*3/uL (ref 1.7–7.7)
Neutrophils Relative %: 59 %
Platelets: 309 10*3/uL (ref 150–400)
RBC: 4.58 MIL/uL (ref 3.87–5.11)
RDW: 14.9 % (ref 11.5–15.5)
WBC: 7 10*3/uL (ref 4.0–10.5)
nRBC: 0 % (ref 0.0–0.2)

## 2018-04-22 LAB — CMP (CANCER CENTER ONLY)
ALBUMIN: 3.6 g/dL (ref 3.5–5.0)
ALT: 9 U/L (ref 0–44)
AST: 17 U/L (ref 15–41)
Alkaline Phosphatase: 85 U/L (ref 38–126)
Anion gap: 8 (ref 5–15)
BILIRUBIN TOTAL: 0.4 mg/dL (ref 0.3–1.2)
BUN: 11 mg/dL (ref 8–23)
CO2: 25 mmol/L (ref 22–32)
Calcium: 9.4 mg/dL (ref 8.9–10.3)
Chloride: 108 mmol/L (ref 98–111)
Creatinine: 0.81 mg/dL (ref 0.44–1.00)
GFR, Est AFR Am: >60 mL/min (ref >60)
GFR, Estimated: >60 mL/min (ref >60)
GLUCOSE: 95 mg/dL (ref 70–99)
POTASSIUM: 4 mmol/L (ref 3.5–5.1)
SODIUM: 141 mmol/L (ref 135–145)
TOTAL PROTEIN: 6.9 g/dL (ref 6.5–8.1)

## 2018-04-22 LAB — SEDIMENTATION RATE: Sed Rate: 16 mm/hr (ref 0–22)

## 2018-04-22 NOTE — Telephone Encounter (Signed)
Appts scheduled avs/calendar declined due to my chart per 10/30 los

## 2018-05-06 DIAGNOSIS — E538 Deficiency of other specified B group vitamins: Secondary | ICD-10-CM | POA: Diagnosis not present

## 2018-05-06 DIAGNOSIS — J309 Allergic rhinitis, unspecified: Secondary | ICD-10-CM | POA: Diagnosis not present

## 2018-05-06 DIAGNOSIS — R899 Unspecified abnormal finding in specimens from other organs, systems and tissues: Secondary | ICD-10-CM | POA: Diagnosis not present

## 2018-05-06 DIAGNOSIS — Z23 Encounter for immunization: Secondary | ICD-10-CM | POA: Diagnosis not present

## 2018-05-06 DIAGNOSIS — J329 Chronic sinusitis, unspecified: Secondary | ICD-10-CM | POA: Diagnosis not present

## 2018-05-06 DIAGNOSIS — E559 Vitamin D deficiency, unspecified: Secondary | ICD-10-CM | POA: Diagnosis not present

## 2018-07-03 DIAGNOSIS — H31009 Unspecified chorioretinal scars, unspecified eye: Secondary | ICD-10-CM | POA: Diagnosis not present

## 2018-07-03 DIAGNOSIS — H348312 Tributary (branch) retinal vein occlusion, right eye, stable: Secondary | ICD-10-CM | POA: Diagnosis not present

## 2018-07-03 DIAGNOSIS — H359 Unspecified retinal disorder: Secondary | ICD-10-CM | POA: Diagnosis not present

## 2018-07-03 DIAGNOSIS — H33322 Round hole, left eye: Secondary | ICD-10-CM | POA: Diagnosis not present

## 2018-07-22 NOTE — Progress Notes (Signed)
HEMATOLOGY/ONCOLOGY CLINIC NOTE  Date of Service: 07/23/18    Patient Care Team: Kathyrn Lass, MD as PCP - General (Family Medicine)  CHIEF COMPLAINTS/PURPOSE OF CONSULTATION:  F/u for continued mx of  Hodgkin's Lymphoma   HISTORY OF PRESENTING ILLNESS:   Jennifer Juarez is a wonderful 73 y.o. female who has been referred to [place] by Dr. Lattie Haw Miller/Courtney Rolland Porter PA_C  for evaluation and management of likely newly diagnosed Hodgkin's Lymphoma.   The pt reports that she is doing very well overall and notes that she is fully functional and does not have any significant chronic medical problems or any functional limitations.  She notes that on 06/26/17 she noticed a knot on the left side of her neck that subsequently precipitated her CT scan and Bx prior to seeing [place] today.   On 07/28/17 the pt had a CT Soft Tissue Neck revealing Enlarged lymph nodes in the left neck compatible with neoplasm.Enlarged lymph nodes in the left neck. Large left level 2 lymph node 23 x 30 mm. Multiple posterior lymph nodes are present measuring up to 10 mm. No enlarged lymph nodes in the right neck. Biopsy recommended. Attention left tonsil on direct mucosal inspection. Possible tonsillar carcinoma on the left. Lymphoma also in the differential.    Of note prior to the patient's visit, pt has had a Lymph node Needle/Core biopsy completed on 08/12/17 with results revealing ATYPICAL LYMPHOID PROLIFERATION. Immunohistochemical stains were performed including LCA, CD20, PAX-5, CD3, CD15, and CD30 with appropriate controls. The large atypical lymphoid-appearing cells are positive for CD30, CD15 and PAX-5 and negative for CD3, CD20 and LCA. The lymphocytic population in the background show a mixture of T and B-cells with predominance of T-cells. The overall findings are limited but atypical and worrisome for a lymphoproliferative process, particularly. classical Hodgkin lymphoma. Excisional biopsy is strongly  recommended.  Also on 08/12/17 the pt had a Tissue Flow Cytometry revealing NO MONOCLONAL B-CELL POPULATION OR ABNORMAL T-CELL PHENOTYPE IDENTIFIED.   Most recent CBC with Diff. results (07/15/17) revealed all values WNL.   She notes that she has had a viral infection in 2005 that resulted in a complete loss of hearing in her right ear. She notes that her left ear has had fluid build up and has resulted in significant loss of hearing, for which she uses hearing aids.  On review of systems, pt reports good energy levels, and denies fevers, chills, night sweats, unexpected weight loss, skin itching, rashes, soreness, sore throat, difficulties swallowing, back pains, abdominal pains, leg swelling, and any other symptoms.   On PMHx the pt reports arthritis, vertigo, polyps. She denies heart, lung, kidney or liver problems. She denies DM.  On Social Hx the pt notes having quit smoking when she was 67. She reports infrequent EOH consumption. She denies chemical or radiation exposure.  On Family Hx she reports that her mother had liver cancer. Her sister had breast cancer at 9 y/o and pancreatic cancer at 75 y/o.  Interval History:   Jennifer Juarez is here for a scheduled follow-up of her classical Hodgkin's Lymphoma. The patient's last visit with [place] was on 04/22/18. The pt reports that she is doing well overall and she recently had a sinus infection x 2 weeks that she treated with OTC medications that are resolved. She did obtain her flu vaccination and her Pneumovax injection. She hasn't obtained her prevnar injection due to last time having 1 week of soreness and aching.   Lab results today (07/23/18)  of CBC w/diff and CMP is as follows: all values are WNL.  On review of systems, pt denies decreased energy levels, appetite changes, fever, chills, night sweats, new lumps/bumps, abdominal pain, leg swelling, skin rashes, itching, and any other symptoms.    MEDICAL HISTORY:  Past Medical History:  Diagnosis  Date  . BPPV (benign paroxysmal positional vertigo)   . Cancer ([place])   . H/O seasonal allergies   . Hearing loss in right ear   . Left cervical lymphadenopathy   . Macular degeneration   . Obesity   . Osteopenia   . Pre-syncope 11/09/2017  . Retinal vein occlusion   . Vitamin D deficiency     SURGICAL HISTORY: Past Surgical History:  Procedure Laterality Date  . CATARACT EXTRACTION, BILATERAL    . CHOLECYSTECTOMY    . IR FLUORO GUIDE PORT INSERTION RIGHT  10/10/2017  . IR REMOVAL TUN ACCESS W/ PORT W/O FL MOD SED  02/05/2018  . IR US GUIDE VASC ACCESS RIGHT  10/10/2017  . MASS EXCISION Left 09/22/2017   Procedure: LEFT CERVICAL LYMPH NODE EXCISION;  Surgeon: Izora Gala, MD;  Location: [facility];  Service: ENT;  Laterality: Left;  . TUBAL LIGATION      SOCIAL HISTORY: Social History   Socioeconomic History  . Marital status: Single    Spouse name: Not on file  . Number of children: Not on file  . Years of education: Not on file  . Highest education level: Not on file  Occupational History  . Not on file  Social Needs  . Financial resource strain: Not on file  . Food insecurity:    Worry: Not on file    Inability: Not on file  . Transportation needs:    Medical: Not on file    Non-medical: Not on file  Tobacco Use  . Smoking status: Former Smoker    Last attempt to quit: 06/24/1988    Years since quitting: 30.0  . Smokeless tobacco: Never Used  Substance and Sexual Activity  . Alcohol use: No  . Drug use: No  . Sexual activity: Not on file  Lifestyle  . Physical activity:    Days per week: Not on file    Minutes per session: Not on file  . Stress: Not on file  Relationships  . Social connections:    Talks on phone: Not on file    Gets together: Not on file    Attends religious service: Not on file    Active member of club or organization: Not on file    Attends meetings of clubs or organizations: Not on file    Relationship status: Not on  file  . Intimate partner violence:    Fear of current or ex partner: Not on file    Emotionally abused: Not on file    Physically abused: Not on file    Forced sexual activity: Not on file  Other Topics Concern  . Not on file  Social History Narrative  . Not on file    FAMILY HISTORY: Family History  Problem Relation Age of Onset  . Cancer Mother        liver  . Heart attack Father   . Hypertension Father   . Diabetes Mellitus I Father   . Cancer Sister        pancreatic, breast  . Diabetes Mellitus I Sister   . Heart disease Brother   . Heart disease Brother   . Heart disease  Brother   . Diabetes Mellitus I Sister   . Diabetes Mellitus I Sister     ALLERGIES:  is allergic to cephalexin; doxycycline; levofloxacin; prilosec [omeprazole]; and singulair [montelukast].  MEDICATIONS:  Current Outpatient Medications  Medication Sig Dispense Refill  . acetaminophen (TYLENOL) 500 MG tablet Take 500 mg by mouth every 8 (eight) hours as needed for mild pain or moderate pain.     Marland Kitchen azithromycin (ZITHROMAX Z-PAK) 250 MG tablet Take 2 tablets by mouth on day one, then take 1 tablet daily for 4 days. 6 each 0  . dexamethasone (DECADRON) 4 MG tablet Take 2 tablets by mouth once a day on the day after chemotherapy and then take 2 tablets two times a day for 2 days. Take with food. (Patient not taking: Reported on 04/22/2018) 30 tablet 1  . lidocaine-prilocaine (EMLA) cream Apply to affected area once (Patient taking differently: Apply 1 application topically daily as needed. Port) 30 g 3  . loratadine (CLARITIN) 10 MG tablet Take 10 mg by mouth daily.    Marland Kitchen LORazepam (ATIVAN) 0.5 MG tablet Take 1 tablet (0.5 mg total) by mouth every 8 (eight) hours as needed for anxiety (nausea). 30 tablet 0  . ondansetron (ZOFRAN) 8 MG tablet Take 1 tablet (8 mg total) by mouth 2 (two) times daily as needed. Start on the third day after chemotherapy. 30 tablet 1  . polyethylene glycol (MIRALAX / GLYCOLAX)  packet Take 17 g by mouth daily.    . prochlorperazine (COMPAZINE) 10 MG tablet Take 1 tablet (10 mg total) by mouth every 6 (six) hours as needed (Nausea or vomiting). (Patient not taking: Reported on 04/22/2018) 30 tablet 1  . promethazine (PHENERGAN) 25 MG suppository Place 1 suppository (25 mg total) rectally every 6 (six) hours as needed for nausea or vomiting. 12 suppository 1  . triamcinolone (NASACORT AQ) 55 MCG/ACT AERO nasal inhaler Place 2 sprays into the nose daily as needed (allergies).      No current facility-administered medications for this visit.     REVIEW OF SYSTEMS:    A 10+ POINT REVIEW OF SYSTEMS WAS OBTAINED including neurology, dermatology, psychiatry, cardiac, respiratory, lymph, extremities, GI, GU, Musculoskeletal, constitutional, breasts, reproductive, HEENT.  All pertinent positives are noted in the HPI.  All others are negative.   PHYSICAL EXAMINATION: ECOG PERFORMANCE STATUS: 1 BP (!) 150/78 (BP Location: Left Arm, Patient Position: Sitting) Comment: Notified Nurse of BP  Pulse 85   Temp 97.9 F (36.6 C) (Oral)   Resp 18   Ht 5\' 8"  (1.727 m)   Wt 199 lb 12.8 oz (90.6 kg)   SpO2 98%   BMI 30.38 kg/m   GENERAL:alert, in no acute distress and comfortable SKIN: no acute rashes, no significant lesions EYES: conjunctiva are pink and non-injected, sclera anicteric OROPHARYNX: MMM, no exudates, no oropharyngeal erythema or ulceration NECK: supple, no JVD LYMPH:  no palpable lymphadenopathy in the cervical, axillary or inguinal regions LUNGS: clear to auscultation b/l with normal respiratory effort HEART: regular rate & rhythm ABDOMEN:  normoactive bowel sounds , non tender, not distended. No palpable hepatosplenomegaly.  Extremity: no pedal edema PSYCH: alert & oriented x 3 with fluent speech NEURO: no focal motor/sensory deficits   LABORATORY DATA:  I have reviewed the data as listed  . CBC Latest Ref Rng & Units 07/23/2018 04/22/2018 02/05/2018  WBC  4.0 - 10.5 K/uL 6.5 7.0 7.1  Hemoglobin 12.0 - 15.0 g/dL 12.2 12.3 11.6(L)  Hematocrit 36.0 - 46.0 %  38.5 38.8 37.2  Platelets 150 - 400 K/uL 294 309 377   . CMP Latest Ref Rng & Units 07/23/2018 04/22/2018 01/20/2018  Glucose 70 - 99 mg/dL 92 95 97  BUN 8 - 23 mg/dL 11 11 9   Creatinine 0.44 - 1.00 mg/dL 0.82 0.81 0.74  Sodium 135 - 145 mmol/L 140 141 140  Potassium 3.5 - 5.1 mmol/L 4.2 4.0 3.9  Chloride 98 - 111 mmol/L 107 108 105  CO2 22 - 32 mmol/L 25 25 25   Calcium 8.9 - 10.3 mg/dL 9.4 9.4 9.9  Total Protein 6.5 - 8.1 g/dL 7.2 6.9 6.7  Total Bilirubin 0.3 - 1.2 mg/dL 0.4 0.4 0.3  Alkaline Phos 38 - 126 U/L 94 85 83  AST 15 - 41 U/L 19 17 15   ALT 0 - 44 U/L 9 9 8      Component     Latest Ref Rng & Units 08/29/2017  Retic Ct Pct     0.7 - 2.1 % 0.8  RBC.     3.70 - 5.45 MIL/uL 4.60  Retic Count, Absolute     33.7 - 90.7 K/uL 36.8  Sed Rate     0 - 22 mm/hr 30 (H)  LDH     125 - 245 U/L 179  HCV Ab     0.0 - 0.9 s/co ratio <0.1  Hepatitis B Surface Ag     Negative Negative  Hep B Core Ab, Tot     Negative Negative     08/12/17 Lymph Node Needle/core Biopsy:   08/12/17 Tissue Flow Cytometry:    09/22/17 Tissue Flow Cytometry    09/22/17 Lymph Node Pathology:   RADIOGRAPHIC STUDIES: I have personally reviewed the radiological images as listed and agreed with the findings in the report. No results found.  ASSESSMENT & PLAN:   73 y.o. female with  1. Stage IA Hodgkin's Lymphoma (mixed cellularity) No constitutional symptoms Sed rate elevated to 30 PET findings most consistent with Stage I disease; discussed that a small hypermetabolic nodule found in her liver has remain unchanged for 4 years and is not considered to be involved with her Hodgkin's lymphoma.  -Left cervical lymph nodes are involved without further involvement.  01/15/18 PET/CT  revealed Interval complete response of left cervical hypermetabolic lymphadenopathy, now [place] 2. Stable 1.9 cm  hypermetabolic retroperitoneal soft tissue nodule in the posterior right upper quadrant. Indeterminate. Tiny left groin lymph nodes are unchanged in size since prior PET-CT and 1 of these demonstrates a clear central fatty hilum. Both are FDG avid on today's exam (not seen previously). Given response of cervical disease, these features may reflect reactive etiology and attention on follow-up suggested. Stable fusiform descending thoracic aortic aneurysm. Follow-up CT in 1 year could be used to reassess.   PLAN -Discussed pt labwork today, 07/23/18; CBC and CMP both WNL -The pt shows no clinical or lab progression/return of Hodgkin's Lymphoma at this time.  -No indication for further treatment at this time.   -Advised that she will follow up with her PCP regarding her Pneumovax and Prevnar injection in late April, 2020.  -Follow up in 3 months   All of the patients questions were answered with apparent satisfaction. The patient knows to call the clinic with any problems, questions or concerns.  The total time spent in the appt was 20 minutes and more than 50% was on counseling and direct patient cares.    Sullivan Lone MD [place] AAHIVMS [facility] Hematology/Oncology Physician [place]  [place]  (Office):       [phone] (Work cell):  [phone] (Fax):           [phone]  07/23/2018 10:58 AM  I, Soijett Blue am acting as scribe for Dr. Sullivan Lone.  .I have reviewed the above documentation for accuracy and completeness, and I agree with the above. Brunetta Genera MD

## 2018-07-23 ENCOUNTER — Telehealth: Payer: Self-pay | Admitting: Hematology

## 2018-07-23 ENCOUNTER — Inpatient Hospital Stay: Payer: Medicare HMO | Admitting: Hematology

## 2018-07-23 ENCOUNTER — Inpatient Hospital Stay: Payer: Medicare HMO | Attending: Hematology

## 2018-07-23 ENCOUNTER — Telehealth: Payer: Self-pay | Admitting: *Deleted

## 2018-07-23 VITALS — BP 150/78 | HR 85 | Temp 97.9°F | Resp 18 | Ht 68.0 in | Wt 199.8 lb

## 2018-07-23 DIAGNOSIS — Z87891 Personal history of nicotine dependence: Secondary | ICD-10-CM | POA: Insufficient documentation

## 2018-07-23 DIAGNOSIS — C8121 Mixed cellularity classical Hodgkin lymphoma, lymph nodes of head, face, and neck: Secondary | ICD-10-CM

## 2018-07-23 DIAGNOSIS — Z79899 Other long term (current) drug therapy: Secondary | ICD-10-CM | POA: Insufficient documentation

## 2018-07-23 LAB — CMP (CANCER CENTER ONLY)
ALBUMIN: 3.8 g/dL (ref 3.5–5.0)
ALT: 9 U/L (ref 0–44)
AST: 19 U/L (ref 15–41)
Alkaline Phosphatase: 94 U/L (ref 38–126)
Anion gap: 8 (ref 5–15)
BILIRUBIN TOTAL: 0.4 mg/dL (ref 0.3–1.2)
BUN: 11 mg/dL (ref 8–23)
CHLORIDE: 107 mmol/L (ref 98–111)
CO2: 25 mmol/L (ref 22–32)
CREATININE: 0.82 mg/dL (ref 0.44–1.00)
Calcium: 9.4 mg/dL (ref 8.9–10.3)
GFR, Est AFR Am: >60 mL/min (ref >60)
GLUCOSE: 92 mg/dL (ref 70–99)
POTASSIUM: 4.2 mmol/L (ref 3.5–5.1)
Sodium: 140 mmol/L (ref 135–145)
TOTAL PROTEIN: 7.2 g/dL (ref 6.5–8.1)

## 2018-07-23 LAB — CBC WITH DIFFERENTIAL/PLATELET
ABS IMMATURE GRANULOCYTES: 0.01 10*3/uL (ref 0.00–0.07)
BASOS ABS: 0.1 10*3/uL (ref 0.0–0.1)
Basophils Relative: 1 %
Eosinophils Absolute: 0.1 10*3/uL (ref 0.0–0.5)
Eosinophils Relative: 2 %
HEMATOCRIT: 38.5 % (ref 36.0–46.0)
Hemoglobin: 12.2 g/dL (ref 12.0–15.0)
IMMATURE GRANULOCYTES: 0 %
LYMPHS ABS: 1.5 10*3/uL (ref 0.7–4.0)
Lymphocytes Relative: 22 %
MCH: 27.4 pg (ref 26.0–34.0)
MCHC: 31.7 g/dL (ref 30.0–36.0)
MCV: 86.5 fL (ref 80.0–100.0)
MONOS PCT: 8 %
Monocytes Absolute: 0.6 10*3/uL (ref 0.1–1.0)
NEUTROS ABS: 4.4 10*3/uL (ref 1.7–7.7)
NEUTROS PCT: 67 %
NRBC: 0 % (ref 0.0–0.2)
Platelets: 294 10*3/uL (ref 150–400)
RBC: 4.45 MIL/uL (ref 3.87–5.11)
RDW: 14.3 % (ref 11.5–15.5)
WBC: 6.5 10*3/uL (ref 4.0–10.5)

## 2018-07-23 LAB — SEDIMENTATION RATE: SED RATE: 19 mm/h (ref 0–22)

## 2018-07-23 NOTE — Telephone Encounter (Signed)
No note

## 2018-07-23 NOTE — Telephone Encounter (Signed)
Scheduled appt  Per 01/30 los.  Printed avs per patient request.

## 2018-07-23 NOTE — Telephone Encounter (Signed)
Updated patient's immunization list, per patient's request

## 2018-07-28 DIAGNOSIS — B029 Zoster without complications: Secondary | ICD-10-CM | POA: Diagnosis not present

## 2018-07-28 DIAGNOSIS — E559 Vitamin D deficiency, unspecified: Secondary | ICD-10-CM | POA: Diagnosis not present

## 2018-07-28 DIAGNOSIS — E538 Deficiency of other specified B group vitamins: Secondary | ICD-10-CM | POA: Diagnosis not present

## 2018-08-07 DIAGNOSIS — H10412 Chronic giant papillary conjunctivitis, left eye: Secondary | ICD-10-CM | POA: Diagnosis not present

## 2018-10-22 ENCOUNTER — Other Ambulatory Visit: Payer: Medicare HMO

## 2018-10-22 ENCOUNTER — Ambulatory Visit: Payer: Medicare HMO | Admitting: Hematology

## 2018-10-30 DIAGNOSIS — K5901 Slow transit constipation: Secondary | ICD-10-CM | POA: Diagnosis not present

## 2018-10-30 DIAGNOSIS — Z8579 Personal history of other malignant neoplasms of lymphoid, hematopoietic and related tissues: Secondary | ICD-10-CM | POA: Diagnosis not present

## 2018-10-30 DIAGNOSIS — E559 Vitamin D deficiency, unspecified: Secondary | ICD-10-CM | POA: Diagnosis not present

## 2018-10-30 DIAGNOSIS — Z0000 Encounter for general adult medical examination without abnormal findings: Secondary | ICD-10-CM | POA: Diagnosis not present

## 2018-10-30 DIAGNOSIS — H918X2 Other specified hearing loss, left ear: Secondary | ICD-10-CM | POA: Diagnosis not present

## 2018-11-24 NOTE — Progress Notes (Signed)
HEMATOLOGY/ONCOLOGY CLINIC NOTE  Date of Service: 11/25/18    Patient Care Team: Kathyrn Lass, MD as PCP - General (Family Medicine)  CHIEF COMPLAINTS/PURPOSE OF CONSULTATION:  F/u for continued mx of  Hodgkin's Lymphoma   HISTORY OF PRESENTING ILLNESS:   Jennifer Juarez is a wonderful 73 y.o. female who has been referred to [place] by Dr. Lattie Haw Miller/Courtney Rolland Porter PA_C  for evaluation and management of likely newly diagnosed Hodgkin's Lymphoma.   The pt reports that she is doing very well overall and notes that she is fully functional and does not have any significant chronic medical problems or any functional limitations.  She notes that on 06/26/17 she noticed a knot on the left side of her neck that subsequently precipitated her CT scan and Bx prior to seeing [place] today.   On 07/28/17 the pt had a CT Soft Tissue Neck revealing Enlarged lymph nodes in the left neck compatible with neoplasm.Enlarged lymph nodes in the left neck. Large left level 2 lymph node 23 x 30 mm. Multiple posterior lymph nodes are present measuring up to 10 mm. No enlarged lymph nodes in the right neck. Biopsy recommended. Attention left tonsil on direct mucosal inspection. Possible tonsillar carcinoma on the left. Lymphoma also in the differential.    Of note prior to the patient's visit, pt has had a Lymph node Needle/Core biopsy completed on 08/12/17 with results revealing ATYPICAL LYMPHOID PROLIFERATION. Immunohistochemical stains were performed including LCA, CD20, PAX-5, CD3, CD15, and CD30 with appropriate controls. The large atypical lymphoid-appearing cells are positive for CD30, CD15 and PAX-5 and negative for CD3, CD20 and LCA. The lymphocytic population in the background show a mixture of T and B-cells with predominance of T-cells. The overall findings are limited but atypical and worrisome for a lymphoproliferative process, particularly. classical Hodgkin lymphoma. Excisional biopsy is strongly recommended.   Also on 08/12/17 the pt had a Tissue Flow Cytometry revealing NO MONOCLONAL B-CELL POPULATION OR ABNORMAL T-CELL PHENOTYPE IDENTIFIED.   Most recent CBC with Diff. results (07/15/17) revealed all values WNL.   She notes that she has had a viral infection in 2005 that resulted in a complete loss of hearing in her right ear. She notes that her left ear has had fluid build up and has resulted in significant loss of hearing, for which she uses hearing aids.  On review of systems, pt reports good energy levels, and denies fevers, chills, night sweats, unexpected weight loss, skin itching, rashes, soreness, sore throat, difficulties swallowing, back pains, abdominal pains, leg swelling, and any other symptoms.   On PMHx the pt reports arthritis, vertigo, polyps. She denies heart, lung, kidney or liver problems. She denies DM.  On Social Hx the pt notes having quit smoking when she was 1. She reports infrequent EOH consumption. She denies chemical or radiation exposure.  On Family Hx she reports that her mother had liver cancer. Her sister had breast cancer at 24 y/o and pancreatic cancer at 75 y/o.  Interval History:   Jennifer Juarez is here for a scheduled follow-up of her classical Hodgkin's Lymphoma. The patient's last visit with [place] was on 07/23/18. The pt reports that she is doing well overall.  The pt reports that she has had more severe seasonal allergies this year. She notes that she has had good energy levels, has been eating well, has gained some weight and notes that she has not developed any new concerns. She denies noticing any new lumps or bumps, skin rashes, or  having any fevers, chills, or night sweats.  Lab results today (11/25/18) of CBC w/diff and CMP is as follows: all values are WNL. 11/25/18 Sed Rate is 15  On review of systems, pt reports eating well, weight gain, good energy levels, and denies new lumps or bumps, fevers, chills, night sweats, unexpected weight loss, concerns for infections,  skin rashes, pain along the spine, abdominal pains, leg swelling, calf pain, and any other symptoms.  MEDICAL HISTORY:  Past Medical History:  Diagnosis Date  . BPPV (benign paroxysmal positional vertigo)   . Cancer ([place])   . H/O seasonal allergies   . Hearing loss in right ear   . Left cervical lymphadenopathy   . Macular degeneration   . Obesity   . Osteopenia   . Pre-syncope 11/09/2017  . Retinal vein occlusion   . Vitamin D deficiency     SURGICAL HISTORY: Past Surgical History:  Procedure Laterality Date  . CATARACT EXTRACTION, BILATERAL    . CHOLECYSTECTOMY    . IR FLUORO GUIDE PORT INSERTION RIGHT  10/10/2017  . IR REMOVAL TUN ACCESS W/ PORT W/O FL MOD SED  02/05/2018  . IR US GUIDE VASC ACCESS RIGHT  10/10/2017  . MASS EXCISION Left 09/22/2017   Procedure: LEFT CERVICAL LYMPH NODE EXCISION;  Surgeon: Izora Gala, MD;  Location: [facility];  Service: ENT;  Laterality: Left;  . TUBAL LIGATION      SOCIAL HISTORY: Social History   Socioeconomic History  . Marital status: Single    Spouse name: Not on file  . Number of children: Not on file  . Years of education: Not on file  . Highest education level: Not on file  Occupational History  . Not on file  Social Needs  . Financial resource strain: Not on file  . Food insecurity:    Worry: Not on file    Inability: Not on file  . Transportation needs:    Medical: Not on file    Non-medical: Not on file  Tobacco Use  . Smoking status: Former Smoker    Last attempt to quit: 06/24/1988    Years since quitting: 30.4  . Smokeless tobacco: Never Used  Substance and Sexual Activity  . Alcohol use: No  . Drug use: No  . Sexual activity: Not on file  Lifestyle  . Physical activity:    Days per week: Not on file    Minutes per session: Not on file  . Stress: Not on file  Relationships  . Social connections:    Talks on phone: Not on file    Gets together: Not on file    Attends religious service: Not  on file    Active member of club or organization: Not on file    Attends meetings of clubs or organizations: Not on file    Relationship status: Not on file  . Intimate partner violence:    Fear of current or ex partner: Not on file    Emotionally abused: Not on file    Physically abused: Not on file    Forced sexual activity: Not on file  Other Topics Concern  . Not on file  Social History Narrative  . Not on file    FAMILY HISTORY: Family History  Problem Relation Age of Onset  . Cancer Mother        liver  . Heart attack Father   . Hypertension Father   . Diabetes Mellitus I Father   . Cancer Sister  pancreatic, breast  . Diabetes Mellitus I Sister   . Heart disease Brother   . Heart disease Brother   . Heart disease Brother   . Diabetes Mellitus I Sister   . Diabetes Mellitus I Sister     ALLERGIES:  is allergic to cephalexin; doxycycline; levofloxacin; prilosec [omeprazole]; and singulair [montelukast].  MEDICATIONS:  Current Outpatient Medications  Medication Sig Dispense Refill  . acetaminophen (TYLENOL) 500 MG tablet Take 500 mg by mouth every 8 (eight) hours as needed for mild pain or moderate pain.     Marland Kitchen azithromycin (ZITHROMAX Z-PAK) 250 MG tablet Take 2 tablets by mouth on day one, then take 1 tablet daily for 4 days. 6 each 0  . dexamethasone (DECADRON) 4 MG tablet Take 2 tablets by mouth once a day on the day after chemotherapy and then take 2 tablets two times a day for 2 days. Take with food. (Patient not taking: Reported on 04/22/2018) 30 tablet 1  . lidocaine-prilocaine (EMLA) cream Apply to affected area once (Patient taking differently: Apply 1 application topically daily as needed. Port) 30 g 3  . loratadine (CLARITIN) 10 MG tablet Take 10 mg by mouth daily.    Marland Kitchen LORazepam (ATIVAN) 0.5 MG tablet Take 1 tablet (0.5 mg total) by mouth every 8 (eight) hours as needed for anxiety (nausea). 30 tablet 0  . ondansetron (ZOFRAN) 8 MG tablet Take 1 tablet  (8 mg total) by mouth 2 (two) times daily as needed. Start on the third day after chemotherapy. 30 tablet 1  . polyethylene glycol (MIRALAX / GLYCOLAX) packet Take 17 g by mouth daily.    . prochlorperazine (COMPAZINE) 10 MG tablet Take 1 tablet (10 mg total) by mouth every 6 (six) hours as needed (Nausea or vomiting). (Patient not taking: Reported on 04/22/2018) 30 tablet 1  . promethazine (PHENERGAN) 25 MG suppository Place 1 suppository (25 mg total) rectally every 6 (six) hours as needed for nausea or vomiting. 12 suppository 1  . triamcinolone (NASACORT AQ) 55 MCG/ACT AERO nasal inhaler Place 2 sprays into the nose daily as needed (allergies).      No current facility-administered medications for this visit.     REVIEW OF SYSTEMS:    A 10+ POINT REVIEW OF SYSTEMS WAS OBTAINED including neurology, dermatology, psychiatry, cardiac, respiratory, lymph, extremities, GI, GU, Musculoskeletal, constitutional, breasts, reproductive, HEENT.  All pertinent positives are noted in the HPI.  All others are negative.   PHYSICAL EXAMINATION: ECOG PERFORMANCE STATUS: 1 BP 140/73 (BP Location: Left Arm, Patient Position: Sitting)   Pulse 79   Temp 98.4 F (36.9 C) (Oral)   Resp 18   Ht 5\' 8"  (1.727 m)   Wt 205 lb 8 oz (93.2 kg)   SpO2 97%   BMI 31.25 kg/m   GENERAL:alert, in no acute distress and comfortable SKIN: no acute rashes, no significant lesions EYES: conjunctiva are pink and non-injected, sclera anicteric OROPHARYNX: MMM, no exudates, no oropharyngeal erythema or ulceration NECK: supple, no JVD LYMPH:  no palpable lymphadenopathy in the cervical, axillary or inguinal regions LUNGS: clear to auscultation b/l with normal respiratory effort HEART: regular rate & rhythm ABDOMEN:  normoactive bowel sounds , non tender, not distended. No palpable hepatosplenomegaly.  Extremity: no pedal edema PSYCH: alert & oriented x 3 with fluent speech NEURO: no focal motor/sensory deficits    LABORATORY DATA:  I have reviewed the data as listed  . CBC Latest Ref Rng & Units 11/25/2018 07/23/2018 04/22/2018  WBC 4.0 -  10.5 K/uL 7.5 6.5 7.0  Hemoglobin 12.0 - 15.0 g/dL 12.4 12.2 12.3  Hematocrit 36.0 - 46.0 % 39.2 38.5 38.8  Platelets 150 - 400 K/uL 299 294 309   . CMP Latest Ref Rng & Units 11/25/2018 07/23/2018 04/22/2018  Glucose 70 - 99 mg/dL 89 92 95  BUN 8 - 23 mg/dL 10 11 11   Creatinine 0.44 - 1.00 mg/dL 0.85 0.82 0.81  Sodium 135 - 145 mmol/L 139 140 141  Potassium 3.5 - 5.1 mmol/L 4.1 4.2 4.0  Chloride 98 - 111 mmol/L 106 107 108  CO2 22 - 32 mmol/L 24 25 25   Calcium 8.9 - 10.3 mg/dL 9.3 9.4 9.4  Total Protein 6.5 - 8.1 g/dL 7.4 7.2 6.9  Total Bilirubin 0.3 - 1.2 mg/dL 0.3 0.4 0.4  Alkaline Phos 38 - 126 U/L 87 94 85  AST 15 - 41 U/L 19 19 17   ALT 0 - 44 U/L 9 9 9        08/12/17 Lymph Node Needle/core Biopsy:   08/12/17 Tissue Flow Cytometry:    09/22/17 Tissue Flow Cytometry    09/22/17 Lymph Node Pathology:   RADIOGRAPHIC STUDIES: I have personally reviewed the radiological images as listed and agreed with the findings in the report. No results found.  ASSESSMENT & PLAN:   73 y.o. female with  1. Stage IA Hodgkin's Lymphoma (mixed cellularity) No constitutional symptoms Sed rate elevated to 30 PET findings most consistent with Stage I disease; discussed that a small hypermetabolic nodule found in her liver has remain unchanged for 4 years and is not considered to be involved with her Hodgkin's lymphoma.  -Left cervical lymph nodes are involved without further involvement.  01/15/18 PET/CT  revealed Interval complete response of left cervical hypermetabolic lymphadenopathy, now [place] 2. Stable 1.9 cm hypermetabolic retroperitoneal soft tissue nodule in the posterior right upper quadrant. Indeterminate. Tiny left groin lymph nodes are unchanged in size since prior PET-CT and 1 of these demonstrates a clear central fatty hilum. Both are FDG avid on  today's exam (not seen previously). Given response of cervical disease, these features may reflect reactive etiology and attention on follow-up suggested. Stable fusiform descending thoracic aortic aneurysm. Follow-up CT in 1 year could be used to reassess.   PLAN -Discussed pt labwork today, 11/25/18; blood counts and chemistries are normal -11/25/18 Sed rate is pending -The pt shows no clinical or lab progression/return of her Hodgkin's lymphoma at this time.  -No indication for further treatment at this time.  -Recommended that the pt continue to eat well, drink at least 48-64 oz of water each day, and walk 20-30 minutes each day. -Advised that she will follow up with her PCP regarding her Pneumovax and Prevnar injections. Continue with annual flu vaccine. -Will see the pt back in 4 months   RTC with Dr Irene Limbo with labs in 4 months   All of the patients questions were answered with apparent satisfaction. The patient knows to call the clinic with any problems, questions or concerns.  The total time spent in the appt was 15 minutes and more than 50% was on counseling and direct patient cares.    Sullivan Lone MD MS AAHIVMS [facility] Hematology/Oncology Physician [facility]  (Office):       [phone] (Work cell):  [phone] (Fax):           [phone]  11/25/2018 2:24 PM  I, Baldwin [place], am acting as a scribe for Dr. Sullivan Lone.   Marland KitchenI  have reviewed the above documentation for accuracy and completeness, and I agree with the above. Brunetta Genera MD

## 2018-11-25 ENCOUNTER — Inpatient Hospital Stay: Payer: Medicare HMO | Attending: Hematology

## 2018-11-25 ENCOUNTER — Other Ambulatory Visit: Payer: Self-pay

## 2018-11-25 ENCOUNTER — Telehealth: Payer: Self-pay | Admitting: Hematology

## 2018-11-25 ENCOUNTER — Inpatient Hospital Stay: Payer: Medicare HMO | Admitting: Hematology

## 2018-11-25 VITALS — BP 140/73 | HR 79 | Temp 98.4°F | Resp 18 | Ht 68.0 in | Wt 205.5 lb

## 2018-11-25 DIAGNOSIS — Z87891 Personal history of nicotine dependence: Secondary | ICD-10-CM

## 2018-11-25 DIAGNOSIS — Z8572 Personal history of non-Hodgkin lymphomas: Secondary | ICD-10-CM

## 2018-11-25 DIAGNOSIS — C8121 Mixed cellularity classical Hodgkin lymphoma, lymph nodes of head, face, and neck: Secondary | ICD-10-CM

## 2018-11-25 LAB — CMP (CANCER CENTER ONLY)
ALT: 9 U/L (ref 0–44)
AST: 19 U/L (ref 15–41)
Albumin: 3.9 g/dL (ref 3.5–5.0)
Alkaline Phosphatase: 87 U/L (ref 38–126)
Anion gap: 9 (ref 5–15)
BUN: 10 mg/dL (ref 8–23)
CO2: 24 mmol/L (ref 22–32)
Calcium: 9.3 mg/dL (ref 8.9–10.3)
Chloride: 106 mmol/L (ref 98–111)
Creatinine: 0.85 mg/dL (ref 0.44–1.00)
GFR, Est AFR Am: >60 mL/min (ref >60)
GFR, Estimated: >60 mL/min (ref >60)
Glucose, Bld: 89 mg/dL (ref 70–99)
Potassium: 4.1 mmol/L (ref 3.5–5.1)
Sodium: 139 mmol/L (ref 135–145)
Total Bilirubin: 0.3 mg/dL (ref 0.3–1.2)
Total Protein: 7.4 g/dL (ref 6.5–8.1)

## 2018-11-25 LAB — CBC WITH DIFFERENTIAL/PLATELET
Abs Immature Granulocytes: 0.02 10*3/uL (ref 0.00–0.07)
Basophils Absolute: 0.1 10*3/uL (ref 0.0–0.1)
Basophils Relative: 1 %
Eosinophils Absolute: 0.1 10*3/uL (ref 0.0–0.5)
Eosinophils Relative: 2 %
HCT: 39.2 % (ref 36.0–46.0)
Hemoglobin: 12.4 g/dL (ref 12.0–15.0)
Immature Granulocytes: 0 %
Lymphocytes Relative: 23 %
Lymphs Abs: 1.7 10*3/uL (ref 0.7–4.0)
MCH: 27.8 pg (ref 26.0–34.0)
MCHC: 31.6 g/dL (ref 30.0–36.0)
MCV: 87.9 fL (ref 80.0–100.0)
Monocytes Absolute: 0.5 10*3/uL (ref 0.1–1.0)
Monocytes Relative: 7 %
Neutro Abs: 5.1 10*3/uL (ref 1.7–7.7)
Neutrophils Relative %: 67 %
Platelets: 299 10*3/uL (ref 150–400)
RBC: 4.46 MIL/uL (ref 3.87–5.11)
RDW: 13.7 % (ref 11.5–15.5)
WBC: 7.5 10*3/uL (ref 4.0–10.5)
nRBC: 0 % (ref 0.0–0.2)

## 2018-11-25 LAB — SEDIMENTATION RATE: Sed Rate: 15 mm/hr (ref 0–22)

## 2018-11-25 NOTE — Telephone Encounter (Signed)
Scheduled appt pet 6/3 los. A calendar will be mailed out.

## 2019-01-29 DIAGNOSIS — R05 Cough: Secondary | ICD-10-CM | POA: Diagnosis not present

## 2019-02-23 ENCOUNTER — Other Ambulatory Visit: Payer: Self-pay | Admitting: Family Medicine

## 2019-02-23 DIAGNOSIS — Z1231 Encounter for screening mammogram for malignant neoplasm of breast: Secondary | ICD-10-CM

## 2019-03-25 DIAGNOSIS — R69 Illness, unspecified: Secondary | ICD-10-CM | POA: Diagnosis not present

## 2019-03-28 NOTE — Progress Notes (Signed)
HEMATOLOGY/ONCOLOGY CLINIC NOTE  Date of Service: 03/29/19    Patient Care Team: Kathyrn Lass, MD as PCP - General (Family Medicine)  CHIEF COMPLAINTS/PURPOSE OF CONSULTATION:  F/u for continued mx of  Hodgkin's Lymphoma   HISTORY OF PRESENTING ILLNESS:   Jennifer Juarez is a wonderful 73 y.o. female who has been referred to [place] by Dr. Lattie Haw Miller/Courtney Rolland Porter PA_C  for evaluation and management of likely newly diagnosed Hodgkin's Lymphoma.   The pt reports that she is doing very well overall and notes that she is fully functional and does not have any significant chronic medical problems or any functional limitations.  She notes that on 06/26/17 she noticed a knot on the left side of her neck that subsequently precipitated her CT scan and Bx prior to seeing [place] today.   On 07/28/17 the pt had a CT Soft Tissue Neck revealing Enlarged lymph nodes in the left neck compatible with neoplasm.Enlarged lymph nodes in the left neck. Large left level 2 lymph node 23 x 30 mm. Multiple posterior lymph nodes are present measuring up to 10 mm. No enlarged lymph nodes in the right neck. Biopsy recommended. Attention left tonsil on direct mucosal inspection. Possible tonsillar carcinoma on the left. Lymphoma also in the differential.    Of note prior to the patient's visit, pt has had a Lymph node Needle/Core biopsy completed on 08/12/17 with results revealing ATYPICAL LYMPHOID PROLIFERATION. Immunohistochemical stains were performed including LCA, CD20, PAX-5, CD3, CD15, and CD30 with appropriate controls. The large atypical lymphoid-appearing cells are positive for CD30, CD15 and PAX-5 and negative for CD3, CD20 and LCA. The lymphocytic population in the background show a mixture of T and B-cells with predominance of T-cells. The overall findings are limited but atypical and worrisome for a lymphoproliferative process, particularly. classical Hodgkin lymphoma. Excisional biopsy is strongly recommended.   Also on 08/12/17 the pt had a Tissue Flow Cytometry revealing NO MONOCLONAL B-CELL POPULATION OR ABNORMAL T-CELL PHENOTYPE IDENTIFIED.   Most recent CBC with Diff. results (07/15/17) revealed all values WNL.   She notes that she has had a viral infection in 2005 that resulted in a complete loss of hearing in her right ear. She notes that her left ear has had fluid build up and has resulted in significant loss of hearing, for which she uses hearing aids.  On review of systems, pt reports good energy levels, and denies fevers, chills, night sweats, unexpected weight loss, skin itching, rashes, soreness, sore throat, difficulties swallowing, back pains, abdominal pains, leg swelling, and any other symptoms.   On PMHx the pt reports arthritis, vertigo, polyps. She denies heart, lung, kidney or liver problems. She denies DM.  On Social Hx the pt notes having quit smoking when she was 34. She reports infrequent EOH consumption. She denies chemical or radiation exposure.  On Family Hx she reports that her mother had liver cancer. Her sister had breast cancer at 24 y/o and pancreatic cancer at 75 y/o.  Interval History:   [name] is here for a scheduled follow-up of her classical Hodgkin's Lymphoma. The patient's last visit with [place] was on 11/25/2018. The pt reports that she is doing well overall.  The pt reports that she has been doing well and is only going out in public to get groceries. She has been trying to get out and walk more. Pt was experiencing acid reflux, characterized by a dry cough. She was given Pepcid and her symptoms have since subsided.  Lab results today (03/29/19) of CBC w/diff and CMP is as follows: all values are WNL.  03/29/2019 Sed Rate at 23  On review of systems, pt denies fevers, chills, night sweats, leg swelling, abdominal pain, new lumps/bumps and any other symptoms.   MEDICAL HISTORY:  Past Medical History:  Diagnosis Date  . BPPV (benign paroxysmal positional  vertigo)   . Cancer ([place])   . H/O seasonal allergies   . Hearing loss in right ear   . Left cervical lymphadenopathy   . Macular degeneration   . Obesity   . Osteopenia   . Pre-syncope 11/09/2017  . Retinal vein occlusion   . Vitamin D deficiency     SURGICAL HISTORY: Past Surgical History:  Procedure Laterality Date  . CATARACT EXTRACTION, BILATERAL    . CHOLECYSTECTOMY    . IR FLUORO GUIDE PORT INSERTION RIGHT  10/10/2017  . IR REMOVAL TUN ACCESS W/ PORT W/O FL MOD SED  02/05/2018  . IR US GUIDE VASC ACCESS RIGHT  10/10/2017  . MASS EXCISION Left 09/22/2017   Procedure: LEFT CERVICAL LYMPH NODE EXCISION;  Surgeon: Izora Gala, MD;  Location: [facility];  Service: ENT;  Laterality: Left;  . TUBAL LIGATION      SOCIAL HISTORY: Social History   Socioeconomic History  . Marital status: Single    Spouse name: Not on file  . Number of children: Not on file  . Years of education: Not on file  . Highest education level: Not on file  Occupational History  . Not on file  Social Needs  . Financial resource strain: Not on file  . Food insecurity    Worry: Not on file    Inability: Not on file  . Transportation needs    Medical: Not on file    Non-medical: Not on file  Tobacco Use  . Smoking status: Former Smoker    Quit date: 06/24/1988    Years since quitting: 30.7  . Smokeless tobacco: Never Used  Substance and Sexual Activity  . Alcohol use: No  . Drug use: No  . Sexual activity: Not on file  Lifestyle  . Physical activity    Days per week: Not on file    Minutes per session: Not on file  . Stress: Not on file  Relationships  . Social Herbalist on phone: Not on file    Gets together: Not on file    Attends religious service: Not on file    Active member of club or organization: Not on file    Attends meetings of clubs or organizations: Not on file    Relationship status: Not on file  . Intimate partner violence    Fear of current or ex  partner: Not on file    Emotionally abused: Not on file    Physically abused: Not on file    Forced sexual activity: Not on file  Other Topics Concern  . Not on file  Social History Narrative  . Not on file    FAMILY HISTORY: Family History  Problem Relation Age of Onset  . Cancer Mother        liver  . Heart attack Father   . Hypertension Father   . Diabetes Mellitus I Father   . Cancer Sister        pancreatic, breast  . Diabetes Mellitus I Sister   . Heart disease Brother   . Heart disease Brother   . Heart disease Brother   .  Diabetes Mellitus I Sister   . Diabetes Mellitus I Sister     ALLERGIES:  is allergic to cephalexin; doxycycline; levofloxacin; prilosec [omeprazole]; and singulair [montelukast].  MEDICATIONS:  Current Outpatient Medications  Medication Sig Dispense Refill  . acetaminophen (TYLENOL) 500 MG tablet Take 500 mg by mouth every 8 (eight) hours as needed for mild pain or moderate pain.     Marland Kitchen azithromycin (ZITHROMAX Z-PAK) 250 MG tablet Take 2 tablets by mouth on day one, then take 1 tablet daily for 4 days. 6 each 0  . dexamethasone (DECADRON) 4 MG tablet Take 2 tablets by mouth once a day on the day after chemotherapy and then take 2 tablets two times a day for 2 days. Take with food. (Patient not taking: Reported on 04/22/2018) 30 tablet 1  . lidocaine-prilocaine (EMLA) cream Apply to affected area once (Patient taking differently: Apply 1 application topically daily as needed. Port) 30 g 3  . loratadine (CLARITIN) 10 MG tablet Take 10 mg by mouth daily.    Marland Kitchen LORazepam (ATIVAN) 0.5 MG tablet Take 1 tablet (0.5 mg total) by mouth every 8 (eight) hours as needed for anxiety (nausea). 30 tablet 0  . ondansetron (ZOFRAN) 8 MG tablet Take 1 tablet (8 mg total) by mouth 2 (two) times daily as needed. Start on the third day after chemotherapy. 30 tablet 1  . polyethylene glycol (MIRALAX / GLYCOLAX) packet Take 17 g by mouth daily.    . prochlorperazine  (COMPAZINE) 10 MG tablet Take 1 tablet (10 mg total) by mouth every 6 (six) hours as needed (Nausea or vomiting). (Patient not taking: Reported on 04/22/2018) 30 tablet 1  . promethazine (PHENERGAN) 25 MG suppository Place 1 suppository (25 mg total) rectally every 6 (six) hours as needed for nausea or vomiting. 12 suppository 1  . triamcinolone (NASACORT AQ) 55 MCG/ACT AERO nasal inhaler Place 2 sprays into the nose daily as needed (allergies).      No current facility-administered medications for this visit.     REVIEW OF SYSTEMS:    A 10+ POINT REVIEW OF SYSTEMS WAS OBTAINED including neurology, dermatology, psychiatry, cardiac, respiratory, lymph, extremities, GI, GU, Musculoskeletal, constitutional, breasts, reproductive, HEENT.  All pertinent positives are noted in the HPI.  All others are negative.   PHYSICAL EXAMINATION: ECOG PERFORMANCE STATUS: 1 BP 140/67 (BP Location: Left Arm, Patient Position: Sitting)   Pulse 67   Temp 98.2 F (36.8 C) (Oral)   Resp 17   Ht 5\' 8"  (1.727 m)   Wt 209 lb (94.8 kg)   SpO2 99%   BMI 31.78 kg/m   GENERAL:alert, in no acute distress and comfortable SKIN: no acute rashes, no significant lesions EYES: conjunctiva are pink and non-injected, sclera anicteric OROPHARYNX: MMM, no exudates, no oropharyngeal erythema or ulceration NECK: supple, no JVD LYMPH:  no palpable lymphadenopathy in the cervical, axillary or inguinal regions LUNGS: clear to auscultation b/l with normal respiratory effort HEART: regular rate & rhythm ABDOMEN:  normoactive bowel sounds , non tender, not distended. No palpable hepatosplenomegaly.  Extremity: no pedal edema PSYCH: alert & oriented x 3 with fluent speech NEURO: no focal motor/sensory deficits  LABORATORY DATA:  I have reviewed the data as listed  . CBC Latest Ref Rng & Units 03/29/2019 11/25/2018 07/23/2018  WBC 4.0 - 10.5 K/uL 6.8 7.5 6.5  Hemoglobin 12.0 - 15.0 g/dL 12.3 12.4 12.2  Hematocrit 36.0 - 46.0 %  38.3 39.2 38.5  Platelets 150 - 400 K/uL 311 299 294   .  CMP Latest Ref Rng & Units 03/29/2019 11/25/2018 07/23/2018  Glucose 70 - 99 mg/dL 94 89 92  BUN 8 - 23 mg/dL 8 10 11   Creatinine 0.44 - 1.00 mg/dL 0.80 0.85 0.82  Sodium 135 - 145 mmol/L 140 139 140  Potassium 3.5 - 5.1 mmol/L 4.1 4.1 4.2  Chloride 98 - 111 mmol/L 107 106 107  CO2 22 - 32 mmol/L 25 24 25   Calcium 8.9 - 10.3 mg/dL 9.4 9.3 9.4  Total Protein 6.5 - 8.1 g/dL 7.3 7.4 7.2  Total Bilirubin 0.3 - 1.2 mg/dL 0.4 0.3 0.4  Alkaline Phos 38 - 126 U/L 92 87 94  AST 15 - 41 U/L 24 19 19   ALT 0 - 44 U/L 14 9 9    11/25/2018 Sed Rate    08/12/17 Lymph Node Needle/core Biopsy:   08/12/17 Tissue Flow Cytometry:    09/22/17 Tissue Flow Cytometry    09/22/17 Lymph Node Pathology:   RADIOGRAPHIC STUDIES: I have personally reviewed the radiological images as listed and agreed with the findings in the report. No results found.  ASSESSMENT & PLAN:   73 y.o. female with  1. Stage IA Hodgkin's Lymphoma (mixed cellularity) No constitutional symptoms Sed rate elevated to 30 PET findings most consistent with Stage I disease; discussed that a small hypermetabolic nodule found in her liver has remain unchanged for 4 years and is not considered to be involved with her Hodgkin's lymphoma.  -Left cervical lymph nodes are involved without further involvement.  01/15/18 PET/CT  revealed Interval complete response of left cervical hypermetabolic lymphadenopathy, now [place] 2. Stable 1.9 cm hypermetabolic retroperitoneal soft tissue nodule in the posterior right upper quadrant. Indeterminate. Tiny left groin lymph nodes are unchanged in size since prior PET-CT and 1 of these demonstrates a clear central fatty hilum. Both are FDG avid on today's exam (not seen previously). Given response of cervical disease, these features may reflect reactive etiology and attention on follow-up suggested. Stable fusiform descending thoracic aortic  aneurysm. Follow-up CT in 1 year could be used to reassess.   PLAN: -Discussed pt labwork today, 03/29/19; all values are WNL.  -Discussed 03/29/2019 Sed Rate at 23 -The pt shows no clinical or lab progression/return of her Hodgkin's lymphoma at this time.  -No indication for further treatment at this time.  -Advised that she will follow up with her PCP regarding her Pneumovax and Prevnar injections. Continue with annual flu vaccine. -Will see back in 4 months   FOLLOW UP: RTC with Dr Irene Limbo with labs in 4 months  The total time spent in the appt was 15 minutes and more than 50% was on counseling and direct patient cares.  All of the patient's questions were answered with apparent satisfaction. The patient knows to call the clinic with any problems, questions or concerns.    Sullivan Lone MD [place] AAHIVMS [facility] Hematology/Oncology Physician [facility]  (Office):       [phone] (Work cell):  [phone] (Fax):           [phone]  03/29/2019 4:32 PM  I, Yevette Edwards, am acting as a scribe for Dr. Sullivan Lone.   .I have reviewed the above documentation for accuracy and completeness, and I agree with the above. Brunetta Genera MD

## 2019-03-29 ENCOUNTER — Inpatient Hospital Stay: Payer: Medicare HMO | Attending: Hematology

## 2019-03-29 ENCOUNTER — Other Ambulatory Visit: Payer: Self-pay

## 2019-03-29 ENCOUNTER — Inpatient Hospital Stay: Payer: Medicare HMO | Admitting: Hematology

## 2019-03-29 VITALS — BP 140/67 | HR 67 | Temp 98.2°F | Resp 17 | Ht 68.0 in | Wt 209.0 lb

## 2019-03-29 DIAGNOSIS — C8121 Mixed cellularity classical Hodgkin lymphoma, lymph nodes of head, face, and neck: Secondary | ICD-10-CM | POA: Diagnosis not present

## 2019-03-29 DIAGNOSIS — K219 Gastro-esophageal reflux disease without esophagitis: Secondary | ICD-10-CM | POA: Insufficient documentation

## 2019-03-29 DIAGNOSIS — C8190 Hodgkin lymphoma, unspecified, unspecified site: Secondary | ICD-10-CM | POA: Diagnosis not present

## 2019-03-29 LAB — CMP (CANCER CENTER ONLY)
ALT: 14 U/L (ref 0–44)
AST: 24 U/L (ref 15–41)
Albumin: 3.8 g/dL (ref 3.5–5.0)
Alkaline Phosphatase: 92 U/L (ref 38–126)
Anion gap: 8 (ref 5–15)
BUN: 8 mg/dL (ref 8–23)
CO2: 25 mmol/L (ref 22–32)
Calcium: 9.4 mg/dL (ref 8.9–10.3)
Chloride: 107 mmol/L (ref 98–111)
Creatinine: 0.8 mg/dL (ref 0.44–1.00)
GFR, Est AFR Am: >60 mL/min (ref >60)
GFR, Estimated: >60 mL/min (ref >60)
Glucose, Bld: 94 mg/dL (ref 70–99)
Potassium: 4.1 mmol/L (ref 3.5–5.1)
Sodium: 140 mmol/L (ref 135–145)
Total Bilirubin: 0.4 mg/dL (ref 0.3–1.2)
Total Protein: 7.3 g/dL (ref 6.5–8.1)

## 2019-03-29 LAB — CBC WITH DIFFERENTIAL/PLATELET
Abs Immature Granulocytes: 0.01 10*3/uL (ref 0.00–0.07)
Basophils Absolute: 0.1 10*3/uL (ref 0.0–0.1)
Basophils Relative: 1 %
Eosinophils Absolute: 0.2 10*3/uL (ref 0.0–0.5)
Eosinophils Relative: 2 %
HCT: 38.3 % (ref 36.0–46.0)
Hemoglobin: 12.3 g/dL (ref 12.0–15.0)
Immature Granulocytes: 0 %
Lymphocytes Relative: 29 %
Lymphs Abs: 2 10*3/uL (ref 0.7–4.0)
MCH: 28.4 pg (ref 26.0–34.0)
MCHC: 32.1 g/dL (ref 30.0–36.0)
MCV: 88.5 fL (ref 80.0–100.0)
Monocytes Absolute: 0.6 10*3/uL (ref 0.1–1.0)
Monocytes Relative: 8 %
Neutro Abs: 4 10*3/uL (ref 1.7–7.7)
Neutrophils Relative %: 60 %
Platelets: 311 10*3/uL (ref 150–400)
RBC: 4.33 MIL/uL (ref 3.87–5.11)
RDW: 14.2 % (ref 11.5–15.5)
WBC: 6.8 10*3/uL (ref 4.0–10.5)
nRBC: 0 % (ref 0.0–0.2)

## 2019-03-29 LAB — SEDIMENTATION RATE: Sed Rate: 23 mm/hr — ABNORMAL HIGH (ref 0–22)

## 2019-03-30 ENCOUNTER — Telehealth: Payer: Self-pay | Admitting: Hematology

## 2019-03-30 NOTE — Telephone Encounter (Signed)
Scheduled appt per 10/5 los.  Sent a staf message and a calendar will mailed out.

## 2019-04-12 ENCOUNTER — Ambulatory Visit
Admission: RE | Admit: 2019-04-12 | Discharge: 2019-04-12 | Disposition: A | Discharge status: Home / Self Care | Payer: Medicare HMO | Source: Ambulatory Visit | Attending: Family Medicine | Admitting: Family Medicine

## 2019-04-12 ENCOUNTER — Other Ambulatory Visit: Payer: Self-pay

## 2019-04-12 DIAGNOSIS — Z1231 Encounter for screening mammogram for malignant neoplasm of breast: Secondary | ICD-10-CM

## 2019-04-22 IMAGING — CT CT NECK W/ CM
2 of 6 series · 5 of 20 positions shown, 6 images · IV contrast (iopamidol)
Comparison: None.

CLINICAL DATA: Neck mass

Creatinine was obtained on site at [HOSPITAL] at [REDACTED].
Results: Creatinine 0.8 mg/dL.
EXAM:
CT NECK WITH CONTRAST
TECHNIQUE: Multidetector CT imaging of the neck was performed using the
standard protocol following the bolus administration of intravenous
contrast.
CONTRAST:  75mL AEXKSY-666 IOPAMIDOL (AEXKSY-666) INJECTION 61%

[Series 201: coronal · coronal · 0.51mm/px · 3 of 114 slices shown]
[im 27/114  bone]
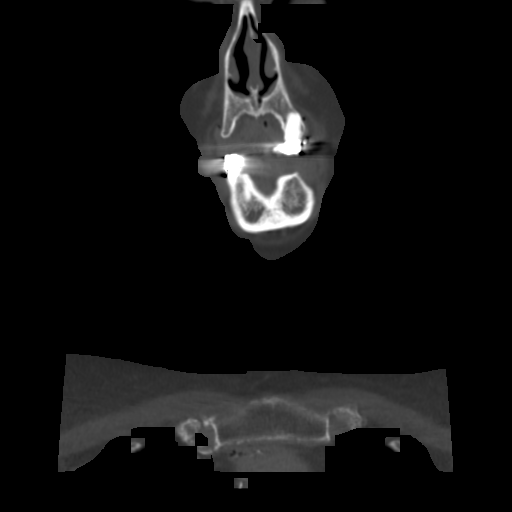
[im 47/114  bone]
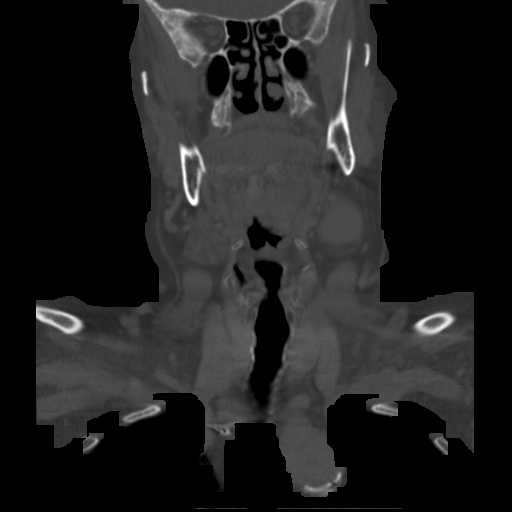
[im 67/114  bone]
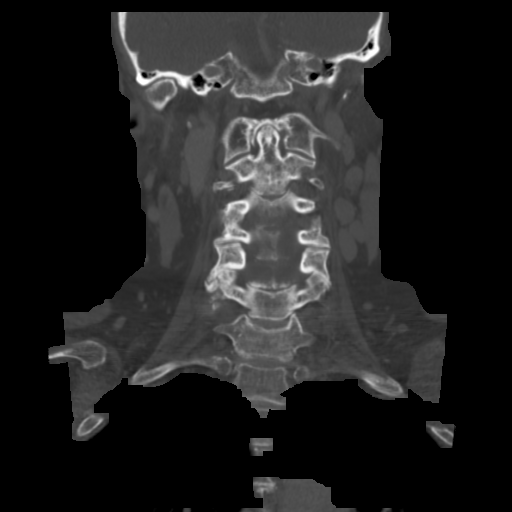

[Series 202: angled axial · axial · 0.51mm/px · z∈[+231,+310]mm · 2 of 125 slices shown, 3 images]
[im 42/125  soft-tissue]
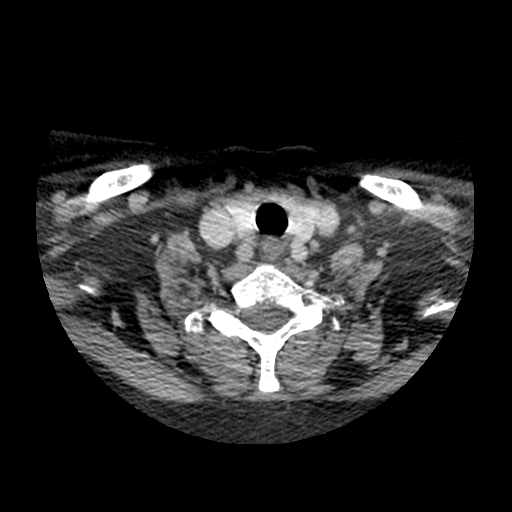
[im 42/125  bone]
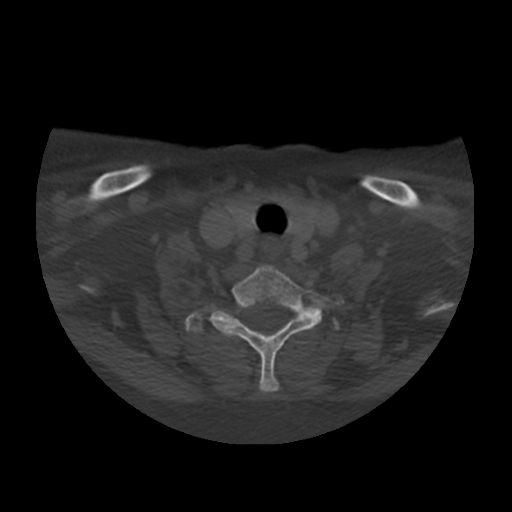
[im 83/125  bone]
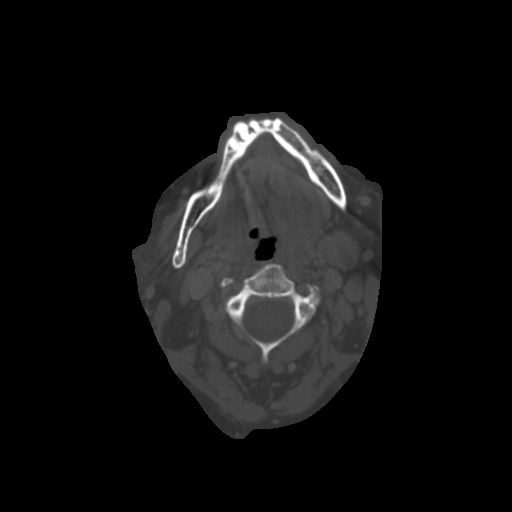

[5 of 20 positions shown; findings below may reference images not displayed]

FINDINGS: Pharynx and larynx: Prominent pharyngeal tonsils bilaterally left
greater than right. Neoplasm not excluded particularly given the
malignant adenopathy in the left neck. Base of tongue is normal.
Larynx normal

Salivary glands: No inflammation, mass, or stone.

Thyroid: Negative

Lymph nodes: Enlarged lymph nodes in the left neck. Large left level
2 lymph node 23 x 30 mm. Multiple posterior lymph nodes are present
measuring up to 10 mm. No enlarged lymph nodes in the right neck.

Vascular: Mild atherosclerotic disease of the carotid bifurcation
bilaterally otherwise negative

Limited intracranial: Negative

Visualized orbits: Bilateral cataract removal.  No orbital mass.

Mastoids and visualized paranasal sinuses: Negative

Skeleton: Cervical spine degenerative change. No acute skeletal
abnormality.

Upper chest: Lung apices clear

Other: None
IMPRESSION: Enlarged lymph nodes in the left neck compatible with neoplasm.
Biopsy recommended. Attention left tonsil on direct mucosal
inspection. Possible tonsillar carcinoma on the left. Lymphoma also
in the differential.

## 2019-06-08 DIAGNOSIS — K2100 Gastro-esophageal reflux disease with esophagitis, without bleeding: Secondary | ICD-10-CM | POA: Diagnosis not present

## 2019-06-08 DIAGNOSIS — M79671 Pain in right foot: Secondary | ICD-10-CM | POA: Diagnosis not present

## 2019-06-08 DIAGNOSIS — J3489 Other specified disorders of nose and nasal sinuses: Secondary | ICD-10-CM | POA: Diagnosis not present

## 2019-07-04 IMAGING — XA IR US GUIDE VASC ACCESS RIGHT
1 series · 1 of 1 positions shown · non-contrast
Comparison: none

CLINICAL DATA: Hodgkin's lymphoma. Needs durable venous access for
planned chemotherapy regimen.
TECHNIQUE: The procedure, risks, benefits, and alternatives were explained to
the patient. Questions regarding the procedure were encouraged and
answered. The patient understands and consents to the procedure. As
antibiotic prophylaxis, Cleocin 600 mg was ordered pre-procedure and
administered intravenously within one hour of incision. Patency of
the right IJ vein was confirmed with ultrasound with image
documentation. An appropriate skin site was determined. Skin site
was marked. Region was prepped using maximum barrier technique
including cap and mask, sterile gown, sterile gloves, large sterile
sheet, and Chlorhexidine as cutaneous antisepsis. The region was
infiltrated locally with 1% lidocaine. Under real-time ultrasound
guidance, the right IJ vein was accessed with a 21 gauge
micropuncture needle; the needle tip within the vein was confirmed
with ultrasound image documentation. Needle was exchanged over a 018
guidewire for transitional dilator which allowed passage of the
Benson wire into the IVC. Over this, the transitional dilator was
exchanged for a 5 French MPA catheter. A small incision was made on
the right anterior chest wall and a subcutaneous pocket fashioned.
The power-injectable port was positioned and its catheter tunneled
to the right IJ dermatotomy site. The MPA catheter was exchanged
over an Amplatz wire for a peel-away sheath, through which the port
catheter, which had been trimmed to the appropriate length, was
advanced and positioned under fluoroscopy with its tip at the
cavoatrial junction. Spot chest radiograph confirms good catheter
position and no pneumothorax. The pocket was closed with deep
interrupted and subcuticular continuous 3-0 Monocryl sutures. The
port was flushed per protocol. The incisions were covered with
Dermabond then covered with a sterile dressing.

COMPLICATIONS:
COMPLICATIONS
None immediate

[Series 300: line placements · 1 of 1 slices shown]
[im 1/1]
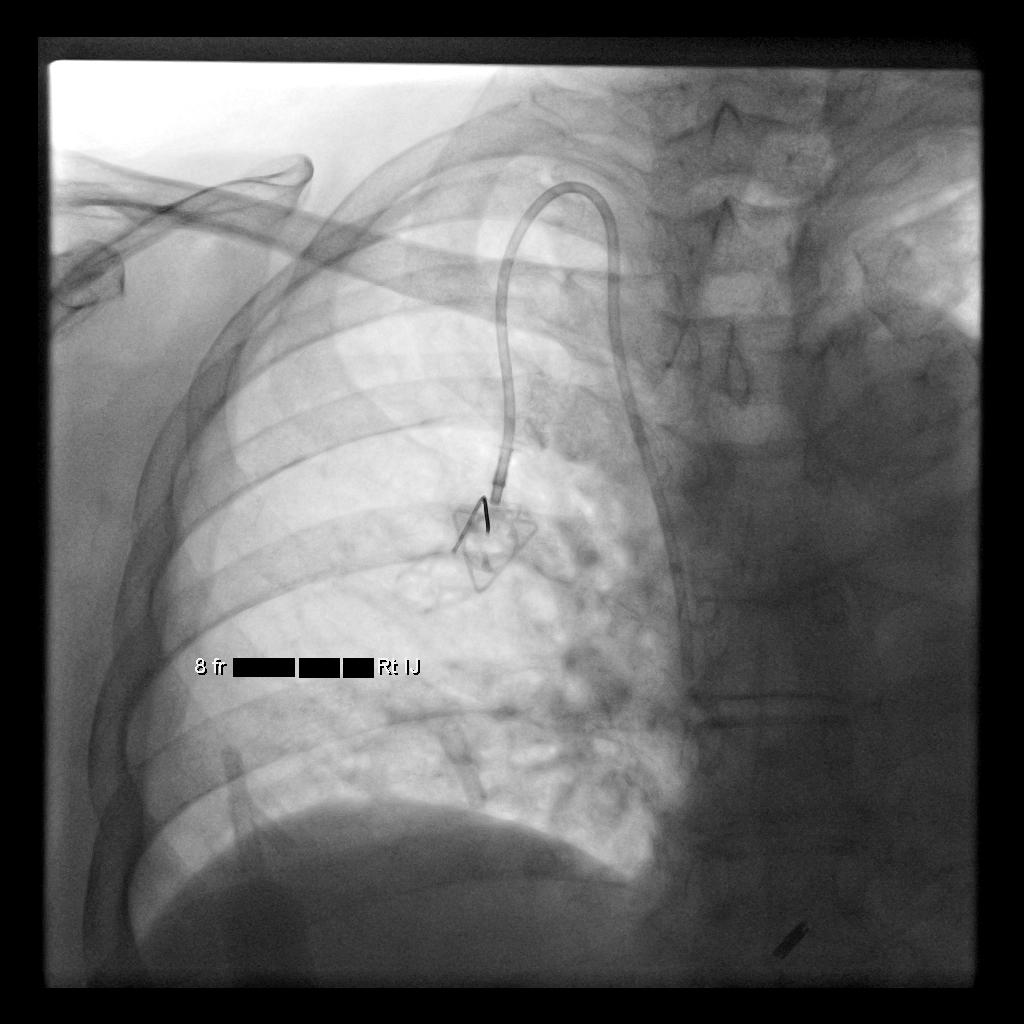

[1 of 1 positions shown; findings below may reference images not displayed]

EXAM:
TUNNELED PORT CATHETER PLACEMENT WITH ULTRASOUND AND FLUOROSCOPIC
GUIDANCE

FLUOROSCOPY TIME:  0.1 minute; 25 u4ymB DAP

ANESTHESIA/SEDATION:
Intravenous Fentanyl and Versed were administered as conscious
sedation during continuous monitoring of the patient's level of
consciousness and physiological / cardiorespiratory status by the
radiology RN, with a total moderate sedation time of 12 minutes.
IMPRESSION: Technically successful right IJ power-injectable port catheter
placement. Ready for routine use.

## 2019-07-19 DIAGNOSIS — H31009 Unspecified chorioretinal scars, unspecified eye: Secondary | ICD-10-CM | POA: Diagnosis not present

## 2019-07-19 DIAGNOSIS — H348312 Tributary (branch) retinal vein occlusion, right eye, stable: Secondary | ICD-10-CM | POA: Diagnosis not present

## 2019-07-19 DIAGNOSIS — H353131 Nonexudative age-related macular degeneration, bilateral, early dry stage: Secondary | ICD-10-CM | POA: Diagnosis not present

## 2019-07-19 DIAGNOSIS — H33322 Round hole, left eye: Secondary | ICD-10-CM | POA: Diagnosis not present

## 2019-07-20 ENCOUNTER — Ambulatory Visit: Payer: Medicare HMO

## 2019-07-27 ENCOUNTER — Telehealth: Payer: Self-pay | Admitting: Hematology

## 2019-07-27 NOTE — Telephone Encounter (Signed)
Returned patient's phone call regarding rescheduling 02/05 appointment, per patient's request appointment has moved to 02/12.

## 2019-07-29 ENCOUNTER — Ambulatory Visit: Payer: Medicare HMO | Attending: Internal Medicine

## 2019-07-29 DIAGNOSIS — Z23 Encounter for immunization: Secondary | ICD-10-CM | POA: Insufficient documentation

## 2019-07-29 NOTE — Progress Notes (Signed)
   Covid-19 Vaccination Clinic  Name:  Jennifer Juarez    MRN: [id number] DOB: 08/08/1945  07/29/2019  [name] was observed post Covid-19 immunization for 15 minutes without incidence. She was provided with Vaccine Information Sheet and instruction to access the V-Safe system.   [name] was instructed to call 911 with any severe reactions post vaccine: Marland Kitchen Difficulty breathing  . Swelling of your face and throat  . A fast heartbeat  . A bad rash all over your body  . Dizziness and weakness    Immunizations Administered    Name Date Dose VIS Date Route   Pfizer COVID-19 Vaccine 07/29/2019  4:15 PM 0.3 mL 06/04/2019 Intramuscular   Manufacturer: [place]   Lot: [id number]   [place]: [id number]

## 2019-07-30 ENCOUNTER — Other Ambulatory Visit: Payer: Medicare HMO

## 2019-07-30 ENCOUNTER — Ambulatory Visit: Payer: Medicare HMO | Admitting: Hematology

## 2019-08-06 ENCOUNTER — Inpatient Hospital Stay: Payer: Medicare HMO | Admitting: Hematology

## 2019-08-06 ENCOUNTER — Inpatient Hospital Stay: Payer: Medicare HMO | Attending: Hematology

## 2019-08-06 ENCOUNTER — Other Ambulatory Visit: Payer: Self-pay

## 2019-08-06 VITALS — BP 135/72 | HR 81 | Temp 97.8°F | Resp 18 | Ht 68.0 in | Wt 210.2 lb

## 2019-08-06 DIAGNOSIS — M8580 Other specified disorders of bone density and structure, unspecified site: Secondary | ICD-10-CM | POA: Insufficient documentation

## 2019-08-06 DIAGNOSIS — C8120 Mixed cellularity classical Hodgkin lymphoma, unspecified site: Secondary | ICD-10-CM | POA: Diagnosis present

## 2019-08-06 DIAGNOSIS — C8121 Mixed cellularity classical Hodgkin lymphoma, lymph nodes of head, face, and neck: Secondary | ICD-10-CM

## 2019-08-06 DIAGNOSIS — C8191 Hodgkin lymphoma, unspecified, lymph nodes of head, face, and neck: Secondary | ICD-10-CM | POA: Diagnosis not present

## 2019-08-06 DIAGNOSIS — Z87891 Personal history of nicotine dependence: Secondary | ICD-10-CM | POA: Insufficient documentation

## 2019-08-06 LAB — CBC WITH DIFFERENTIAL/PLATELET
Abs Immature Granulocytes: 0.01 10*3/uL (ref 0.00–0.07)
Basophils Absolute: 0.1 10*3/uL (ref 0.0–0.1)
Basophils Relative: 1 %
Eosinophils Absolute: 0.2 10*3/uL (ref 0.0–0.5)
Eosinophils Relative: 3 %
HCT: 39.4 % (ref 36.0–46.0)
Hemoglobin: 12.6 g/dL (ref 12.0–15.0)
Immature Granulocytes: 0 %
Lymphocytes Relative: 28 %
Lymphs Abs: 2 10*3/uL (ref 0.7–4.0)
MCH: 28.1 pg (ref 26.0–34.0)
MCHC: 32 g/dL (ref 30.0–36.0)
MCV: 87.9 fL (ref 80.0–100.0)
Monocytes Absolute: 0.7 10*3/uL (ref 0.1–1.0)
Monocytes Relative: 10 %
Neutro Abs: 4.2 10*3/uL (ref 1.7–7.7)
Neutrophils Relative %: 58 %
Platelets: 316 10*3/uL (ref 150–400)
RBC: 4.48 MIL/uL (ref 3.87–5.11)
RDW: 13.7 % (ref 11.5–15.5)
WBC: 7.1 10*3/uL (ref 4.0–10.5)
nRBC: 0 % (ref 0.0–0.2)

## 2019-08-06 LAB — CMP (CANCER CENTER ONLY)
ALT: 12 U/L (ref 0–44)
AST: 19 U/L (ref 15–41)
Albumin: 3.9 g/dL (ref 3.5–5.0)
Alkaline Phosphatase: 95 U/L (ref 38–126)
Anion gap: 6 (ref 5–15)
BUN: 10 mg/dL (ref 8–23)
CO2: 27 mmol/L (ref 22–32)
Calcium: 9.2 mg/dL (ref 8.9–10.3)
Chloride: 107 mmol/L (ref 98–111)
Creatinine: 0.85 mg/dL (ref 0.44–1.00)
GFR, Est AFR Am: >60 mL/min (ref >60)
GFR, Estimated: >60 mL/min (ref >60)
Glucose, Bld: 99 mg/dL (ref 70–99)
Potassium: 4.1 mmol/L (ref 3.5–5.1)
Sodium: 140 mmol/L (ref 135–145)
Total Bilirubin: 0.3 mg/dL (ref 0.3–1.2)
Total Protein: 7.3 g/dL (ref 6.5–8.1)

## 2019-08-06 LAB — SEDIMENTATION RATE: Sed Rate: 20 mm/hr (ref 0–22)

## 2019-08-10 ENCOUNTER — Ambulatory Visit: Payer: Medicare HMO

## 2019-08-10 ENCOUNTER — Telehealth: Payer: Self-pay | Admitting: Hematology

## 2019-08-10 NOTE — Telephone Encounter (Signed)
Scheduled per 02/12 los, called patient and left a voicemail.

## 2019-08-12 NOTE — Progress Notes (Signed)
HEMATOLOGY/ONCOLOGY CLINIC NOTE  Date of Service: .08/06/2019  Patient Care Team: Kathyrn Lass, MD as PCP - General (Family Medicine)  CHIEF COMPLAINTS/PURPOSE OF CONSULTATION:  F/u for continued mx of  Hodgkin's Lymphoma   HISTORY OF PRESENTING ILLNESS:   Jennifer Juarez is a wonderful 74 y.o. female who has been referred to [place] by Dr. Lattie Haw Miller/Courtney Rolland Porter PA_C  for evaluation and management of likely newly diagnosed Hodgkin's Lymphoma.   The pt reports that she is doing very well overall and notes that she is fully functional and does not have any significant chronic medical problems or any functional limitations.  She notes that on 06/26/17 she noticed a knot on the left side of her neck that subsequently precipitated her CT scan and Bx prior to seeing [place] today.   On 07/28/17 the pt had a CT Soft Tissue Neck revealing Enlarged lymph nodes in the left neck compatible with neoplasm.Enlarged lymph nodes in the left neck. Large left level 2 lymph node 23 x 30 mm. Multiple posterior lymph nodes are present measuring up to 10 mm. No enlarged lymph nodes in the right neck. Biopsy recommended. Attention left tonsil on direct mucosal inspection. Possible tonsillar carcinoma on the left. Lymphoma also in the differential.    Of note prior to the patient's visit, pt has had a Lymph node Needle/Core biopsy completed on 08/12/17 with results revealing ATYPICAL LYMPHOID PROLIFERATION. Immunohistochemical stains were performed including LCA, CD20, PAX-5, CD3, CD15, and CD30 with appropriate controls. The large atypical lymphoid-appearing cells are positive for CD30, CD15 and PAX-5 and negative for CD3, CD20 and LCA. The lymphocytic population in the background show a mixture of T and B-cells with predominance of T-cells. The overall findings are limited but atypical and worrisome for a lymphoproliferative process, particularly. classical Hodgkin lymphoma. Excisional biopsy is strongly  recommended.  Also on 08/12/17 the pt had a Tissue Flow Cytometry revealing NO MONOCLONAL B-CELL POPULATION OR ABNORMAL T-CELL PHENOTYPE IDENTIFIED.   Most recent CBC with Diff. results (07/15/17) revealed all values WNL.   She notes that she has had a viral infection in 2005 that resulted in a complete loss of hearing in her right ear. She notes that her left ear has had fluid build up and has resulted in significant loss of hearing, for which she uses hearing aids.  On review of systems, pt reports good energy levels, and denies fevers, chills, night sweats, unexpected weight loss, skin itching, rashes, soreness, sore throat, difficulties swallowing, back pains, abdominal pains, leg swelling, and any other symptoms.   On PMHx the pt reports arthritis, vertigo, polyps. She denies heart, lung, kidney or liver problems. She denies DM.  On Social Hx the pt notes having quit smoking when she was 20. She reports infrequent EOH consumption. She denies chemical or radiation exposure.  On Family Hx she reports that her mother had liver cancer. Her sister had breast cancer at 21 y/o and pancreatic cancer at 76 y/o.  Interval History:   [name] is here for a scheduled follow-up of her classical Hodgkin's Lymphoma. The patient's last visit with [place] was on 03/29/2019. The pt reports that she is doing well overall.  The pt reports that she has has no acute new concerns. No new lumps or bumps.  Lab results today (03/29/19) of CBC w/diff and CMP is as follows: all values are WNL.   On review of systems, pt denies fevers, chills, night sweats, leg swelling, abdominal pain, new lumps/bumps and any  other symptoms.   MEDICAL HISTORY:  Past Medical History:  Diagnosis Date  . BPPV (benign paroxysmal positional vertigo)   . Cancer ([place])   . H/O seasonal allergies   . Hearing loss in right ear   . Left cervical lymphadenopathy   . Macular degeneration   . Obesity   . Osteopenia   . Pre-syncope 11/09/2017  .  Retinal vein occlusion   . Vitamin D deficiency     SURGICAL HISTORY: Past Surgical History:  Procedure Laterality Date  . CATARACT EXTRACTION, BILATERAL    . CHOLECYSTECTOMY    . IR FLUORO GUIDE PORT INSERTION RIGHT  10/10/2017  . IR REMOVAL TUN ACCESS W/ PORT W/O FL MOD SED  02/05/2018  . IR US GUIDE VASC ACCESS RIGHT  10/10/2017  . MASS EXCISION Left 09/22/2017   Procedure: LEFT CERVICAL LYMPH NODE EXCISION;  Surgeon: Izora Gala, MD;  Location: [facility];  Service: ENT;  Laterality: Left;  . TUBAL LIGATION      SOCIAL HISTORY: Social History   Socioeconomic History  . Marital status: Single    Spouse name: Not on file  . Number of children: Not on file  . Years of education: Not on file  . Highest education level: Not on file  Occupational History  . Not on file  Tobacco Use  . Smoking status: Former Smoker    Quit date: 06/24/1988    Years since quitting: 31.1  . Smokeless tobacco: Never Used  Substance and Sexual Activity  . Alcohol use: No  . Drug use: No  . Sexual activity: Not on file  Other Topics Concern  . Not on file  Social History Narrative  . Not on file   Social Determinants of Health   Financial Resource Strain:   . Difficulty of Paying Living Expenses: Not on file  Food Insecurity:   . Worried About Charity fundraiser in the Last Year: Not on file  . Ran Out of Food in the Last Year: Not on file  Transportation Needs:   . Lack of Transportation (Medical): Not on file  . Lack of Transportation (Non-Medical): Not on file  Physical Activity:   . Days of Exercise per Week: Not on file  . Minutes of Exercise per Session: Not on file  Stress:   . Feeling of Stress : Not on file  Social Connections:   . Frequency of Communication with Friends and Family: Not on file  . Frequency of Social Gatherings with Friends and Family: Not on file  . Attends Religious Services: Not on file  . Active Member of Clubs or Organizations: Not on file   . Attends Archivist Meetings: Not on file  . Marital Status: Not on file  Intimate Partner Violence:   . Fear of Current or Ex-Partner: Not on file  . Emotionally Abused: Not on file  . Physically Abused: Not on file  . Sexually Abused: Not on file    FAMILY HISTORY: Family History  Problem Relation Age of Onset  . Cancer Mother        liver  . Heart attack Father   . Hypertension Father   . Diabetes Mellitus I Father   . Cancer Sister        pancreatic, breast  . Diabetes Mellitus I Sister   . Heart disease Brother   . Heart disease Brother   . Heart disease Brother   . Diabetes Mellitus I Sister   . Diabetes  Mellitus I Sister     ALLERGIES:  is allergic to cephalexin; doxycycline; levofloxacin; prilosec [omeprazole]; and singulair [montelukast].  MEDICATIONS:  Current Outpatient Medications  Medication Sig Dispense Refill  . acetaminophen (TYLENOL) 500 MG tablet Take 500 mg by mouth every 8 (eight) hours as needed for mild pain or moderate pain.     Marland Kitchen azithromycin (ZITHROMAX Z-PAK) 250 MG tablet Take 2 tablets by mouth on day one, then take 1 tablet daily for 4 days. 6 each 0  . dexamethasone (DECADRON) 4 MG tablet Take 2 tablets by mouth once a day on the day after chemotherapy and then take 2 tablets two times a day for 2 days. Take with food. (Patient not taking: Reported on 04/22/2018) 30 tablet 1  . lidocaine-prilocaine (EMLA) cream Apply to affected area once (Patient taking differently: Apply 1 application topically daily as needed. Port) 30 g 3  . loratadine (CLARITIN) 10 MG tablet Take 10 mg by mouth daily.    Marland Kitchen LORazepam (ATIVAN) 0.5 MG tablet Take 1 tablet (0.5 mg total) by mouth every 8 (eight) hours as needed for anxiety (nausea). 30 tablet 0  . ondansetron (ZOFRAN) 8 MG tablet Take 1 tablet (8 mg total) by mouth 2 (two) times daily as needed. Start on the third day after chemotherapy. 30 tablet 1  . polyethylene glycol (MIRALAX / GLYCOLAX) packet  Take 17 g by mouth daily.    . prochlorperazine (COMPAZINE) 10 MG tablet Take 1 tablet (10 mg total) by mouth every 6 (six) hours as needed (Nausea or vomiting). (Patient not taking: Reported on 04/22/2018) 30 tablet 1  . promethazine (PHENERGAN) 25 MG suppository Place 1 suppository (25 mg total) rectally every 6 (six) hours as needed for nausea or vomiting. 12 suppository 1  . triamcinolone (NASACORT AQ) 55 MCG/ACT AERO nasal inhaler Place 2 sprays into the nose daily as needed (allergies).      No current facility-administered medications for this visit.    REVIEW OF SYSTEMS:   .10 Point review of Systems was done is negative except as noted above.  PHYSICAL EXAMINATION: ECOG PERFORMANCE STATUS: 1 BP 135/72 (BP Location: Left Arm, Patient Position: Sitting)   Pulse 81   Temp 97.8 F (36.6 C) (Temporal)   Resp 18   Ht 5\' 8"  (1.727 m)   Wt 210 lb 3.2 oz (95.3 kg)   SpO2 97%   BMI 31.96 kg/m  . GENERAL:alert, in no acute distress and comfortable SKIN: no acute rashes, no significant lesions EYES: conjunctiva are pink and non-injected, sclera anicteric OROPHARYNX: MMM, no exudates, no oropharyngeal erythema or ulceration NECK: supple, no JVD LYMPH:  no palpable lymphadenopathy in the cervical, axillary or inguinal regions LUNGS: clear to auscultation b/l with normal respiratory effort HEART: regular rate & rhythm ABDOMEN:  normoactive bowel sounds , non tender, not distended. Extremity: no pedal edema PSYCH: alert & oriented x 3 with fluent speech NEURO: no focal motor/sensory deficits  LABORATORY DATA:  I have reviewed the data as listed  . CBC Latest Ref Rng & Units 08/06/2019 03/29/2019 11/25/2018  WBC 4.0 - 10.5 K/uL 7.1 6.8 7.5  Hemoglobin 12.0 - 15.0 g/dL 12.6 12.3 12.4  Hematocrit 36.0 - 46.0 % 39.4 38.3 39.2  Platelets 150 - 400 K/uL 316 311 299   . CMP Latest Ref Rng & Units 08/06/2019 03/29/2019 11/25/2018  Glucose 70 - 99 mg/dL 99 94 89  BUN 8 - 23 mg/dL 10 8 10    Creatinine 0.44 - 1.00 mg/dL 0.85  0.80 0.85  Sodium 135 - 145 mmol/L 140 140 139  Potassium 3.5 - 5.1 mmol/L 4.1 4.1 4.1  Chloride 98 - 111 mmol/L 107 107 106  CO2 22 - 32 mmol/L 27 25 24   Calcium 8.9 - 10.3 mg/dL 9.2 9.4 9.3  Total Protein 6.5 - 8.1 g/dL 7.3 7.3 7.4  Total Bilirubin 0.3 - 1.2 mg/dL 0.3 0.4 0.3  Alkaline Phos 38 - 126 U/L 95 92 87  AST 15 - 41 U/L 19 24 19   ALT 0 - 44 U/L 12 14 9        08/12/17 Lymph Node Needle/core Biopsy:   08/12/17 Tissue Flow Cytometry:    09/22/17 Tissue Flow Cytometry    09/22/17 Lymph Node Pathology:   RADIOGRAPHIC STUDIES: I have personally reviewed the radiological images as listed and agreed with the findings in the report. No results found.  ASSESSMENT & PLAN:   74 y.o. female with  1. Stage IA Hodgkin's Lymphoma (mixed cellularity) No constitutional symptoms Sed rate elevated to 30 PET findings most consistent with Stage I disease; discussed that a small hypermetabolic nodule found in her liver has remain unchanged for 4 years and is not considered to be involved with her Hodgkin's lymphoma.  -Left cervical lymph nodes are involved without further involvement.  01/15/18 PET/CT  revealed Interval complete response of left cervical hypermetabolic lymphadenopathy, now [place] 2. Stable 1.9 cm hypermetabolic retroperitoneal soft tissue nodule in the posterior right upper quadrant. Indeterminate. Tiny left groin lymph nodes are unchanged in size since prior PET-CT and 1 of these demonstrates a clear central fatty hilum. Both are FDG avid on today's exam (not seen previously). Given response of cervical disease, these features may reflect reactive etiology and attention on follow-up suggested. Stable fusiform descending thoracic aortic aneurysm. Follow-up CT in 1 year could be used to reassess.   PLAN: -Discussed pt labwork today, unremarkable10/05/ all values are WNL.  -Discussedsed rate wnl @ 20 -The pt shows no clinical or lab  progression/return of her Hodgkin's lymphoma at this time.  -No indication for further treatment at this time.  -Will see back in 4 months   FOLLOW UP: RTC with Dr Irene Limbo with labs in 4 months  The total time spent in the appt was 15 minutes and more than 50% was on counseling and direct patient cares.  All of the patient's questions were answered with apparent satisfaction. The patient knows to call the clinic with any problems, questions or concerns.    Sullivan Lone MD [facility] AAHIVMS [facility] Hematology/Oncology Physician [facility]  (Office):       [phone] (Work cell):  [phone] (Fax):           [phone]  08/12/2019 11:32 PM  I, Yevette Edwards, am acting as a scribe for Dr. Sullivan Lone.   .I have reviewed the above documentation for accuracy and completeness, and I agree with the above. Brunetta Genera MD

## 2019-08-24 ENCOUNTER — Ambulatory Visit: Payer: Medicare HMO | Attending: Internal Medicine

## 2019-08-24 DIAGNOSIS — Z23 Encounter for immunization: Secondary | ICD-10-CM | POA: Insufficient documentation

## 2019-08-24 NOTE — Progress Notes (Signed)
   Covid-19 Vaccination Clinic  Name:  Jennifer Juarez    MRN: [id number] DOB: 05-26-1946  08/24/2019  [name] was observed post Covid-19 immunization for 15 minutes without incident. She was provided with Vaccine Information Sheet and instruction to access the V-Safe system.   [name] was instructed to call 911 with any severe reactions post vaccine: Marland Kitchen Difficulty breathing  . Swelling of face and throat  . A fast heartbeat  . A bad rash all over body  . Dizziness and weakness   Immunizations Administered    Name Date Dose VIS Date Route   Pfizer COVID-19 Vaccine 08/24/2019 12:35 PM 0.3 mL 06/04/2019 Intramuscular   Manufacturer: [place]   Lot: [id number]   Sophia: [id number]

## 2019-09-07 DIAGNOSIS — C44612 Basal cell carcinoma of skin of right upper limb, including shoulder: Secondary | ICD-10-CM | POA: Diagnosis not present

## 2019-09-07 DIAGNOSIS — D485 Neoplasm of uncertain behavior of skin: Secondary | ICD-10-CM | POA: Diagnosis not present

## 2019-09-07 DIAGNOSIS — L2089 Other atopic dermatitis: Secondary | ICD-10-CM | POA: Diagnosis not present

## 2019-09-07 DIAGNOSIS — L570 Actinic keratosis: Secondary | ICD-10-CM | POA: Diagnosis not present

## 2019-10-04 DIAGNOSIS — C44612 Basal cell carcinoma of skin of right upper limb, including shoulder: Secondary | ICD-10-CM | POA: Diagnosis not present

## 2019-11-10 DIAGNOSIS — Z131 Encounter for screening for diabetes mellitus: Secondary | ICD-10-CM | POA: Diagnosis not present

## 2019-11-10 DIAGNOSIS — Z8579 Personal history of other malignant neoplasms of lymphoid, hematopoietic and related tissues: Secondary | ICD-10-CM | POA: Diagnosis not present

## 2019-11-10 DIAGNOSIS — K635 Polyp of colon: Secondary | ICD-10-CM | POA: Diagnosis not present

## 2019-11-10 DIAGNOSIS — E538 Deficiency of other specified B group vitamins: Secondary | ICD-10-CM | POA: Diagnosis not present

## 2019-11-10 DIAGNOSIS — K2100 Gastro-esophageal reflux disease with esophagitis, without bleeding: Secondary | ICD-10-CM | POA: Diagnosis not present

## 2019-11-10 DIAGNOSIS — Z136 Encounter for screening for cardiovascular disorders: Secondary | ICD-10-CM | POA: Diagnosis not present

## 2019-11-10 DIAGNOSIS — K5900 Constipation, unspecified: Secondary | ICD-10-CM | POA: Diagnosis not present

## 2019-11-10 DIAGNOSIS — E559 Vitamin D deficiency, unspecified: Secondary | ICD-10-CM | POA: Diagnosis not present

## 2019-11-10 DIAGNOSIS — Z0000 Encounter for general adult medical examination without abnormal findings: Secondary | ICD-10-CM | POA: Diagnosis not present

## 2019-11-10 DIAGNOSIS — Z1159 Encounter for screening for other viral diseases: Secondary | ICD-10-CM | POA: Diagnosis not present

## 2019-11-10 DIAGNOSIS — J309 Allergic rhinitis, unspecified: Secondary | ICD-10-CM | POA: Diagnosis not present

## 2019-11-15 ENCOUNTER — Other Ambulatory Visit: Payer: Self-pay | Admitting: Family Medicine

## 2019-11-15 DIAGNOSIS — E2839 Other primary ovarian failure: Secondary | ICD-10-CM

## 2019-12-06 ENCOUNTER — Other Ambulatory Visit: Payer: Self-pay

## 2019-12-06 ENCOUNTER — Inpatient Hospital Stay: Payer: Medicare HMO | Admitting: Hematology

## 2019-12-06 ENCOUNTER — Inpatient Hospital Stay: Payer: Medicare HMO | Attending: Hematology

## 2019-12-06 VITALS — BP 147/78 | HR 70 | Temp 97.5°F | Resp 18 | Ht 68.0 in | Wt 209.7 lb

## 2019-12-06 DIAGNOSIS — Z87891 Personal history of nicotine dependence: Secondary | ICD-10-CM | POA: Insufficient documentation

## 2019-12-06 DIAGNOSIS — C8191 Hodgkin lymphoma, unspecified, lymph nodes of head, face, and neck: Secondary | ICD-10-CM | POA: Diagnosis not present

## 2019-12-06 DIAGNOSIS — Z9221 Personal history of antineoplastic chemotherapy: Secondary | ICD-10-CM | POA: Diagnosis not present

## 2019-12-06 DIAGNOSIS — Z8571 Personal history of Hodgkin lymphoma: Secondary | ICD-10-CM | POA: Diagnosis not present

## 2019-12-06 LAB — CMP (CANCER CENTER ONLY)
ALT: 12 U/L (ref 0–44)
AST: 20 U/L (ref 15–41)
Albumin: 3.8 g/dL (ref 3.5–5.0)
Alkaline Phosphatase: 81 U/L (ref 38–126)
Anion gap: 9 (ref 5–15)
BUN: 10 mg/dL (ref 8–23)
CO2: 26 mmol/L (ref 22–32)
Calcium: 9.5 mg/dL (ref 8.9–10.3)
Chloride: 106 mmol/L (ref 98–111)
Creatinine: 0.83 mg/dL (ref 0.44–1.00)
GFR, Est AFR Am: >60 mL/min (ref >60)
GFR, Estimated: >60 mL/min (ref >60)
Glucose, Bld: 100 mg/dL — ABNORMAL HIGH (ref 70–99)
Potassium: 4.2 mmol/L (ref 3.5–5.1)
Sodium: 141 mmol/L (ref 135–145)
Total Bilirubin: 0.4 mg/dL (ref 0.3–1.2)
Total Protein: 7.1 g/dL (ref 6.5–8.1)

## 2019-12-06 LAB — CBC WITH DIFFERENTIAL/PLATELET
Abs Immature Granulocytes: 0.02 10*3/uL (ref 0.00–0.07)
Basophils Absolute: 0.1 10*3/uL (ref 0.0–0.1)
Basophils Relative: 1 %
Eosinophils Absolute: 0.2 10*3/uL (ref 0.0–0.5)
Eosinophils Relative: 3 %
HCT: 38.6 % (ref 36.0–46.0)
Hemoglobin: 12.4 g/dL (ref 12.0–15.0)
Immature Granulocytes: 0 %
Lymphocytes Relative: 31 %
Lymphs Abs: 2 10*3/uL (ref 0.7–4.0)
MCH: 28.2 pg (ref 26.0–34.0)
MCHC: 32.1 g/dL (ref 30.0–36.0)
MCV: 87.7 fL (ref 80.0–100.0)
Monocytes Absolute: 0.6 10*3/uL (ref 0.1–1.0)
Monocytes Relative: 9 %
Neutro Abs: 3.6 10*3/uL (ref 1.7–7.7)
Neutrophils Relative %: 56 %
Platelets: 301 10*3/uL (ref 150–400)
RBC: 4.4 MIL/uL (ref 3.87–5.11)
RDW: 13.7 % (ref 11.5–15.5)
WBC: 6.5 10*3/uL (ref 4.0–10.5)
nRBC: 0 % (ref 0.0–0.2)

## 2019-12-06 LAB — SEDIMENTATION RATE: Sed Rate: 23 mm/hr — ABNORMAL HIGH (ref 0–22)

## 2019-12-06 NOTE — Progress Notes (Signed)
HEMATOLOGY/ONCOLOGY CLINIC NOTE  Date of Service: .08/06/2019  Patient Care Team: Kathyrn Lass, MD as PCP - General (Family Medicine)  CHIEF COMPLAINTS/PURPOSE OF CONSULTATION:  F/u for continued mx of  Hodgkin's Lymphoma   HISTORY OF PRESENTING ILLNESS:   Jennifer Juarez is a wonderful 74 y.o. female who has been referred to [place] by Dr. Lattie Haw Miller/Courtney Rolland Porter PA_C  for evaluation and management of likely newly diagnosed Hodgkin's Lymphoma.   The pt reports that she is doing very well overall and notes that she is fully functional and does not have any significant chronic medical problems or any functional limitations.  She notes that on 06/26/17 she noticed a knot on the left side of her neck that subsequently precipitated her CT scan and Bx prior to seeing [place] today.   On 07/28/17 the pt had a CT Soft Tissue Neck revealing Enlarged lymph nodes in the left neck compatible with neoplasm.Enlarged lymph nodes in the left neck. Large left level 2 lymph node 23 x 30 mm. Multiple posterior lymph nodes are present measuring up to 10 mm. No enlarged lymph nodes in the right neck. Biopsy recommended. Attention left tonsil on direct mucosal inspection. Possible tonsillar carcinoma on the left. Lymphoma also in the differential.    Of note prior to the patient's visit, pt has had a Lymph node Needle/Core biopsy completed on 08/12/17 with results revealing ATYPICAL LYMPHOID PROLIFERATION. Immunohistochemical stains were performed including LCA, CD20, PAX-5, CD3, CD15, and CD30 with appropriate controls. The large atypical lymphoid-appearing cells are positive for CD30, CD15 and PAX-5 and negative for CD3, CD20 and LCA. The lymphocytic population in the background show a mixture of T and B-cells with predominance of T-cells. The overall findings are limited but atypical and worrisome for a lymphoproliferative process, particularly. classical Hodgkin lymphoma. Excisional biopsy is strongly  recommended.  Also on 08/12/17 the pt had a Tissue Flow Cytometry revealing NO MONOCLONAL B-CELL POPULATION OR ABNORMAL T-CELL PHENOTYPE IDENTIFIED.   Most recent CBC with Diff. results (07/15/17) revealed all values WNL.   She notes that she has had a viral infection in 2005 that resulted in a complete loss of hearing in her right ear. She notes that her left ear has had fluid build up and has resulted in significant loss of hearing, for which she uses hearing aids.  On review of systems, pt reports good energy levels, and denies fevers, chills, night sweats, unexpected weight loss, skin itching, rashes, soreness, sore throat, difficulties swallowing, back pains, abdominal pains, leg swelling, and any other symptoms.   On PMHx the pt reports arthritis, vertigo, polyps. She denies heart, lung, kidney or liver problems. She denies DM.  On Social Hx the pt notes having quit smoking when she was 53. She reports infrequent EOH consumption. She denies chemical or radiation exposure.  On Family Hx she reports that her mother had liver cancer. Her sister had breast cancer at 54 y/o and pancreatic cancer at 76 y/o.  Interval History:  Jennifer Juarez is here for a scheduled follow-up of her classical Hodgkin's Lymphoma. The patient's last visit with [place] was on 08/06/2019. The pt reports that she is doing well overall.  The pt reports that she has received both doses of the COVID19 vaccine and had minimal issues. Pt was placed on 5 mg Lipitor twice a week for an elevated LDL. She had a Basal Cell Carcinoma lesion removed from her right forearm in the interim and has a follow-up scheduled with her Dermatologist.  Lab results today (12/06/19) of CBC w/diff and CMP is as follows: all values are WNL except for Glucose at 100. 12/06/2019 Sed Rate at 23  On review of systems, pt denies fevers, chills, night sweats, new lumps/bumps, weight loss, low appetite, rashes and any other symptoms.   MEDICAL HISTORY:  Past  Medical History:  Diagnosis Date  . BPPV (benign paroxysmal positional vertigo)   . Cancer ([place])   . H/O seasonal allergies   . Hearing loss in right ear   . Left cervical lymphadenopathy   . Macular degeneration   . Obesity   . Osteopenia   . Pre-syncope 11/09/2017  . Retinal vein occlusion   . Vitamin D deficiency     SURGICAL HISTORY: Past Surgical History:  Procedure Laterality Date  . CATARACT EXTRACTION, BILATERAL    . CHOLECYSTECTOMY    . IR FLUORO GUIDE PORT INSERTION RIGHT  10/10/2017  . IR REMOVAL TUN ACCESS W/ PORT W/O FL MOD SED  02/05/2018  . IR US GUIDE VASC ACCESS RIGHT  10/10/2017  . MASS EXCISION Left 09/22/2017   Procedure: LEFT CERVICAL LYMPH NODE EXCISION;  Surgeon: Izora Gala, MD;  Location: [facility];  Service: ENT;  Laterality: Left;  . TUBAL LIGATION      SOCIAL HISTORY: Social History   Socioeconomic History  . Marital status: Single    Spouse name: Not on file  . Number of children: Not on file  . Years of education: Not on file  . Highest education level: Not on file  Occupational History  . Not on file  Tobacco Use  . Smoking status: Former Smoker    Quit date: 06/24/1988    Years since quitting: 31.4  . Smokeless tobacco: Never Used  Vaping Use  . Vaping Use: Never used  Substance and Sexual Activity  . Alcohol use: No  . Drug use: No  . Sexual activity: Not on file  Other Topics Concern  . Not on file  Social History Narrative  . Not on file   Social Determinants of Health   Financial Resource Strain:   . Difficulty of Paying Living Expenses:   Food Insecurity:   . Worried About Charity fundraiser in the Last Year:   . Arboriculturist in the Last Year:   Transportation Needs:   . Film/video editor (Medical):   Marland Kitchen Lack of Transportation (Non-Medical):   Physical Activity:   . Days of Exercise per Week:   . Minutes of Exercise per Session:   Stress:   . Feeling of Stress :   Social Connections:   .  Frequency of Communication with Friends and Family:   . Frequency of Social Gatherings with Friends and Family:   . Attends Religious Services:   . Active Member of Clubs or Organizations:   . Attends Archivist Meetings:   Marland Kitchen Marital Status:   Intimate Partner Violence:   . Fear of Current or Ex-Partner:   . Emotionally Abused:   Marland Kitchen Physically Abused:   . Sexually Abused:     FAMILY HISTORY: Family History  Problem Relation Age of Onset  . Cancer Mother        liver  . Heart attack Father   . Hypertension Father   . Diabetes Mellitus I Father   . Cancer Sister        pancreatic, breast  . Diabetes Mellitus I Sister   . Heart disease Brother   . Heart  disease Brother   . Heart disease Brother   . Diabetes Mellitus I Sister   . Diabetes Mellitus I Sister     ALLERGIES:  is allergic to cephalexin, doxycycline, levofloxacin, prilosec [omeprazole], and singulair [montelukast].  MEDICATIONS:  Current Outpatient Medications  Medication Sig Dispense Refill  . acetaminophen (TYLENOL) 500 MG tablet Take 500 mg by mouth every 8 (eight) hours as needed for mild pain or moderate pain.     Marland Kitchen azithromycin (ZITHROMAX Z-PAK) 250 MG tablet Take 2 tablets by mouth on day one, then take 1 tablet daily for 4 days. 6 each 0  . dexamethasone (DECADRON) 4 MG tablet Take 2 tablets by mouth once a day on the day after chemotherapy and then take 2 tablets two times a day for 2 days. Take with food. (Patient not taking: Reported on 04/22/2018) 30 tablet 1  . lidocaine-prilocaine (EMLA) cream Apply to affected area once (Patient taking differently: Apply 1 application topically daily as needed. Port) 30 g 3  . loratadine (CLARITIN) 10 MG tablet Take 10 mg by mouth daily.    Marland Kitchen LORazepam (ATIVAN) 0.5 MG tablet Take 1 tablet (0.5 mg total) by mouth every 8 (eight) hours as needed for anxiety (nausea). 30 tablet 0  . ondansetron (ZOFRAN) 8 MG tablet Take 1 tablet (8 mg total) by mouth 2 (two) times  daily as needed. Start on the third day after chemotherapy. 30 tablet 1  . polyethylene glycol (MIRALAX / GLYCOLAX) packet Take 17 g by mouth daily.    . prochlorperazine (COMPAZINE) 10 MG tablet Take 1 tablet (10 mg total) by mouth every 6 (six) hours as needed (Nausea or vomiting). (Patient not taking: Reported on 04/22/2018) 30 tablet 1  . promethazine (PHENERGAN) 25 MG suppository Place 1 suppository (25 mg total) rectally every 6 (six) hours as needed for nausea or vomiting. 12 suppository 1  . triamcinolone (NASACORT AQ) 55 MCG/ACT AERO nasal inhaler Place 2 sprays into the nose daily as needed (allergies).      No current facility-administered medications for this visit.    REVIEW OF SYSTEMS:   A 10+ POINT REVIEW OF SYSTEMS WAS OBTAINED including neurology, dermatology, psychiatry, cardiac, respiratory, lymph, extremities, GI, GU, Musculoskeletal, constitutional, breasts, reproductive, HEENT.  All pertinent positives are noted in the HPI.  All others are negative.   PHYSICAL EXAMINATION: ECOG PERFORMANCE STATUS: 1 BP (!) 147/78 (BP Location: Left Arm, Patient Position: Sitting) Comment: RN notified  Pulse 70   Temp (!) 97.5 F (36.4 C) (Temporal)   Resp 18   Ht 5\' 8"  (1.727 m)   Wt 209 lb 11.2 oz (95.1 kg)   SpO2 98%   BMI 31.88 kg/m    GENERAL:alert, in no acute distress and comfortable SKIN: no acute rashes, no significant lesions EYES: conjunctiva are pink and non-injected, sclera anicteric OROPHARYNX: MMM, no exudates, no oropharyngeal erythema or ulceration NECK: supple, no JVD LYMPH:  no palpable lymphadenopathy in the cervical, axillary or inguinal regions LUNGS: clear to auscultation b/l with normal respiratory effort HEART: regular rate & rhythm ABDOMEN:  normoactive bowel sounds , non tender, not distended. No palpable hepatosplenomegaly.  Extremity: no pedal edema PSYCH: alert & oriented x 3 with fluent speech NEURO: no focal motor/sensory deficits  LABORATORY  DATA:  I have reviewed the data as listed  . CBC Latest Ref Rng & Units 12/06/2019 08/06/2019 03/29/2019  WBC 4.0 - 10.5 K/uL 6.5 7.1 6.8  Hemoglobin 12.0 - 15.0 g/dL 12.4 12.6 12.3  Hematocrit  36 - 46 % 38.6 39.4 38.3  Platelets 150 - 400 K/uL 301 316 311   . CMP Latest Ref Rng & Units 12/06/2019 08/06/2019 03/29/2019  Glucose 70 - 99 mg/dL 100(H) 99 94  BUN 8 - 23 mg/dL 10 10 8   Creatinine 0.44 - 1.00 mg/dL 0.83 0.85 0.80  Sodium 135 - 145 mmol/L 141 140 140  Potassium 3.5 - 5.1 mmol/L 4.2 4.1 4.1  Chloride 98 - 111 mmol/L 106 107 107  CO2 22 - 32 mmol/L 26 27 25   Calcium 8.9 - 10.3 mg/dL 9.5 9.2 9.4  Total Protein 6.5 - 8.1 g/dL 7.1 7.3 7.3  Total Bilirubin 0.3 - 1.2 mg/dL 0.4 0.3 0.4  Alkaline Phos 38 - 126 U/L 81 95 92  AST 15 - 41 U/L 20 19 24   ALT 0 - 44 U/L 12 12 14        08/12/17 Lymph Node Needle/core Biopsy:   08/12/17 Tissue Flow Cytometry:    09/22/17 Tissue Flow Cytometry    09/22/17 Lymph Node Pathology:   RADIOGRAPHIC STUDIES: I have personally reviewed the radiological images as listed and agreed with the findings in the report. No results found.  ASSESSMENT & PLAN:   74 y.o. female with  1. Stage IA Hodgkin's Lymphoma (mixed cellularity) No constitutional symptoms Sed rate elevated to 30 PET findings most consistent with Stage I disease; discussed that a small hypermetabolic nodule found in her liver has remain unchanged for 4 years and is not considered to be involved with her Hodgkin's lymphoma.  -Left cervical lymph nodes are involved without further involvement.  01/15/18 PET/CT  revealed Interval complete response of left cervical hypermetabolic lymphadenopathy, now [place] 2. Stable 1.9 cm hypermetabolic retroperitoneal soft tissue nodule in the posterior right upper quadrant. Indeterminate. Tiny left groin lymph nodes are unchanged in size since prior PET-CT and 1 of these demonstrates a clear central fatty hilum. Both are FDG avid on today's  exam (not seen previously). Given response of cervical disease, these features may reflect reactive etiology and attention on follow-up suggested. Stable fusiform descending thoracic aortic aneurysm. Follow-up CT in 1 year could be used to reassess.   PLAN: -Discussed pt labwork today, 12/06/19; blood counts and chemistries are nml, Sed Rate is stable at 23. -No lab or clinical evidence of Hodgkin's Lymphoma recurrence at this time. No indication for further treatment.  -Pt is nearly 2 years out from last treatment. Due to stability of disease will move f/u out to q22months.   -Recommend pt f/u with PCP for age-appropriate vaccinations and annual flu vaccine. Recommend pt receive Shingles vaccine.  -Recommend pt f/u with Dermatologist as scheduled for skin cancer monitoring -Will see back in 6 months with labs   FOLLOW UP: RTC with Dr. Irene Limbo with labs in 6 months    The total time spent in the appt was 20 minutes and more than 50% was on counseling and direct patient cares.  All of the patient's questions were answered with apparent satisfaction. The patient knows to call the clinic with any problems, questions or concerns.    Sullivan Lone MD [place] AAHIVMS [facility] Hematology/Oncology Physician [facility] [place]  (Office):       [phone] (Work cell):  [phone] (Fax):           [phone]  12/06/2019 11:57 AM  I, Yevette Edwards, am acting as a scribe for Dr. Sullivan Lone.   .I have reviewed the above documentation for accuracy and completeness, and I  agree with the above. Brunetta Genera MD

## 2020-01-03 DIAGNOSIS — Z85828 Personal history of other malignant neoplasm of skin: Secondary | ICD-10-CM | POA: Diagnosis not present

## 2020-01-03 DIAGNOSIS — L570 Actinic keratosis: Secondary | ICD-10-CM | POA: Diagnosis not present

## 2020-01-03 DIAGNOSIS — D1801 Hemangioma of skin and subcutaneous tissue: Secondary | ICD-10-CM | POA: Diagnosis not present

## 2020-01-03 DIAGNOSIS — D229 Melanocytic nevi, unspecified: Secondary | ICD-10-CM | POA: Diagnosis not present

## 2020-01-03 DIAGNOSIS — L814 Other melanin hyperpigmentation: Secondary | ICD-10-CM | POA: Diagnosis not present

## 2020-01-03 DIAGNOSIS — L821 Other seborrheic keratosis: Secondary | ICD-10-CM | POA: Diagnosis not present

## 2020-01-03 DIAGNOSIS — X32XXXS Exposure to sunlight, sequela: Secondary | ICD-10-CM | POA: Diagnosis not present

## 2020-02-03 ENCOUNTER — Other Ambulatory Visit: Payer: Self-pay

## 2020-02-03 ENCOUNTER — Ambulatory Visit
Admission: RE | Admit: 2020-02-03 | Discharge: 2020-02-03 | Disposition: A | Discharge status: Home / Self Care | Payer: Medicare HMO | Source: Ambulatory Visit | Attending: Family Medicine | Admitting: Family Medicine

## 2020-02-03 DIAGNOSIS — M85852 Other specified disorders of bone density and structure, left thigh: Secondary | ICD-10-CM | POA: Diagnosis not present

## 2020-02-03 DIAGNOSIS — Z780 Asymptomatic menopausal state: Secondary | ICD-10-CM | POA: Diagnosis not present

## 2020-02-03 DIAGNOSIS — E2839 Other primary ovarian failure: Secondary | ICD-10-CM

## 2020-02-11 DIAGNOSIS — I700 Atherosclerosis of aorta: Secondary | ICD-10-CM | POA: Diagnosis not present

## 2020-02-11 DIAGNOSIS — E559 Vitamin D deficiency, unspecified: Secondary | ICD-10-CM | POA: Diagnosis not present

## 2020-03-20 DIAGNOSIS — R69 Illness, unspecified: Secondary | ICD-10-CM | POA: Diagnosis not present

## 2020-05-02 ENCOUNTER — Other Ambulatory Visit: Payer: Self-pay | Admitting: Family Medicine

## 2020-05-02 DIAGNOSIS — Z1231 Encounter for screening mammogram for malignant neoplasm of breast: Secondary | ICD-10-CM

## 2020-05-03 ENCOUNTER — Ambulatory Visit
Admission: RE | Admit: 2020-05-03 | Discharge: 2020-05-03 | Disposition: A | Discharge status: Home / Self Care | Payer: Medicare HMO | Source: Ambulatory Visit | Attending: Family Medicine | Admitting: Family Medicine

## 2020-05-03 ENCOUNTER — Other Ambulatory Visit: Payer: Self-pay

## 2020-05-03 DIAGNOSIS — Z1231 Encounter for screening mammogram for malignant neoplasm of breast: Secondary | ICD-10-CM

## 2020-06-06 ENCOUNTER — Inpatient Hospital Stay: Payer: Medicare HMO | Attending: Hematology

## 2020-06-06 ENCOUNTER — Inpatient Hospital Stay: Payer: Medicare HMO | Admitting: Hematology

## 2020-06-06 ENCOUNTER — Other Ambulatory Visit: Payer: Self-pay

## 2020-06-06 VITALS — BP 147/84 | HR 78 | Temp 98.0°F | Resp 17 | Wt 207.8 lb

## 2020-06-06 DIAGNOSIS — C8191 Hodgkin lymphoma, unspecified, lymph nodes of head, face, and neck: Secondary | ICD-10-CM

## 2020-06-06 DIAGNOSIS — K7689 Other specified diseases of liver: Secondary | ICD-10-CM | POA: Insufficient documentation

## 2020-06-06 DIAGNOSIS — M8589 Other specified disorders of bone density and structure, multiple sites: Secondary | ICD-10-CM | POA: Insufficient documentation

## 2020-06-06 DIAGNOSIS — C8121 Mixed cellularity classical Hodgkin lymphoma, lymph nodes of head, face, and neck: Secondary | ICD-10-CM | POA: Insufficient documentation

## 2020-06-06 DIAGNOSIS — Z79899 Other long term (current) drug therapy: Secondary | ICD-10-CM | POA: Diagnosis not present

## 2020-06-06 LAB — CBC WITH DIFFERENTIAL/PLATELET
Abs Immature Granulocytes: 0.01 10*3/uL (ref 0.00–0.07)
Basophils Absolute: 0.1 10*3/uL (ref 0.0–0.1)
Basophils Relative: 1 %
Eosinophils Absolute: 0.3 10*3/uL (ref 0.0–0.5)
Eosinophils Relative: 4 %
HCT: 37.7 % (ref 36.0–46.0)
Hemoglobin: 12 g/dL (ref 12.0–15.0)
Immature Granulocytes: 0 %
Lymphocytes Relative: 31 %
Lymphs Abs: 1.9 10*3/uL (ref 0.7–4.0)
MCH: 27.6 pg (ref 26.0–34.0)
MCHC: 31.8 g/dL (ref 30.0–36.0)
MCV: 86.7 fL (ref 80.0–100.0)
Monocytes Absolute: 0.5 10*3/uL (ref 0.1–1.0)
Monocytes Relative: 8 %
Neutro Abs: 3.5 10*3/uL (ref 1.7–7.7)
Neutrophils Relative %: 56 %
Platelets: 296 10*3/uL (ref 150–400)
RBC: 4.35 MIL/uL (ref 3.87–5.11)
RDW: 13.8 % (ref 11.5–15.5)
WBC: 6.3 10*3/uL (ref 4.0–10.5)
nRBC: 0 % (ref 0.0–0.2)

## 2020-06-06 LAB — CMP (CANCER CENTER ONLY)
ALT: 13 U/L (ref 0–44)
AST: 22 U/L (ref 15–41)
Albumin: 3.8 g/dL (ref 3.5–5.0)
Alkaline Phosphatase: 80 U/L (ref 38–126)
Anion gap: 7 (ref 5–15)
BUN: 9 mg/dL (ref 8–23)
CO2: 25 mmol/L (ref 22–32)
Calcium: 9.6 mg/dL (ref 8.9–10.3)
Chloride: 108 mmol/L (ref 98–111)
Creatinine: 0.83 mg/dL (ref 0.44–1.00)
GFR, Estimated: >60 mL/min (ref >60)
Glucose, Bld: 94 mg/dL (ref 70–99)
Potassium: 4.3 mmol/L (ref 3.5–5.1)
Sodium: 140 mmol/L (ref 135–145)
Total Bilirubin: 0.6 mg/dL (ref 0.3–1.2)
Total Protein: 7.4 g/dL (ref 6.5–8.1)

## 2020-06-06 LAB — SEDIMENTATION RATE: Sed Rate: 24 mm/hr — ABNORMAL HIGH (ref 0–22)

## 2020-06-06 NOTE — Progress Notes (Signed)
HEMATOLOGY/ONCOLOGY CLINIC NOTE  Date of Service: .08/06/2019  Patient Care Team: Kathyrn Lass, MD as PCP - General (Family Medicine)  CHIEF COMPLAINTS/PURPOSE OF CONSULTATION:  F/u for continued mx of  Hodgkin's Lymphoma   HISTORY OF PRESENTING ILLNESS:   Jennifer Juarez is a wonderful 74 y.o. female who has been referred to [place] by Dr. Lattie Haw Miller/Courtney Rolland Porter PA_C  for evaluation and management of likely newly diagnosed Hodgkin's Lymphoma.   The pt reports that she is doing very well overall and notes that she is fully functional and does not have any significant chronic medical problems or any functional limitations.  She notes that on 06/26/17 she noticed a knot on the left side of her neck that subsequently precipitated her CT scan and Bx prior to seeing [place] today.   On 07/28/17 the pt had a CT Soft Tissue Neck revealing Enlarged lymph nodes in the left neck compatible with neoplasm.Enlarged lymph nodes in the left neck. Large left level 2 lymph node 23 x 30 mm. Multiple posterior lymph nodes are present measuring up to 10 mm. No enlarged lymph nodes in the right neck. Biopsy recommended. Attention left tonsil on direct mucosal inspection. Possible tonsillar carcinoma on the left. Lymphoma also in the differential.    Of note prior to the patient's visit, pt has had a Lymph node Needle/Core biopsy completed on 08/12/17 with results revealing ATYPICAL LYMPHOID PROLIFERATION. Immunohistochemical stains were performed including LCA, CD20, PAX-5, CD3, CD15, and CD30 with appropriate controls. The large atypical lymphoid-appearing cells are positive for CD30, CD15 and PAX-5 and negative for CD3, CD20 and LCA. The lymphocytic population in the background show a mixture of T and B-cells with predominance of T-cells. The overall findings are limited but atypical and worrisome for a lymphoproliferative process, particularly. classical Hodgkin lymphoma. Excisional biopsy is strongly  recommended.  Also on 08/12/17 the pt had a Tissue Flow Cytometry revealing NO MONOCLONAL B-CELL POPULATION OR ABNORMAL T-CELL PHENOTYPE IDENTIFIED.   Most recent CBC with Diff. results (07/15/17) revealed all values WNL.   She notes that she has had a viral infection in 2005 that resulted in a complete loss of hearing in her right ear. She notes that her left ear has had fluid build up and has resulted in significant loss of hearing, for which she uses hearing aids.  On review of systems, pt reports good energy levels, and denies fevers, chills, night sweats, unexpected weight loss, skin itching, rashes, soreness, sore throat, difficulties swallowing, back pains, abdominal pains, leg swelling, and any other symptoms.   On PMHx the pt reports arthritis, vertigo, polyps. She denies heart, lung, kidney or liver problems. She denies DM.  On Social Hx the pt notes having quit smoking when she was 64. She reports infrequent EOH consumption. She denies chemical or radiation exposure.  On Family Hx she reports that her mother had liver cancer. Her sister had breast cancer at 6 y/o and pancreatic cancer at 76 y/o.  Interval History:  Jennifer Juarez is here for a scheduled follow-up of her classical Hodgkin's Lymphoma. The patient's last visit with [place] was on 12/06/2019. The pt reports that she is doing well overall.  The pt reports that she has been well and only complains of acid reflux at this time. She is taking an acid suppressant to address her reflux. Pt has received her annual flu vaccine, as well as the COVID19 vaccines and booster. Pt continues receiving yearly Mammograms with her PCP.   Lab results  today (06/06/20) of CBC w/diff and CMP is as follows: all values are WNL. 06/06/2020 Sed Rate at 24  On review of systems, pt reports acid reflux and denies skin itching, rash, unexpected weight loss, new lumps/bumps, fevers, chills, night sweats, abdominal pain, constipation, diarrhea and any other symptoms.    MEDICAL HISTORY:  Past Medical History:  Diagnosis Date   BPPV (benign paroxysmal positional vertigo)    Cancer (HCC)    H/O seasonal allergies    Hearing loss in right ear    Left cervical lymphadenopathy    Macular degeneration    Obesity    Osteopenia    Pre-syncope 11/09/2017   Retinal vein occlusion    Vitamin D deficiency     SURGICAL HISTORY: Past Surgical History:  Procedure Laterality Date   CATARACT EXTRACTION, BILATERAL     CHOLECYSTECTOMY     IR FLUORO GUIDE PORT INSERTION RIGHT  10/10/2017   IR REMOVAL TUN ACCESS W/ PORT W/O FL MOD SED  02/05/2018   IR US GUIDE VASC ACCESS RIGHT  10/10/2017   MASS EXCISION Left 09/22/2017   Procedure: LEFT CERVICAL LYMPH NODE EXCISION;  Surgeon: Izora Gala, MD;  Location: [facility];  Service: ENT;  Laterality: Left;   TUBAL LIGATION      SOCIAL HISTORY: Social History   Socioeconomic History   Marital status: Single    Spouse name: Not on file   Number of children: Not on file   Years of education: Not on file   Highest education level: Not on file  Occupational History   Not on file  Tobacco Use   Smoking status: Former Smoker    Quit date: 06/24/1988    Years since quitting: 31.9   Smokeless tobacco: Never Used  Vaping Use   Vaping Use: Never used  Substance and Sexual Activity   Alcohol use: No   Drug use: No   Sexual activity: Not on file  Other Topics Concern   Not on file  Social History Narrative   Not on file   Social Determinants of Health   Financial Resource Strain: Not on file  Food Insecurity: Not on file  Transportation Needs: Not on file  Physical Activity: Not on file  Stress: Not on file  Social Connections: Not on file  Intimate Partner Violence: Not on file    FAMILY HISTORY: Family History  Problem Relation Age of Onset   Cancer Mother        liver   Heart attack Father    Hypertension Father    Diabetes Mellitus I Father     Cancer Sister        pancreatic, breast   Diabetes Mellitus I Sister    Heart disease Brother    Heart disease Brother    Heart disease Brother    Diabetes Mellitus I Sister    Diabetes Mellitus I Sister     ALLERGIES:  is allergic to cephalexin, doxycycline, levofloxacin, prilosec [omeprazole], and singulair [montelukast].  MEDICATIONS:  Current Outpatient Medications  Medication Sig Dispense Refill   acetaminophen (TYLENOL) 500 MG tablet Take 500 mg by mouth every 8 (eight) hours as needed for mild pain or moderate pain.      atorvastatin (LIPITOR) 10 MG tablet Take 10 mg by mouth daily.     azithromycin (ZITHROMAX Z-PAK) 250 MG tablet Take 2 tablets by mouth on day one, then take 1 tablet daily for 4 days. 6 each 0   dexamethasone (DECADRON) 4 MG  tablet Take 2 tablets by mouth once a day on the day after chemotherapy and then take 2 tablets two times a day for 2 days. Take with food. (Patient not taking: Reported on 04/22/2018) 30 tablet 1   lidocaine-prilocaine (EMLA) cream Apply to affected area once (Patient taking differently: Apply 1 application topically daily as needed. Port) 30 g 3   loratadine (CLARITIN) 10 MG tablet Take 10 mg by mouth daily.     LORazepam (ATIVAN) 0.5 MG tablet Take 1 tablet (0.5 mg total) by mouth every 8 (eight) hours as needed for anxiety (nausea). 30 tablet 0   ondansetron (ZOFRAN) 8 MG tablet Take 1 tablet (8 mg total) by mouth 2 (two) times daily as needed. Start on the third day after chemotherapy. 30 tablet 1   polyethylene glycol (MIRALAX / GLYCOLAX) packet Take 17 g by mouth daily.     prochlorperazine (COMPAZINE) 10 MG tablet Take 1 tablet (10 mg total) by mouth every 6 (six) hours as needed (Nausea or vomiting). (Patient not taking: Reported on 04/22/2018) 30 tablet 1   promethazine (PHENERGAN) 25 MG suppository Place 1 suppository (25 mg total) rectally every 6 (six) hours as needed for nausea or vomiting. 12 suppository 1    triamcinolone (NASACORT AQ) 55 MCG/ACT AERO nasal inhaler Place 2 sprays into the nose daily as needed (allergies).      No current facility-administered medications for this visit.    REVIEW OF SYSTEMS:   A 10+ POINT REVIEW OF SYSTEMS WAS OBTAINED including neurology, dermatology, psychiatry, cardiac, respiratory, lymph, extremities, GI, GU, Musculoskeletal, constitutional, breasts, reproductive, HEENT.  All pertinent positives are noted in the HPI.  All others are negative.   PHYSICAL EXAMINATION: ECOG PERFORMANCE STATUS: 1 BP (!) 147/84 (BP Location: Left Arm, Patient Position: Sitting)    Pulse 78    Temp 98 F (36.7 C) (Tympanic)    Resp 17    Wt 207 lb 12.8 oz (94.3 kg)    SpO2 99%    BMI 31.60 kg/m    GENERAL:alert, in no acute distress and comfortable SKIN: no acute rashes, no significant lesions EYES: conjunctiva are pink and non-injected, sclera anicteric OROPHARYNX: MMM, no exudates, no oropharyngeal erythema or ulceration NECK: supple, no JVD LYMPH:  no palpable lymphadenopathy in the cervical, axillary or inguinal regions LUNGS: clear to auscultation b/l with normal respiratory effort HEART: regular rate & rhythm ABDOMEN:  normoactive bowel sounds , non tender, not distended. No palpable hepatosplenomegaly.  Extremity: no pedal edema PSYCH: alert & oriented x 3 with fluent speech NEURO: no focal motor/sensory deficits  LABORATORY DATA:  I have reviewed the data as listed  . CBC Latest Ref Rng & Units 06/06/2020 12/06/2019 08/06/2019  WBC 4.0 - 10.5 K/uL 6.3 6.5 7.1  Hemoglobin 12.0 - 15.0 g/dL 12.0 12.4 12.6  Hematocrit 36.0 - 46.0 % 37.7 38.6 39.4  Platelets 150 - 400 K/uL 296 301 316   . CMP Latest Ref Rng & Units 06/06/2020 12/06/2019 08/06/2019  Glucose 70 - 99 mg/dL 94 100(H) 99  BUN 8 - 23 mg/dL 9 10 10   Creatinine 0.44 - 1.00 mg/dL 0.83 0.83 0.85  Sodium 135 - 145 mmol/L 140 141 140  Potassium 3.5 - 5.1 mmol/L 4.3 4.2 4.1  Chloride 98 - 111 mmol/L 108 106  107  CO2 22 - 32 mmol/L 25 26 27   Calcium 8.9 - 10.3 mg/dL 9.6 9.5 9.2  Total Protein 6.5 - 8.1 g/dL 7.4 7.1 7.3  Total Bilirubin 0.3 -  1.2 mg/dL 0.6 0.4 0.3  Alkaline Phos 38 - 126 U/L 80 81 95  AST 15 - 41 U/L 22 20 19   ALT 0 - 44 U/L 13 12 12        08/12/17 Lymph Node Needle/core Biopsy:   08/12/17 Tissue Flow Cytometry:    09/22/17 Tissue Flow Cytometry    09/22/17 Lymph Node Pathology:   RADIOGRAPHIC STUDIES: I have personally reviewed the radiological images as listed and agreed with the findings in the report. No results found.  ASSESSMENT & PLAN:   74 y.o. female with  1. Stage IA Hodgkin's Lymphoma (mixed cellularity) No constitutional symptoms Sed rate elevated to 30 PET findings most consistent with Stage I disease; discussed that a small hypermetabolic nodule found in her liver has remain unchanged for 4 years and is not considered to be involved with her Hodgkin's lymphoma.  -Left cervical lymph nodes are involved without further involvement.  01/15/18 PET/CT  revealed Interval complete response of left cervical hypermetabolic lymphadenopathy, now [place] 2. Stable 1.9 cm hypermetabolic retroperitoneal soft tissue nodule in the posterior right upper quadrant. Indeterminate. Tiny left groin lymph nodes are unchanged in size since prior PET-CT and 1 of these demonstrates a clear central fatty hilum. Both are FDG avid on today's exam (not seen previously). Given response of cervical disease, these features may reflect reactive etiology and attention on follow-up suggested. Stable fusiform descending thoracic aortic aneurysm. Follow-up CT in 1 year could be used to reassess.   PLAN: -Discussed pt labwork today, 06/06/20; blood counts and chemistries are nml, Sed Rate is borderline elevated but stable. -No lab or clinical evidence of Classical Hodgkin's Lymphoma recurrence at this time. Will continue watchful observation. -Pt is over 2 years out from last treatment.  Due to stability of disease will continue f/u every q76months.   -Will see back in 6 months with labs    FOLLOW UP: RTC with Dr Irene Limbo with labs in 6 months   The total time spent in the appt was 20 minutes and more than 50% was on counseling and direct patient cares.  All of the patient's questions were answered with apparent satisfaction. The patient knows to call the clinic with any problems, questions or concerns.    Sullivan Lone MD [place] AAHIVMS [facility] Hematology/Oncology [facility]  (Office):       [phone] (Work cell):  [phone] (Fax):           [phone]  06/06/2020 1:21 PM  I, Yevette Edwards, am acting as a scribe for Dr. Sullivan Lone.   .I have reviewed the above documentation for accuracy and completeness, and I agree with the above. Brunetta Genera MD

## 2020-06-19 DIAGNOSIS — Z20822 Contact with and (suspected) exposure to covid-19: Secondary | ICD-10-CM | POA: Diagnosis not present

## 2020-07-18 ENCOUNTER — Other Ambulatory Visit: Payer: Self-pay

## 2020-07-18 ENCOUNTER — Ambulatory Visit (INDEPENDENT_AMBULATORY_CARE_PROVIDER_SITE_OTHER): Payer: Medicare HMO | Admitting: Ophthalmology

## 2020-07-18 DIAGNOSIS — H33322 Round hole, left eye: Secondary | ICD-10-CM

## 2020-07-18 DIAGNOSIS — H348312 Tributary (branch) retinal vein occlusion, right eye, stable: Secondary | ICD-10-CM

## 2020-07-18 NOTE — Assessment & Plan Note (Signed)
Observe

## 2020-07-18 NOTE — Progress Notes (Signed)
07/18/2020     CHIEF COMPLAINT Patient presents for Retina Follow Up (1 YR FU OU///Pt reports stable vision OU, pt denies any new F/F, pain, or pressure OU. )   HISTORY OF PRESENT ILLNESS: Jennifer Juarez is a 75 y.o. female who presents to the clinic today for:   HPI    Retina Follow Up    Patient presents with  Other.  In both eyes.  This started 1 year ago.  Duration of 1 year.  Since onset it is stable. Additional comments: 1 YR FU OU   Pt reports stable vision OU, pt denies any new F/F, pain, or pressure OU.        Last edited by Nichola Sizer D on 07/18/2020 10:02 AM. (History)      Referring physician: Kathyrn Lass, MD [place],  [place] [id number]  HISTORICAL INFORMATION:   Selected notes from the MEDICAL RECORD NUMBER       CURRENT MEDICATIONS: No current outpatient medications on file. (Ophthalmic Drugs)   No current facility-administered medications for this visit. (Ophthalmic Drugs)   Current Outpatient Medications (Other)  Medication Sig  . acetaminophen (TYLENOL) 500 MG tablet Take 500 mg by mouth every 8 (eight) hours as needed for mild pain or moderate pain.   Marland Kitchen atorvastatin (LIPITOR) 10 MG tablet Take 10 mg by mouth daily.  Marland Kitchen azithromycin (ZITHROMAX Z-PAK) 250 MG tablet Take 2 tablets by mouth on day one, then take 1 tablet daily for 4 days.  Marland Kitchen dexamethasone (DECADRON) 4 MG tablet Take 2 tablets by mouth once a day on the day after chemotherapy and then take 2 tablets two times a day for 2 days. Take with food. (Patient not taking: Reported on 04/22/2018)  . lidocaine-prilocaine (EMLA) cream Apply to affected area once (Patient taking differently: Apply 1 application topically daily as needed. Port)  . loratadine (CLARITIN) 10 MG tablet Take 10 mg by mouth daily.  Marland Kitchen LORazepam (ATIVAN) 0.5 MG tablet Take 1 tablet (0.5 mg total) by mouth every 8 (eight) hours as needed for anxiety (nausea).  . ondansetron (ZOFRAN) 8 MG tablet Take 1 tablet  (8 mg total) by mouth 2 (two) times daily as needed. Start on the third day after chemotherapy.  . polyethylene glycol (MIRALAX / GLYCOLAX) packet Take 17 g by mouth daily.  . prochlorperazine (COMPAZINE) 10 MG tablet Take 1 tablet (10 mg total) by mouth every 6 (six) hours as needed (Nausea or vomiting). (Patient not taking: Reported on 04/22/2018)  . promethazine (PHENERGAN) 25 MG suppository Place 1 suppository (25 mg total) rectally every 6 (six) hours as needed for nausea or vomiting.  . triamcinolone (NASACORT AQ) 55 MCG/ACT AERO nasal inhaler Place 2 sprays into the nose daily as needed (allergies).    No current facility-administered medications for this visit. (Other)      REVIEW OF SYSTEMS:    ALLERGIES Allergies  Allergen Reactions  . Cephalexin Anaphylaxis    Tongue and throat swelling  . Doxycycline Diarrhea and Nausea And Vomiting  . Levofloxacin Other (See Comments)    Tachycardia  . Prilosec [Omeprazole] Other (See Comments)    Makes pt dizzy   ** Any Family Meds  . Singulair [Montelukast]     Rapid Heart rate    PAST MEDICAL HISTORY Past Medical History:  Diagnosis Date  . BPPV (benign paroxysmal positional vertigo)   . Cancer ([place])   . H/O seasonal allergies   . Hearing loss in right ear   .  Left cervical lymphadenopathy   . Macular degeneration   . Obesity   . Osteopenia   . Pre-syncope 11/09/2017  . Retinal vein occlusion   . Vitamin D deficiency    Past Surgical History:  Procedure Laterality Date  . CATARACT EXTRACTION, BILATERAL    . CHOLECYSTECTOMY    . IR FLUORO GUIDE PORT INSERTION RIGHT  10/10/2017  . IR REMOVAL TUN ACCESS W/ PORT W/O FL MOD SED  02/05/2018  . IR US GUIDE VASC ACCESS RIGHT  10/10/2017  . MASS EXCISION Left 09/22/2017   Procedure: LEFT CERVICAL LYMPH NODE EXCISION;  Surgeon: Izora Gala, MD;  Location: [facility];  Service: ENT;  Laterality: Left;  . TUBAL LIGATION      FAMILY HISTORY Family History   Problem Relation Age of Onset  . Cancer Mother        liver  . Heart attack Father   . Hypertension Father   . Diabetes Mellitus I Father   . Cancer Sister        pancreatic, breast  . Diabetes Mellitus I Sister   . Heart disease Brother   . Heart disease Brother   . Heart disease Brother   . Diabetes Mellitus I Sister   . Diabetes Mellitus I Sister     SOCIAL HISTORY Social History   Tobacco Use  . Smoking status: Former Smoker    Quit date: 06/24/1988    Years since quitting: 32.0  . Smokeless tobacco: Never Used  Vaping Use  . Vaping Use: Never used  Substance Use Topics  . Alcohol use: No  . Drug use: No         OPHTHALMIC EXAM: Base Eye Exam    Visual Acuity (ETDRS)      Right Left   Dist [place] 20/30 +2 20/20 -1   Dist ph [place] NI        Tonometry (Tonopen, 10:07 AM)      Right Left   Pressure 18 15       Pupils      Pupils Dark Light Shape React APD   Right PERRL 3 3 Round Brisk None   Left PERRL 3 3 Round Brisk None       Visual Fields (Counting fingers)      Left Right    Full Full       Extraocular Movement      Right Left    Full Full       Neuro/Psych    Oriented x3: Yes       Dilation    Both eyes: 1.0% Mydriacyl, 2.5% Phenylephrine @ 10:07 AM        Slit Lamp and Fundus Exam    External Exam      Right Left   External Normal Normal       Slit Lamp Exam      Right Left   Lids/Lashes Normal Normal   Conjunctiva/Sclera White and quiet White and quiet   Cornea Clear Clear   Anterior Chamber Deep and quiet Deep and quiet   Iris Round and reactive Round and reactive   Lens Centered posterior chamber intraocular lens, PC haze +1, 1+ Posterior capsular opacification Centered posterior chamber intraocular lens, 1+ Posterior capsular opacification   Anterior Vitreous Normal Normal       Fundus Exam      Right Left   Posterior Vitreous Posterior vitreous detachment Posterior vitreous detachment   Disc Normal Peripapillary atrophy  C/D Ratio 0.1 0.1   Macula Normal, no CME, focal laser scars inferotemporal Normal   Vessels Old inferotemporal branch retinal vein occlusion, good sector photocoagulation in this region, no active disease Normal   Periphery Good sector PRP inferotemporal. Retinal break, operculated at 3-33 meridian near the equator good retinopexy no new breaks          IMAGING AND PROCEDURES  Imaging and Procedures for 07/18/20  Color Fundus Photography Optos - OU - Both Eyes       Right Eye Progression has been stable. Disc findings include normal observations. Macula : normal observations.   Left Eye Progression has been stable. Disc findings include normal observations. Macula : normal observations. Vessels : normal observations.   Notes Old inferotemporal branch retinal vein occlusion, no neovascularization no CME OD  OS, retinal break temporally  Well surrounded by laser retinopexy.                ASSESSMENT/PLAN:  Retinal hole, left Observe  Stable branch retinal vein occlusion of right eye Observe      ICD-10-CM   1. Stable branch retinal vein occlusion of right eye  Z30.8657 Color Fundus Photography Optos - OU - Both Eyes  2. Retinal hole, left  H33.322 Color Fundus Photography Optos - OU - Both Eyes    1.  Condition stable OD, no new findings.  2.  OS, no new retinal breaks will continue to observe  3.  Ophthalmic Meds Ordered this visit:  No orders of the defined types were placed in this encounter.      Return in about 1 year (around 07/18/2021) for COLOR FP, OCT, DILATE OU.  There are no Patient Instructions on file for this visit.   Explained the diagnoses, plan, and follow up with the patient and they expressed understanding.  Patient expressed understanding of the importance of proper follow up care.   Clent Demark Antavious Spanos M.D. Diseases & Surgery of the Retina and Vitreous Retina [facility] Diabetic [place] 07/18/20     Abbreviations: M myopia  (nearsighted); A astigmatism; H hyperopia (farsighted); P presbyopia; Mrx spectacle prescription;  CTL contact lenses; OD right eye; OS left eye; OU both eyes  XT exotropia; ET esotropia; PEK punctate epithelial keratitis; PEE punctate epithelial erosions; DES dry eye syndrome; MGD meibomian gland dysfunction; ATs artificial tears; PFAT's preservative free artificial tears; [place] nuclear sclerotic cataract; PSC posterior subcapsular cataract; ERM epi-retinal membrane; PVD posterior vitreous detachment; RD retinal detachment; DM diabetes mellitus; DR diabetic retinopathy; NPDR non-proliferative diabetic retinopathy; PDR proliferative diabetic retinopathy; CSME clinically significant macular edema; DME diabetic macular edema; dbh dot blot hemorrhages; CWS cotton wool spot; POAG primary open angle glaucoma; C/D cup-to-disc ratio; HVF humphrey visual field; GVF goldmann visual field; OCT optical coherence tomography; IOP intraocular pressure; BRVO Branch retinal vein occlusion; CRVO central retinal vein occlusion; CRAO central retinal artery occlusion; BRAO branch retinal artery occlusion; RT retinal tear; SB scleral buckle; PPV pars plana vitrectomy; VH Vitreous hemorrhage; PRP panretinal laser photocoagulation; IVK intravitreal kenalog; VMT vitreomacular traction; MH Macular hole;  NVD neovascularization of the disc; NVE neovascularization elsewhere; AREDS age related eye disease study; ARMD age related macular degeneration; POAG primary open angle glaucoma; EBMD epithelial/anterior basement membrane dystrophy; ACIOL anterior chamber intraocular lens; IOL intraocular lens; PCIOL posterior chamber intraocular lens; Phaco/IOL phacoemulsification with intraocular lens placement; [place] photorefractive keratectomy; LASIK laser assisted in situ keratomileusis; HTN hypertension; DM diabetes mellitus; COPD chronic obstructive pulmonary disease

## 2020-07-18 NOTE — Patient Instructions (Signed)
Patient instructed to follow-up promptly new onset visual acuity decline or distortion

## 2020-08-16 DIAGNOSIS — L2089 Other atopic dermatitis: Secondary | ICD-10-CM | POA: Diagnosis not present

## 2020-08-16 DIAGNOSIS — Z85828 Personal history of other malignant neoplasm of skin: Secondary | ICD-10-CM | POA: Diagnosis not present

## 2020-08-16 DIAGNOSIS — L814 Other melanin hyperpigmentation: Secondary | ICD-10-CM | POA: Diagnosis not present

## 2020-08-16 DIAGNOSIS — D1801 Hemangioma of skin and subcutaneous tissue: Secondary | ICD-10-CM | POA: Diagnosis not present

## 2020-08-16 DIAGNOSIS — L84 Corns and callosities: Secondary | ICD-10-CM | POA: Diagnosis not present

## 2020-08-16 DIAGNOSIS — D229 Melanocytic nevi, unspecified: Secondary | ICD-10-CM | POA: Diagnosis not present

## 2020-08-16 DIAGNOSIS — L821 Other seborrheic keratosis: Secondary | ICD-10-CM | POA: Diagnosis not present

## 2020-08-16 DIAGNOSIS — X32XXXS Exposure to sunlight, sequela: Secondary | ICD-10-CM | POA: Diagnosis not present

## 2020-08-28 DIAGNOSIS — K5900 Constipation, unspecified: Secondary | ICD-10-CM | POA: Diagnosis not present

## 2020-08-28 DIAGNOSIS — K2100 Gastro-esophageal reflux disease with esophagitis, without bleeding: Secondary | ICD-10-CM | POA: Diagnosis not present

## 2020-08-31 ENCOUNTER — Other Ambulatory Visit: Payer: Self-pay | Admitting: Physician Assistant

## 2020-08-31 DIAGNOSIS — R110 Nausea: Secondary | ICD-10-CM | POA: Diagnosis not present

## 2020-08-31 DIAGNOSIS — R1013 Epigastric pain: Secondary | ICD-10-CM

## 2020-08-31 DIAGNOSIS — K5900 Constipation, unspecified: Secondary | ICD-10-CM | POA: Diagnosis not present

## 2020-09-12 ENCOUNTER — Other Ambulatory Visit: Payer: Self-pay

## 2020-09-12 ENCOUNTER — Ambulatory Visit
Admission: RE | Admit: 2020-09-12 | Discharge: 2020-09-12 | Disposition: A | Discharge status: Home / Self Care | Payer: Medicare HMO | Source: Ambulatory Visit | Attending: Physician Assistant | Admitting: Physician Assistant

## 2020-09-12 DIAGNOSIS — Z8572 Personal history of non-Hodgkin lymphomas: Secondary | ICD-10-CM | POA: Diagnosis not present

## 2020-09-12 DIAGNOSIS — I712 Thoracic aortic aneurysm, without rupture: Secondary | ICD-10-CM | POA: Diagnosis not present

## 2020-09-12 DIAGNOSIS — R1013 Epigastric pain: Secondary | ICD-10-CM

## 2020-09-12 DIAGNOSIS — E278 Other specified disorders of adrenal gland: Secondary | ICD-10-CM | POA: Diagnosis not present

## 2020-09-12 DIAGNOSIS — I714 Abdominal aortic aneurysm, without rupture: Secondary | ICD-10-CM | POA: Diagnosis not present

## 2020-09-12 MED ORDER — IOPAMIDOL (ISOVUE-300) INJECTION 61%
100.0000 mL | Freq: Once | INTRAVENOUS | Status: AC | PRN
  Administered 2020-09-12: 100 mL via INTRAVENOUS

## 2020-09-21 DIAGNOSIS — K219 Gastro-esophageal reflux disease without esophagitis: Secondary | ICD-10-CM | POA: Diagnosis not present

## 2020-09-21 DIAGNOSIS — K5900 Constipation, unspecified: Secondary | ICD-10-CM | POA: Diagnosis not present

## 2020-09-21 DIAGNOSIS — R1319 Other dysphagia: Secondary | ICD-10-CM | POA: Diagnosis not present

## 2020-09-21 DIAGNOSIS — R112 Nausea with vomiting, unspecified: Secondary | ICD-10-CM | POA: Diagnosis not present

## 2020-10-18 DIAGNOSIS — K222 Esophageal obstruction: Secondary | ICD-10-CM | POA: Diagnosis not present

## 2020-10-18 DIAGNOSIS — R1310 Dysphagia, unspecified: Secondary | ICD-10-CM | POA: Diagnosis not present

## 2020-10-19 ENCOUNTER — Telehealth: Payer: Self-pay | Admitting: Hematology

## 2020-10-19 NOTE — Telephone Encounter (Signed)
R/Sch per 12/14 los,  Patient aware

## 2020-11-14 DIAGNOSIS — I700 Atherosclerosis of aorta: Secondary | ICD-10-CM | POA: Diagnosis not present

## 2020-11-14 DIAGNOSIS — I712 Thoracic aortic aneurysm, without rupture: Secondary | ICD-10-CM | POA: Diagnosis not present

## 2020-11-14 DIAGNOSIS — Z6831 Body mass index (BMI) 31.0-31.9, adult: Secondary | ICD-10-CM | POA: Diagnosis not present

## 2020-11-14 DIAGNOSIS — J309 Allergic rhinitis, unspecified: Secondary | ICD-10-CM | POA: Diagnosis not present

## 2020-11-14 DIAGNOSIS — Z8579 Personal history of other malignant neoplasms of lymphoid, hematopoietic and related tissues: Secondary | ICD-10-CM | POA: Diagnosis not present

## 2020-11-14 DIAGNOSIS — Z0000 Encounter for general adult medical examination without abnormal findings: Secondary | ICD-10-CM | POA: Diagnosis not present

## 2020-11-14 DIAGNOSIS — Z1389 Encounter for screening for other disorder: Secondary | ICD-10-CM | POA: Diagnosis not present

## 2020-11-14 DIAGNOSIS — E559 Vitamin D deficiency, unspecified: Secondary | ICD-10-CM | POA: Diagnosis not present

## 2020-11-14 DIAGNOSIS — E7800 Pure hypercholesterolemia, unspecified: Secondary | ICD-10-CM | POA: Diagnosis not present

## 2020-11-14 DIAGNOSIS — E538 Deficiency of other specified B group vitamins: Secondary | ICD-10-CM | POA: Diagnosis not present

## 2020-11-14 DIAGNOSIS — E669 Obesity, unspecified: Secondary | ICD-10-CM | POA: Diagnosis not present

## 2020-11-14 DIAGNOSIS — K219 Gastro-esophageal reflux disease without esophagitis: Secondary | ICD-10-CM | POA: Diagnosis not present

## 2020-11-15 DIAGNOSIS — K2100 Gastro-esophageal reflux disease with esophagitis, without bleeding: Secondary | ICD-10-CM | POA: Diagnosis not present

## 2020-11-15 DIAGNOSIS — I712 Thoracic aortic aneurysm, without rupture: Secondary | ICD-10-CM | POA: Diagnosis not present

## 2020-11-15 DIAGNOSIS — K222 Esophageal obstruction: Secondary | ICD-10-CM | POA: Diagnosis not present

## 2020-12-06 ENCOUNTER — Other Ambulatory Visit: Payer: Medicare HMO

## 2020-12-06 ENCOUNTER — Ambulatory Visit: Payer: Medicare HMO | Admitting: Hematology

## 2020-12-14 ENCOUNTER — Other Ambulatory Visit: Payer: Self-pay | Admitting: Cardiothoracic Surgery

## 2020-12-14 ENCOUNTER — Other Ambulatory Visit: Payer: Self-pay

## 2020-12-14 ENCOUNTER — Encounter: Payer: Self-pay | Admitting: Cardiothoracic Surgery

## 2020-12-14 ENCOUNTER — Institutional Professional Consult (permissible substitution): Payer: Medicare HMO | Admitting: Cardiothoracic Surgery

## 2020-12-14 DIAGNOSIS — I712 Thoracic aortic aneurysm, without rupture, unspecified: Secondary | ICD-10-CM | POA: Insufficient documentation

## 2020-12-14 NOTE — Progress Notes (Signed)
[address]       [place] [address]             [phone]     CARDIOTHORACIC SURGERY office visitation  Referring Provider is Kathyrn Lass, MD Primary Cardiologist is None PCP is Kathyrn Lass, MD  Chief Complaint  Patient presents with   Thoracic Aortic Aneurysm    New patient consultation chest CT 09/12/20    HPI:  75 year old lady is referred for evaluation of thoracic aneurysm.  She has a history of Hodgkin's lymphoma several years ago.  She was being evaluated recently for swallowing disorders and gastric reflux.  She underwent a CT scan of the abdomen which demonstrated a thoracoabdominal aneurysm.  The cuts of the ascending aorta were without aneurysm disease.  The arch of the aorta or the proximal descending aorta could not be assessed due to the techniques of the scan.  She is not diabetic, hypertensive and has no history of aneurysm disease personally or in her family.  Past Medical History:  Diagnosis Date   BPPV (benign paroxysmal positional vertigo)    Cancer (HCC)    H/O seasonal allergies    Hearing loss in right ear    Left cervical lymphadenopathy    Macular degeneration    Obesity    Osteopenia    Pre-syncope 11/09/2017   Retinal vein occlusion    Vitamin D deficiency     Past Surgical History:  Procedure Laterality Date   CATARACT EXTRACTION, BILATERAL     CHOLECYSTECTOMY     IR FLUORO GUIDE PORT INSERTION RIGHT  10/10/2017   IR REMOVAL TUN ACCESS W/ PORT W/O FL MOD SED  02/05/2018   IR US GUIDE VASC ACCESS RIGHT  10/10/2017   MASS EXCISION Left 09/22/2017   Procedure: LEFT CERVICAL LYMPH NODE EXCISION;  Surgeon: Izora Gala, MD;  Location: [place];  Service: ENT;  Laterality: Left;   TUBAL LIGATION      Family History  Problem Relation Age of Onset   Cancer Mother        liver   Heart attack Father    Hypertension Father    Diabetes Mellitus I Father    Cancer Sister        pancreatic, breast    Diabetes Mellitus I Sister    Heart disease Brother    Heart disease Brother    Heart disease Brother    Diabetes Mellitus I Sister    Diabetes Mellitus I Sister     Social History   Socioeconomic History   Marital status: Single    Spouse name: Not on file   Number of children: Not on file   Years of education: Not on file   Highest education level: Not on file  Occupational History   Not on file  Tobacco Use   Smoking status: Former    Pack years: 0.00    Types: Cigarettes    Quit date: 06/24/1988    Years since quitting: 32.4   Smokeless tobacco: Never  Vaping Use   Vaping Use: Never used  Substance and Sexual Activity   Alcohol use: No   Drug use: No   Sexual activity: Not on file  Other Topics Concern   Not on file  Social History Narrative   Not on file   Social Determinants of Health   Financial Resource Strain: Not on file  Food Insecurity: Not on file  Transportation Needs: Not on file  Physical Activity: Not on file  Stress: Not on file  Social Connections: Not on file  Intimate Partner Violence: Not on file    Current Outpatient Medications  Medication Sig Dispense Refill   acetaminophen (TYLENOL) 500 MG tablet Take 500 mg by mouth every 8 (eight) hours as needed for mild pain or moderate pain.      atorvastatin (LIPITOR) 10 MG tablet      polyethylene glycol (MIRALAX / GLYCOLAX) packet Take 17 g by mouth daily.     triamcinolone (NASACORT) 55 MCG/ACT AERO nasal inhaler Place 2 sprays into the nose daily as needed (allergies).      No current facility-administered medications for this visit.    Allergies  Allergen Reactions   Cephalexin Anaphylaxis    Tongue and throat swelling   Doxycycline Diarrhea and Nausea And Vomiting   Levofloxacin Other (See Comments)    Tachycardia   Prilosec [Omeprazole] Other (See Comments)    Makes pt dizzy   ** Any Family Meds   Singulair [Montelukast]     Rapid Heart rate      Review of  Systems:   General:  No weight change; good energy level  Cardiac:  Denies chest pain or shortness of breath  Respiratory:  No cough or shortness of breath  GI:   History of frequent reflux  GU:   No kidney or bladder disease  Vascular:  No claudication or venous disease  Neuro:   Denies strokes or seizures  Musculoskeletal: History of osteopenia  Skin:   Negative  Psych:   Negative  Eyes:   Negative  ENT:   Right ear hearing loss  Hematologic:  History of lymphoma  Endocrine:  Not diabetic     Physical Exam:   BP (!) 147/76 (BP Location: Left Arm, Patient Position: Sitting, Cuff Size: Normal)   Pulse 73   Resp 20   Ht 5\' 8"  (1.727 m)   Wt 93.6 kg   SpO2 97% Comment: RA  BMI 31.38 kg/m   General:    well-appearing  HEENT:  Unremarkable   Neck:   no JVD, no bruits, no adenopathy   Chest:   clear to auscultation, symmetrical breath sounds, no wheezes, no rhonchi   CV:   RRR, no detectable murmur   Abdomen:  soft, non-tender, no masses   Extremities:  warm, well-perfused, pulses intact throughout, no LE edema  Rectal/GU  Deferred  Neuro:   Grossly non-focal and symmetrical throughout  Skin:   Clean and dry, no rashes, no breakdown   Diagnostic Tests:  I personally reviewed her CT scan from 3/22 which demonstrates a thoracoabdominal aneurysm with maximal diameter approximately 4.8 cm.  It is unclear how proximal this extends due to the nature of the CT scan performed.   Impression:  75 year old lady with thoracoabdominal aneurysm.   Plan:  CT chest in the near future to further assess ascending arch and descending aorta I will follow her up after the scan Refer to [facility] for consultation related to thoracoabdominal aortic aneurysm with idea for aortic stenting.  Devyn Sheerin Z. Orvan Seen, MD [phone]    I spent in excess of 30 minutes during the conduct of this office consultation and >50% of this time involved direct face-to-face encounter with the patient for  counseling and/or coordination of their care.          Level 3 Office Consult = 40 minutes         Level 4 Office Consult = 60 minutes  Level 5 Office Consult = 80 minutes  B.  Murvin Natal, MD 12/14/2020 4:44 PM

## 2020-12-19 ENCOUNTER — Telehealth: Payer: Self-pay | Admitting: Hematology

## 2020-12-19 NOTE — Telephone Encounter (Signed)
Left message with rescheduled upcoming appointment due to provider not in office. 

## 2020-12-21 ENCOUNTER — Other Ambulatory Visit: Payer: Medicare HMO

## 2020-12-21 ENCOUNTER — Ambulatory Visit: Payer: Self-pay | Admitting: Hematology

## 2020-12-27 ENCOUNTER — Other Ambulatory Visit: Payer: Medicare HMO

## 2020-12-31 NOTE — Progress Notes (Signed)
HEMATOLOGY/ONCOLOGY CLINIC NOTE  Date of Service: 01/01/2021  Patient Care Team: Kathyrn Lass, MD as PCP - General (Family Medicine)  CHIEF COMPLAINTS/PURPOSE OF CONSULTATION:  F/u for continued mx of  Hodgkin's Lymphoma   HISTORY OF PRESENTING ILLNESS:   Jennifer Juarez is a wonderful 75 y.o. female who has been referred to [place] by Dr. Lattie Haw Miller/Courtney Rolland Porter PA_C  for evaluation and management of likely newly diagnosed Hodgkin's Lymphoma.   The pt reports that she is doing very well overall and notes that she is fully functional and does not have any significant chronic medical problems or any functional limitations.  She notes that on 06/26/17 she noticed a knot on the left side of her neck that subsequently precipitated her CT scan and Bx prior to seeing [place] today.   On 07/28/17 the pt had a CT Soft Tissue Neck revealing Enlarged lymph nodes in the left neck compatible with neoplasm.Enlarged lymph nodes in the left neck. Large left level 2 lymph node 23 x 30 mm. Multiple posterior lymph nodes are present measuring up to 10 mm. No enlarged lymph nodes in the right neck. Biopsy recommended. Attention left tonsil on direct mucosal inspection. Possible tonsillar carcinoma on the left. Lymphoma also in the differential.    Of note prior to the patient's visit, pt has had a Lymph node Needle/Core biopsy completed on 08/12/17 with results revealing ATYPICAL LYMPHOID PROLIFERATION. Immunohistochemical stains were performed including LCA, CD20, PAX-5, CD3, CD15, and CD30 with appropriate controls. The large atypical lymphoid-appearing cells are positive for CD30, CD15 and PAX-5 and negative for CD3, CD20 and LCA. The lymphocytic population in the background show a mixture of T and B-cells with predominance of T-cells. The overall findings are limited but atypical and worrisome for a lymphoproliferative process, particularly. classical Hodgkin lymphoma. Excisional biopsy is strongly  recommended.  Also on 08/12/17 the pt had a Tissue Flow Cytometry revealing NO MONOCLONAL B-CELL POPULATION OR ABNORMAL T-CELL PHENOTYPE IDENTIFIED.   Most recent CBC with Diff. results (07/15/17) revealed all values WNL.   She notes that she has had a viral infection in 2005 that resulted in a complete loss of hearing in her right ear. She notes that her left ear has had fluid build up and has resulted in significant loss of hearing, for which she uses hearing aids.  On review of systems, pt reports good energy levels, and denies fevers, chills, night sweats, unexpected weight loss, skin itching, rashes, soreness, sore throat, difficulties swallowing, back pains, abdominal pains, leg swelling, and any other symptoms.   On PMHx the pt reports arthritis, vertigo, polyps. She denies heart, lung, kidney or liver problems. She denies DM.  On Social Hx the pt notes having quit smoking when she was 44. She reports infrequent EOH consumption. She denies chemical or radiation exposure.  On Family Hx she reports that her mother had liver cancer. Her sister had breast cancer at 56 y/o and pancreatic cancer at 77 y/o.  Interval History:   [name] is here for a scheduled follow-up of her classical Hodgkin's Lymphoma. The patient's last visit with [place] was on 06/06/2020. The pt reports that she is doing well overall.  The pt reports no new symptoms or concerns. She recently had a lot of acid reflux and they did a CT AP and found an incidental finding of an aneurysm in her chest. She has a f/u w vascular surgeon and a repeat CT Chest later this month. She had her first booster  on November 29.   Lab results today 01/01/2021 of CBC w/diff and CMP is as follows: all values are WNL.  On review of systems, pt denies fevers, chills, night sweats, acute skin rashes, itching, new lumps/bumps, abdominal pain, back pain, leg swelling, and any other symptoms.   MEDICAL HISTORY:  Past Medical History:  Diagnosis Date    BPPV (benign paroxysmal positional vertigo)    Cancer (HCC)    H/O seasonal allergies    Hearing loss in right ear    Left cervical lymphadenopathy    Macular degeneration    Obesity    Osteopenia    Pre-syncope 11/09/2017   Retinal vein occlusion    Vitamin D deficiency     SURGICAL HISTORY: Past Surgical History:  Procedure Laterality Date   CATARACT EXTRACTION, BILATERAL     CHOLECYSTECTOMY     IR FLUORO GUIDE PORT INSERTION RIGHT  10/10/2017   IR REMOVAL TUN ACCESS W/ PORT W/O FL MOD SED  02/05/2018   IR US GUIDE VASC ACCESS RIGHT  10/10/2017   MASS EXCISION Left 09/22/2017   Procedure: LEFT CERVICAL LYMPH NODE EXCISION;  Surgeon: Izora Gala, MD;  Location: [place];  Service: ENT;  Laterality: Left;   TUBAL LIGATION      SOCIAL HISTORY: Social History   Socioeconomic History   Marital status: Single    Spouse name: Not on file   Number of children: Not on file   Years of education: Not on file   Highest education level: Not on file  Occupational History   Not on file  Tobacco Use   Smoking status: Former    Pack years: 0.00    Types: Cigarettes    Quit date: 06/24/1988    Years since quitting: 32.5   Smokeless tobacco: Never  Vaping Use   Vaping Use: Never used  Substance and Sexual Activity   Alcohol use: No   Drug use: No   Sexual activity: Not on file  Other Topics Concern   Not on file  Social History Narrative   Not on file   Social Determinants of Health   Financial Resource Strain: Not on file  Food Insecurity: Not on file  Transportation Needs: Not on file  Physical Activity: Not on file  Stress: Not on file  Social Connections: Not on file  Intimate Partner Violence: Not on file    FAMILY HISTORY: Family History  Problem Relation Age of Onset   Cancer Mother        liver   Heart attack Father    Hypertension Father    Diabetes Mellitus I Father    Cancer Sister        pancreatic, breast   Diabetes Mellitus I Sister     Heart disease Brother    Heart disease Brother    Heart disease Brother    Diabetes Mellitus I Sister    Diabetes Mellitus I Sister     ALLERGIES:  is allergic to cephalexin, doxycycline, levofloxacin, prilosec [omeprazole], and singulair [montelukast].  MEDICATIONS:  Current Outpatient Medications  Medication Sig Dispense Refill   acetaminophen (TYLENOL) 500 MG tablet Take 500 mg by mouth every 8 (eight) hours as needed for mild pain or moderate pain.      atorvastatin (LIPITOR) 10 MG tablet      polyethylene glycol (MIRALAX / GLYCOLAX) packet Take 17 g by mouth daily.     triamcinolone (NASACORT) 55 MCG/ACT AERO nasal inhaler Place 2 sprays into the nose  daily as needed (allergies).      No current facility-administered medications for this visit.    REVIEW OF SYSTEMS:   10 Point review of Systems was done is negative except as noted above.  PHYSICAL EXAMINATION: ECOG PERFORMANCE STATUS: 1 BP 128/84 (BP Location: Left Arm, Patient Position: Sitting)   Pulse 80   Temp 98.3 F (36.8 C) (Oral)   Resp 19   Wt 206 lb 9.6 oz (93.7 kg)   SpO2 98%   BMI 31.41 kg/m    NAD. GENERAL:alert, in no acute distress and comfortable SKIN: no acute rashes, no significant lesions EYES: conjunctiva are pink and non-injected, sclera anicteric OROPHARYNX: MMM, no exudates, no oropharyngeal erythema or ulceration NECK: supple, no JVD LYMPH:  no palpable lymphadenopathy in the cervical, axillary or inguinal regions LUNGS: clear to auscultation b/l with normal respiratory effort HEART: regular rate & rhythm ABDOMEN:  normoactive bowel sounds , non tender, not distended. No palpable hepatosplenomegaly.  Extremity: no pedal edema PSYCH: alert & oriented x 3 with fluent speech NEURO: no focal motor/sensory deficits  LABORATORY DATA:  I have reviewed the data as listed  . CBC Latest Ref Rng & Units 01/01/2021 06/06/2020 12/06/2019  WBC 4.0 - 10.5 K/uL 6.7 6.3 6.5  Hemoglobin 12.0 - 15.0  g/dL 12.6 12.0 12.4  Hematocrit 36.0 - 46.0 % 38.3 37.7 38.6  Platelets 150 - 400 K/uL 279 296 301   . CMP Latest Ref Rng & Units 01/01/2021 06/06/2020 12/06/2019  Glucose 70 - 99 mg/dL 95 94 100(H)  BUN 8 - 23 mg/dL 9 9 10   Creatinine 0.44 - 1.00 mg/dL 0.79 0.83 0.83  Sodium 135 - 145 mmol/L 139 140 141  Potassium 3.5 - 5.1 mmol/L 4.2 4.3 4.2  Chloride 98 - 111 mmol/L 108 108 106  CO2 22 - 32 mmol/L 23 25 26   Calcium 8.9 - 10.3 mg/dL 9.6 9.6 9.5  Total Protein 6.5 - 8.1 g/dL 7.5 7.4 7.1  Total Bilirubin 0.3 - 1.2 mg/dL 0.7 0.6 0.4  Alkaline Phos 38 - 126 U/L 82 80 81  AST 15 - 41 U/L 18 22 20   ALT 0 - 44 U/L 8 13 12        08/12/17 Lymph Node Needle/core Biopsy:   08/12/17 Tissue Flow Cytometry:    09/22/17 Tissue Flow Cytometry    09/22/17 Lymph Node Pathology:   RADIOGRAPHIC STUDIES: I have personally reviewed the radiological images as listed and agreed with the findings in the report. No results found.  ASSESSMENT & PLAN:   75 y.o. female with  1. Stage IA Hodgkin's Lymphoma (mixed cellularity) No constitutional symptoms Sed rate elevated to 30 PET findings most consistent with Stage I disease; discussed that a small hypermetabolic nodule found in her liver has remain unchanged for 4 years and is not considered to be involved with her Hodgkin's lymphoma.  -Left cervical lymph nodes are involved without further involvement.  01/15/18 PET/CT  revealed Interval complete response of left cervical hypermetabolic lymphadenopathy, now [place] 2. Stable 1.9 cm hypermetabolic retroperitoneal soft tissue nodule in the posterior right upper quadrant. Indeterminate. Tiny left groin lymph nodes are unchanged in size since prior PET-CT and 1 of these demonstrates a clear central fatty hilum. Both are FDG avid on today's exam (not seen previously). Given response of cervical disease, these features may reflect reactive etiology and attention on follow-up suggested. Stable fusiform  descending thoracic aortic aneurysm. Follow-up CT in 1 year could be used to reassess.   PLAN: -  Discussed pt labwork today, 01/01/2021; blood counts and chemistries completely normal. -No lab or clinical evidence of Hodgkin's Lymphoma recurrence at this time. No indication for further treatment.  -Pt is over 2.5 years out from last treatment. -Recommend pt f/u with Dermatologist as scheduled for skin cancer monitoring -Will see back in 6 months with labs.   FOLLOW UP: RTC with Dr. Irene Limbo with labs in 6 months    The total time spent in the appt was 20 minutes and more than 50% was on counseling and direct patient cares.  All of the patient's questions were answered with apparent satisfaction. The patient knows to call the clinic with any problems, questions or concerns.    Sullivan Lone MD [place] AAHIVMS [facility] Hematology/Oncology Physician [facility]  (Office):       [phone] (Work cell):  [phone] (Fax):           [phone]  01/01/2021 12:40 PM  I, Reinaldo Raddle, am acting as scribe for Dr. Sullivan Lone, MD.     .I have reviewed the above documentation for accuracy and completeness, and I agree with the above. Brunetta Genera MD

## 2021-01-01 ENCOUNTER — Other Ambulatory Visit: Payer: Self-pay

## 2021-01-01 ENCOUNTER — Inpatient Hospital Stay: Payer: Medicare HMO | Attending: Hematology

## 2021-01-01 ENCOUNTER — Inpatient Hospital Stay: Payer: Medicare HMO | Admitting: Hematology

## 2021-01-01 VITALS — BP 128/84 | HR 80 | Temp 98.3°F | Resp 19 | Wt 206.6 lb

## 2021-01-01 DIAGNOSIS — Z79899 Other long term (current) drug therapy: Secondary | ICD-10-CM | POA: Diagnosis not present

## 2021-01-01 DIAGNOSIS — C8191 Hodgkin lymphoma, unspecified, lymph nodes of head, face, and neck: Secondary | ICD-10-CM

## 2021-01-01 DIAGNOSIS — C8121 Mixed cellularity classical Hodgkin lymphoma, lymph nodes of head, face, and neck: Secondary | ICD-10-CM | POA: Insufficient documentation

## 2021-01-01 LAB — CBC WITH DIFFERENTIAL/PLATELET
Abs Immature Granulocytes: 0.01 10*3/uL (ref 0.00–0.07)
Basophils Absolute: 0.1 10*3/uL (ref 0.0–0.1)
Basophils Relative: 1 %
Eosinophils Absolute: 0.2 10*3/uL (ref 0.0–0.5)
Eosinophils Relative: 3 %
HCT: 38.3 % (ref 36.0–46.0)
Hemoglobin: 12.6 g/dL (ref 12.0–15.0)
Immature Granulocytes: 0 %
Lymphocytes Relative: 26 %
Lymphs Abs: 1.7 10*3/uL (ref 0.7–4.0)
MCH: 28.1 pg (ref 26.0–34.0)
MCHC: 32.9 g/dL (ref 30.0–36.0)
MCV: 85.5 fL (ref 80.0–100.0)
Monocytes Absolute: 0.5 10*3/uL (ref 0.1–1.0)
Monocytes Relative: 8 %
Neutro Abs: 4.2 10*3/uL (ref 1.7–7.7)
Neutrophils Relative %: 62 %
Platelets: 279 10*3/uL (ref 150–400)
RBC: 4.48 MIL/uL (ref 3.87–5.11)
RDW: 14.1 % (ref 11.5–15.5)
WBC: 6.7 10*3/uL (ref 4.0–10.5)
nRBC: 0 % (ref 0.0–0.2)

## 2021-01-01 LAB — CMP (CANCER CENTER ONLY)
ALT: 8 U/L (ref 0–44)
AST: 18 U/L (ref 15–41)
Albumin: 3.7 g/dL (ref 3.5–5.0)
Alkaline Phosphatase: 82 U/L (ref 38–126)
Anion gap: 8 (ref 5–15)
BUN: 9 mg/dL (ref 8–23)
CO2: 23 mmol/L (ref 22–32)
Calcium: 9.6 mg/dL (ref 8.9–10.3)
Chloride: 108 mmol/L (ref 98–111)
Creatinine: 0.79 mg/dL (ref 0.44–1.00)
GFR, Estimated: >60 mL/min (ref >60)
Glucose, Bld: 95 mg/dL (ref 70–99)
Potassium: 4.2 mmol/L (ref 3.5–5.1)
Sodium: 139 mmol/L (ref 135–145)
Total Bilirubin: 0.7 mg/dL (ref 0.3–1.2)
Total Protein: 7.5 g/dL (ref 6.5–8.1)

## 2021-01-01 LAB — SEDIMENTATION RATE: Sed Rate: 30 mm/hr — ABNORMAL HIGH (ref 0–22)

## 2021-01-03 ENCOUNTER — Telehealth: Payer: Self-pay | Admitting: Hematology

## 2021-01-03 NOTE — Telephone Encounter (Signed)
Scheduled follow-up appointment per 7/11 los. Patient is aware. 

## 2021-01-04 ENCOUNTER — Encounter: Payer: Self-pay | Admitting: Hematology

## 2021-01-09 ENCOUNTER — Other Ambulatory Visit: Payer: Self-pay | Admitting: Cardiothoracic Surgery

## 2021-01-09 ENCOUNTER — Ambulatory Visit
Admission: RE | Admit: 2021-01-09 | Discharge: 2021-01-09 | Disposition: A | Discharge status: Home / Self Care | Payer: Medicare HMO | Source: Ambulatory Visit | Attending: Cardiothoracic Surgery | Admitting: Cardiothoracic Surgery

## 2021-01-09 ENCOUNTER — Other Ambulatory Visit: Payer: Self-pay

## 2021-01-09 DIAGNOSIS — J9811 Atelectasis: Secondary | ICD-10-CM | POA: Diagnosis not present

## 2021-01-09 DIAGNOSIS — I712 Thoracic aortic aneurysm, without rupture, unspecified: Secondary | ICD-10-CM

## 2021-01-09 DIAGNOSIS — R911 Solitary pulmonary nodule: Secondary | ICD-10-CM | POA: Diagnosis not present

## 2021-01-09 DIAGNOSIS — I700 Atherosclerosis of aorta: Secondary | ICD-10-CM | POA: Diagnosis not present

## 2021-01-11 ENCOUNTER — Ambulatory Visit: Payer: Medicare HMO | Admitting: Cardiothoracic Surgery

## 2021-01-16 ENCOUNTER — Other Ambulatory Visit: Payer: Self-pay

## 2021-01-16 ENCOUNTER — Encounter: Payer: Self-pay | Admitting: Vascular Surgery

## 2021-01-16 ENCOUNTER — Ambulatory Visit: Payer: Medicare HMO | Admitting: Vascular Surgery

## 2021-01-16 VITALS — BP 141/93 | HR 70 | Temp 98.0°F | Resp 20 | Ht 68.0 in | Wt 206.0 lb

## 2021-01-16 DIAGNOSIS — I716 Thoracoabdominal aortic aneurysm, without rupture, unspecified: Secondary | ICD-10-CM

## 2021-01-16 NOTE — Progress Notes (Signed)
[facility] [place]  ASSESSMENT / PLAN: Jennifer Juarez is a 75 y.o. 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  A statement from the [employer] and Society for Vascular Surgery estimated the annual rupture risk according to AAA diameter to be the following: 4.0 cm to 4.9 cm in diameter - 0.5% to 5%  The patient is almost a candidate for elective repair of the aneurysm to prevent rupture.  I think she would be best treated at a center performing a high volume of endovascular therapy for thoracoabdominal aneurysms.  [facility]  I am happy to follow her locally if that is what she desires.  CHIEF COMPLAINT: Incidental discovery of aneurysm  HISTORY OF PRESENT ILLNESS: Jennifer CACCAMISEis a 75y.o. female referred to clinic for evaluation of aneurysm demonstrated on CT abdomen pelvis with venous phase contrast done for evaluation of reflux.  Patient has no symptoms attributable to her aneurysm.  She has no personal or family history of aneurysm disease.  She does have a history of coronary artery disease in the family.  She is a highly functional person, who lives independently.  He had about a 10-minute conversation discussing the natural history of aneurysms, and the rationale for surveillance and treatment.  Past Medical History:  Diagnosis Date   AAA (abdominal aortic aneurysm) (HCC)    BPPV (benign paroxysmal positional vertigo)    Cancer (HCC)    H/O seasonal allergies    Hearing loss in right ear    Left cervical lymphadenopathy    Macular degeneration    Obesity    Osteopenia    Pre-syncope 11/09/2017   Retinal vein occlusion    Vitamin D deficiency     Past Surgical History:  Procedure Laterality Date   CATARACT EXTRACTION, BILATERAL     CHOLECYSTECTOMY     IR FLUORO GUIDE PORT  INSERTION RIGHT  10/10/2017   IR REMOVAL TUN ACCESS W/ PORT W/O FL MOD SED  02/05/2018   [place] ACC[place] RIGHT  10/10/2017   MASS EXCISION Left 09/22/2017   Procedure: LEFT CERVICAL LYMPH NODE EXCISION;  Surgeon: RIzora Gala MD;  Location[facility]  Service: ENT;  Laterality: Left;   TUBAL LIGATION      Family History  Problem Relation Age of Onset   Cancer Mother        liver   Heart attack Father    Hypertension Father    Diabetes Mellitus I Father    Cancer Sister        pancreatic, breast   Diabetes Mellitus I Sister    Heart disease Brother    Heart disease Brother    Heart disease Brother    Diabetes Mellitus I Sister    Diabetes Mellitus I Sister     Social History   Socioeconomic History   Marital status: Single    Spouse name: Not on file   Number of children: Not on file   Years of education: Not on file   Highest education level: Not on file  Occupational History   Not on file  Tobacco Use   Smoking status: Former    Types: Cigarettes    Quit date: 06/24/1988    Years since quitting: 32.5   Smokeless tobacco: Never  Vaping Use  Vaping Use: Never used  Substance and Sexual Activity   Alcohol use: No   Drug use: No   Sexual activity: Not on file  Other Topics Concern   Not on file  Social History Narrative   Not on file   Social Determinants of Health   Financial Resource Strain: Not on file  Food Insecurity: Not on file  Transportation Needs: Not on file  Physical Activity: Not on file  Stress: Not on file  Social Connections: Not on file  Intimate Partner Violence: Not on file    Allergies  Allergen Reactions   Cephalexin Anaphylaxis    Tongue and throat swelling   Doxycycline Diarrhea and Nausea And Vomiting   Levofloxacin Other (See Comments)    Tachycardia   Prilosec [Omeprazole] Other (See Comments)    Makes pt dizzy   ** Any Family Meds   Singulair [Montelukast]     Rapid Heart rate    Current  Outpatient Medications  Medication Sig Dispense Refill   acetaminophen (TYLENOL) 500 MG tablet Take 500 mg by mouth every 8 (eight) hours as needed for mild pain or moderate pain.      atorvastatin (LIPITOR) 10 MG tablet      cholecalciferol (VITAMIN D3) 25 MCG (1000 UNIT) tablet      Cyanocobalamin (B-12) 1000 MCG CAPS      Multiple Vitamins-Minerals (CENTRUM MULTIGUMMIES) CHEW      pantoprazole (PROTONIX) 40 MG tablet Take 40 mg by mouth 2 (two) times daily.     polyethylene glycol (MIRALAX / GLYCOLAX) packet Take 17 g by mouth daily.     tacrolimus (PROTOPIC) 0.1 % ointment Apply 1 application topically 2 (two) times daily.     triamcinolone (NASACORT) 55 MCG/ACT AERO nasal inhaler Place 2 sprays into the nose daily as needed (allergies).      No current facility-administered medications for this visit.    REVIEW OF SYSTEMS:  '[X]'$  denotes positive finding, '[ ]'$  denotes negative finding Cardiac  Comments:  Chest pain or chest pressure:    Shortness of breath upon exertion:    Short of breath when lying flat:    Irregular heart rhythm:        Vascular    Pain in calf, thigh, or hip brought on by ambulation:    Pain in feet at night that wakes you up from your sleep:     Blood clot in your veins:    Leg swelling:         Pulmonary    Oxygen at home:    Productive cough:     Wheezing:         Neurologic    Sudden weakness in arms or legs:     Sudden numbness in arms or legs:     Sudden onset of difficulty speaking or slurred speech:    Temporary loss of vision in one eye:     Problems with dizziness:         Gastrointestinal    Blood in stool:     Vomited blood:         Genitourinary    Burning when urinating:     Blood in urine:        Psychiatric    Major depression:         Hematologic    Bleeding problems:    Problems with blood clotting too easily:        Skin    Rashes or ulcers:  Constitutional    Fever or chills:      PHYSICAL EXAM Vitals:    01/16/21 1316  BP: (!) 141/93  Pulse: 70  Resp: 20  Temp: 98 F (36.7 C)  SpO2: 97%  Weight: 206 lb (93.4 kg)  Height: '5\' 8"'$  (1.727 m)    Constitutional: well appearing. no distress. Appears well nourished.  Neurologic: CN intact. no focal findings. no sensory loss. Psychiatric:  Mood and affect symmetric and appropriate. Eyes:  No icterus. No conjunctival pallor. Ears, nose, throat:  mucous membranes moist. Midline trachea.  Cardiac: regular rate and rhythm.  Respiratory:  unlabored. Abdominal:  soft, non-tender, non-distended.  Peripheral vascular: 2+ radial pulses. 2+ DP pulses. Extremity: no edema. no cyanosis. no pallor.  Skin: no gangrene. no ulceration.  Lymphatic: no Stemmer's sign. no palpable lymphadenopathy.  PERTINENT LABORATORY AND RADIOLOGIC DATA  Most recent CBC CBC Latest Ref Rng & Units 01/01/2021 06/06/2020 12/06/2019  WBC 4.0 - 10.5 K/uL 6.7 6.3 6.5  Hemoglobin 12.0 - 15.0 g/dL 12.6 12.0 12.4  Hematocrit 36.0 - 46.0 % 38.3 37.7 38.6  Platelets 150 - 400 K/uL 279 296 301     Most recent CMP CMP Latest Ref Rng & Units 01/01/2021 06/06/2020 12/06/2019  Glucose 70 - 99 mg/dL 95 94 100(H)  BUN 8 - 23 mg/dL '9 9 10  '$ Creatinine 0.44 - 1.00 mg/dL 0.79 0.83 0.83  Sodium 135 - 145 mmol/L 139 140 141  Potassium 3.5 - 5.1 mmol/L 4.2 4.3 4.2  Chloride 98 - 111 mmol/L 108 108 106  CO2 22 - 32 mmol/L '23 25 26  '$ Calcium 8.9 - 10.3 mg/dL 9.6 9.6 9.5  Total Protein 6.5 - 8.1 g/dL 7.5 7.4 7.1  Total Bilirubin 0.3 - 1.2 mg/dL 0.7 0.6 0.4  Alkaline Phos 38 - 126 U/L 82 80 81  AST 15 - 41 U/L '18 22 20  '$ ALT 0 - 44 U/L '8 13 12   '$ T abdomen pelvis with venous phase contrast personally reviewed.  Thoracoabdominal aneurysm measuring 48 mm.  This is incompletely evaluated on this study.  CT angiogram of her chest, abdomen, and pelvis will be needed to better study the aneurysm and plan intervention.  Yevonne Aline. Stanford Breed, MD [facility] Phone  Number: [phone] 01/16/2021 1:42 PM

## 2021-01-17 DIAGNOSIS — R42 Dizziness and giddiness: Secondary | ICD-10-CM | POA: Diagnosis not present

## 2021-01-18 ENCOUNTER — Other Ambulatory Visit: Payer: Self-pay

## 2021-01-18 DIAGNOSIS — I712 Thoracic aortic aneurysm, without rupture, unspecified: Secondary | ICD-10-CM

## 2021-01-22 ENCOUNTER — Encounter: Payer: Medicare HMO | Admitting: Thoracic Surgery (Cardiothoracic Vascular Surgery)

## 2021-01-31 ENCOUNTER — Ambulatory Visit (HOSPITAL_COMMUNITY)
Admission: RE | Admit: 2021-01-31 | Discharge: 2021-01-31 | Disposition: A | Discharge status: Home / Self Care | Payer: Medicare HMO | Source: Ambulatory Visit | Attending: Vascular Surgery | Admitting: Vascular Surgery

## 2021-01-31 ENCOUNTER — Other Ambulatory Visit: Payer: Self-pay

## 2021-01-31 DIAGNOSIS — I712 Thoracic aortic aneurysm, without rupture, unspecified: Secondary | ICD-10-CM

## 2021-01-31 DIAGNOSIS — K5730 Diverticulosis of large intestine without perforation or abscess without bleeding: Secondary | ICD-10-CM | POA: Diagnosis not present

## 2021-01-31 DIAGNOSIS — I716 Thoracoabdominal aortic aneurysm, without rupture: Secondary | ICD-10-CM | POA: Diagnosis not present

## 2021-01-31 MED ORDER — IOHEXOL 350 MG/ML SOLN
100.0000 mL | Freq: Once | INTRAVENOUS | Status: AC | PRN
  Administered 2021-01-31: 100 mL via INTRAVENOUS

## 2021-02-15 DIAGNOSIS — Z8679 Personal history of other diseases of the circulatory system: Secondary | ICD-10-CM | POA: Diagnosis not present

## 2021-02-15 DIAGNOSIS — I716 Thoracoabdominal aortic aneurysm, without rupture: Secondary | ICD-10-CM | POA: Diagnosis not present

## 2021-02-15 DIAGNOSIS — I10 Essential (primary) hypertension: Secondary | ICD-10-CM | POA: Diagnosis not present

## 2021-02-22 DIAGNOSIS — I700 Atherosclerosis of aorta: Secondary | ICD-10-CM | POA: Diagnosis not present

## 2021-02-22 DIAGNOSIS — E538 Deficiency of other specified B group vitamins: Secondary | ICD-10-CM | POA: Diagnosis not present

## 2021-02-22 DIAGNOSIS — E7800 Pure hypercholesterolemia, unspecified: Secondary | ICD-10-CM | POA: Diagnosis not present

## 2021-02-22 DIAGNOSIS — I712 Thoracic aortic aneurysm, without rupture: Secondary | ICD-10-CM | POA: Diagnosis not present

## 2021-02-22 DIAGNOSIS — K219 Gastro-esophageal reflux disease without esophagitis: Secondary | ICD-10-CM | POA: Diagnosis not present

## 2021-02-22 DIAGNOSIS — E559 Vitamin D deficiency, unspecified: Secondary | ICD-10-CM | POA: Diagnosis not present

## 2021-02-22 DIAGNOSIS — E669 Obesity, unspecified: Secondary | ICD-10-CM | POA: Diagnosis not present

## 2021-07-04 ENCOUNTER — Inpatient Hospital Stay: Payer: Medicare HMO | Attending: Hematology | Admitting: Hematology

## 2021-07-04 ENCOUNTER — Other Ambulatory Visit: Payer: Self-pay | Admitting: Family Medicine

## 2021-07-04 ENCOUNTER — Inpatient Hospital Stay: Payer: Medicare HMO

## 2021-07-04 ENCOUNTER — Other Ambulatory Visit: Payer: Self-pay

## 2021-07-04 DIAGNOSIS — C8170 Other classical Hodgkin lymphoma, unspecified site: Secondary | ICD-10-CM | POA: Diagnosis not present

## 2021-07-04 DIAGNOSIS — Z1231 Encounter for screening mammogram for malignant neoplasm of breast: Secondary | ICD-10-CM

## 2021-07-04 DIAGNOSIS — C8191 Hodgkin lymphoma, unspecified, lymph nodes of head, face, and neck: Secondary | ICD-10-CM

## 2021-07-04 LAB — CMP (CANCER CENTER ONLY)
ALT: 11 U/L (ref 0–44)
AST: 18 U/L (ref 15–41)
Albumin: 4 g/dL (ref 3.5–5.0)
Alkaline Phosphatase: 75 U/L (ref 38–126)
Anion gap: 5 (ref 5–15)
BUN: 11 mg/dL (ref 8–23)
CO2: 27 mmol/L (ref 22–32)
Calcium: 9.6 mg/dL (ref 8.9–10.3)
Chloride: 106 mmol/L (ref 98–111)
Creatinine: 0.82 mg/dL (ref 0.44–1.00)
GFR, Estimated: >60 mL/min (ref >60)
Glucose, Bld: 90 mg/dL (ref 70–99)
Potassium: 4 mmol/L (ref 3.5–5.1)
Sodium: 138 mmol/L (ref 135–145)
Total Bilirubin: 0.5 mg/dL (ref 0.3–1.2)
Total Protein: 7.3 g/dL (ref 6.5–8.1)

## 2021-07-04 LAB — CBC WITH DIFFERENTIAL (CANCER CENTER ONLY)
Abs Immature Granulocytes: 0.01 10*3/uL (ref 0.00–0.07)
Basophils Absolute: 0.1 10*3/uL (ref 0.0–0.1)
Basophils Relative: 1 %
Eosinophils Absolute: 0.2 10*3/uL (ref 0.0–0.5)
Eosinophils Relative: 4 %
HCT: 37.2 % (ref 36.0–46.0)
Hemoglobin: 12.6 g/dL (ref 12.0–15.0)
Immature Granulocytes: 0 %
Lymphocytes Relative: 29 %
Lymphs Abs: 1.7 10*3/uL (ref 0.7–4.0)
MCH: 29 pg (ref 26.0–34.0)
MCHC: 33.9 g/dL (ref 30.0–36.0)
MCV: 85.5 fL (ref 80.0–100.0)
Monocytes Absolute: 0.5 10*3/uL (ref 0.1–1.0)
Monocytes Relative: 9 %
Neutro Abs: 3.4 10*3/uL (ref 1.7–7.7)
Neutrophils Relative %: 57 %
Platelet Count: 254 10*3/uL (ref 150–400)
RBC: 4.35 MIL/uL (ref 3.87–5.11)
RDW: 13.6 % (ref 11.5–15.5)
WBC Count: 5.9 10*3/uL (ref 4.0–10.5)
nRBC: 0 % (ref 0.0–0.2)

## 2021-07-04 LAB — SEDIMENTATION RATE: Sed Rate: 20 mm/hr (ref 0–22)

## 2021-07-10 ENCOUNTER — Encounter: Payer: Self-pay | Admitting: Hematology

## 2021-07-10 NOTE — Progress Notes (Signed)
HEMATOLOGY/ONCOLOGY CLINIC NOTE  Date of Service: .07/04/2021   Patient Care Team: Kathyrn Lass, MD as PCP - General (Family Medicine)  CHIEF COMPLAINTS/PURPOSE OF CONSULTATION:  Follow-up for continued evaluation and management of Hodgkin's lymphoma  HISTORY OF PRESENTING ILLNESS:   Please see previous notes for details on initial presentation  Interval History:  [name] is here for her scheduled follow-up for classical Hodgkin's lymphoma.  Her last clinic visit with [place] was about 6 months ago. She notes that she has been evaluated by vascular surgery for her aortic aneurysm.  She reports that she was referred to [facility] and was seen by Dr. Sammuel Hines for her thoracoabdominal aortic aneurysm.  She notes that after a detailed discussion and thought she had decided not to pursue surgical management of her aortic aneurysm. She continues to follow with her primary care physician for continued medical management.  Patient notes no new constitutional symptoms.  No fevers no chills no night sweats.  No unexpected weight loss. No new lumps or bumps. No acute shortness of breath or chest pain. No abdominal pain or distention. She notes that she is having regular bowel habits.  Labs done today were reviewed in detail with her.   MEDICAL HISTORY:  Past Medical History:  Diagnosis Date   AAA (abdominal aortic aneurysm) (HCC)    BPPV (benign paroxysmal positional vertigo)    Cancer (HCC)    H/O seasonal allergies    Hearing loss in right ear    Left cervical lymphadenopathy    Macular degeneration    Obesity    Osteopenia    Pre-syncope 11/09/2017   Retinal vein occlusion    Vitamin D deficiency     SURGICAL HISTORY: Past Surgical History:  Procedure Laterality Date   CATARACT EXTRACTION, BILATERAL     CHOLECYSTECTOMY     IR FLUORO GUIDE PORT INSERTION RIGHT  10/10/2017   IR REMOVAL TUN ACCESS W/ PORT W/O FL MOD SED  02/05/2018   IR [place] GUIDE VASC ACCESS  RIGHT  10/10/2017   MASS EXCISION Left 09/22/2017   Procedure: LEFT CERVICAL LYMPH NODE EXCISION;  Surgeon: Izora Gala, MD;  Location: [facility];  Service: ENT;  Laterality: Left;   TUBAL LIGATION      SOCIAL HISTORY: Social History   Socioeconomic History   Marital status: Single    Spouse name: Not on file   Number of children: Not on file   Years of education: Not on file   Highest education level: Not on file  Occupational History   Not on file  Tobacco Use   Smoking status: Former    Types: Cigarettes    Quit date: 06/24/1988    Years since quitting: 33.0   Smokeless tobacco: Never  Vaping Use   Vaping Use: Never used  Substance and Sexual Activity   Alcohol use: No   Drug use: No   Sexual activity: Not on file  Other Topics Concern   Not on file  Social History Narrative   Not on file   Social Determinants of Health   Financial Resource Strain: Not on file  Food Insecurity: Not on file  Transportation Needs: Not on file  Physical Activity: Not on file  Stress: Not on file  Social Connections: Not on file  Intimate Partner Violence: Not on file    FAMILY HISTORY: Family History  Problem Relation Age of Onset   Cancer Mother  liver   Heart attack Father    Hypertension Father    Diabetes Mellitus I Father    Cancer Sister        pancreatic, breast   Diabetes Mellitus I Sister    Heart disease Brother    Heart disease Brother    Heart disease Brother    Diabetes Mellitus I Sister    Diabetes Mellitus I Sister     ALLERGIES:  is allergic to cephalexin, doxycycline, levofloxacin, prilosec [omeprazole], and singulair [montelukast].  MEDICATIONS:  Current Outpatient Medications  Medication Sig Dispense Refill   acetaminophen (TYLENOL) 500 MG tablet Take 500 mg by mouth every 8 (eight) hours as needed for mild pain or moderate pain.      atorvastatin (LIPITOR) 10 MG tablet      cholecalciferol (VITAMIN D3) 25 MCG (1000 UNIT)  tablet      Multiple Vitamins-Minerals (CENTRUM MULTIGUMMIES) CHEW      pantoprazole (PROTONIX) 40 MG tablet Take 40 mg by mouth 2 (two) times daily.     polyethylene glycol (MIRALAX / GLYCOLAX) packet Take 17 g by mouth daily.     triamcinolone (NASACORT) 55 MCG/ACT AERO nasal inhaler Place 2 sprays into the nose daily as needed (allergies).      Cyanocobalamin (B-12) 1000 MCG CAPS  (Patient not taking: Reported on 07/04/2021)     tacrolimus (PROTOPIC) 0.1 % ointment Apply 1 application topically 2 (two) times daily.     No current facility-administered medications for this visit.    REVIEW OF SYSTEMS:   .10 Point review of Systems was done is negative except as noted above.   PHYSICAL EXAMINATION: ECOG PERFORMANCE STATUS: 1 Vital signs stable . GENERAL:alert, in no acute distress and comfortable SKIN: no acute rashes, no significant lesions EYES: conjunctiva are pink and non-injected, sclera anicteric OROPHARYNX: MMM, no exudates, no oropharyngeal erythema or ulceration NECK: supple, no JVD LYMPH:  no palpable lymphadenopathy in the cervical, axillary or inguinal regions LUNGS: clear to auscultation b/l with normal respiratory effort HEART: regular rate & rhythm ABDOMEN:  normoactive bowel sounds , non tender, not distended. Extremity: no pedal edema PSYCH: alert & oriented x 3 with fluent speech NEURO: no focal motor/sensory deficits    LABORATORY DATA:  I have reviewed the data as listed  . CBC Latest Ref Rng & Units 07/04/2021 01/01/2021 06/06/2020  WBC 4.0 - 10.5 K/uL 5.9 6.7 6.3  Hemoglobin 12.0 - 15.0 g/dL 12.6 12.6 12.0  Hematocrit 36.0 - 46.0 % 37.2 38.3 37.7  Platelets 150 - 400 K/uL 254 279 296   . CMP Latest Ref Rng & Units 07/04/2021 01/01/2021 06/06/2020  Glucose 70 - 99 mg/dL 90 95 94  BUN 8 - 23 mg/dL 11 9 9   Creatinine 0.44 - 1.00 mg/dL 0.82 0.79 0.83  Sodium 135 - 145 mmol/L 138 139 140  Potassium 3.5 - 5.1 mmol/L 4.0 4.2 4.3  Chloride 98 - 111 mmol/L  106 108 108  CO2 22 - 32 mmol/L 27 23 25   Calcium 8.9 - 10.3 mg/dL 9.6 9.6 9.6  Total Protein 6.5 - 8.1 g/dL 7.3 7.5 7.4  Total Bilirubin 0.3 - 1.2 mg/dL 0.5 0.7 0.6  Alkaline Phos 38 - 126 U/L 75 82 80  AST 15 - 41 U/L 18 18 22   ALT 0 - 44 U/L 11 8 13    Sed rate 20 (WNL)    08/12/17 Lymph Node Needle/core Biopsy:   08/12/17 Tissue Flow Cytometry:    09/22/17 Tissue Flow Cytometry  09/22/17 Lymph Node Pathology:   RADIOGRAPHIC STUDIES: I have personally reviewed the radiological images as listed and agreed with the findings in the report. No results found.  ASSESSMENT & PLAN:   76 y.o. female with  1. Stage IA Hodgkin's Lymphoma (mixed cellularity) No constitutional symptoms Sed rate elevated to 30 PET findings most consistent with Stage I disease; discussed that a small hypermetabolic nodule found in her liver has remain unchanged for 4 years and is not considered to be involved with her Hodgkin's lymphoma.  -Left cervical lymph nodes are involved without further involvement.  01/15/18 PET/CT  revealed Interval complete response of left cervical hypermetabolic lymphadenopathy, now [place] 2. Stable 1.9 cm hypermetabolic retroperitoneal soft tissue nodule in the posterior right upper quadrant. Indeterminate. Tiny left groin lymph nodes are unchanged in size since prior PET-CT and 1 of these demonstrates a clear central fatty hilum. Both are FDG avid on today's exam (not seen previously). Given response of cervical disease, these features may reflect reactive etiology and attention on follow-up suggested. Stable fusiform descending thoracic aortic aneurysm. Follow-up CT in 1 year could be used to reassess.   PLAN: -Patient's labs done today were discussed with her in detail.  CBC and CMP unremarkable.  Sedimentation rate within normal limits at 20 -Patient has no new clinical signs or symptoms suggestive of Hodgkin's lymphoma recurrence/progression. -Patient is now more than  3 years out from her chemotherapy completion. -We shall see her back in 6 months with repeat labs and follow-up H&P and if stable might consider going to yearly follow-ups. -No additional work-up or treatment of Hodgkin's lymphoma at this time.  FOLLOW UP: RTC with Dr. Irene Limbo with labs in 6 months   All of the patient's questions were answered with apparent satisfaction. The patient knows to call the clinic with any problems, questions or concerns.    Sullivan Lone MD [place] AAHIVMS [facility] Hematology/Oncology Physician [facility]

## 2021-07-13 ENCOUNTER — Ambulatory Visit
Admission: RE | Admit: 2021-07-13 | Discharge: 2021-07-13 | Disposition: A | Discharge status: Home / Self Care | Payer: Medicare HMO | Source: Ambulatory Visit | Attending: Family Medicine | Admitting: Family Medicine

## 2021-07-13 DIAGNOSIS — Z1231 Encounter for screening mammogram for malignant neoplasm of breast: Secondary | ICD-10-CM

## 2021-07-16 ENCOUNTER — Other Ambulatory Visit: Payer: Self-pay | Admitting: Family Medicine

## 2021-07-16 DIAGNOSIS — R928 Other abnormal and inconclusive findings on diagnostic imaging of breast: Secondary | ICD-10-CM

## 2021-07-17 ENCOUNTER — Encounter: Payer: Self-pay | Admitting: Hematology

## 2021-07-17 ENCOUNTER — Ambulatory Visit: Payer: Medicare HMO

## 2021-07-17 ENCOUNTER — Ambulatory Visit
Admission: RE | Admit: 2021-07-17 | Discharge: 2021-07-17 | Disposition: A | Discharge status: Home / Self Care | Payer: Medicare HMO | Source: Ambulatory Visit | Attending: Family Medicine | Admitting: Family Medicine

## 2021-07-17 DIAGNOSIS — R928 Other abnormal and inconclusive findings on diagnostic imaging of breast: Secondary | ICD-10-CM | POA: Diagnosis not present

## 2021-07-17 DIAGNOSIS — R922 Inconclusive mammogram: Secondary | ICD-10-CM | POA: Diagnosis not present

## 2021-07-19 ENCOUNTER — Encounter (INDEPENDENT_AMBULATORY_CARE_PROVIDER_SITE_OTHER): Payer: Self-pay | Admitting: Ophthalmology

## 2021-07-19 ENCOUNTER — Ambulatory Visit (INDEPENDENT_AMBULATORY_CARE_PROVIDER_SITE_OTHER): Payer: Medicare HMO | Admitting: Ophthalmology

## 2021-07-19 ENCOUNTER — Other Ambulatory Visit: Payer: Self-pay

## 2021-07-19 DIAGNOSIS — Z961 Presence of intraocular lens: Secondary | ICD-10-CM | POA: Diagnosis not present

## 2021-07-19 DIAGNOSIS — H33322 Round hole, left eye: Secondary | ICD-10-CM | POA: Diagnosis not present

## 2021-07-19 DIAGNOSIS — H348312 Tributary (branch) retinal vein occlusion, right eye, stable: Secondary | ICD-10-CM | POA: Diagnosis not present

## 2021-07-19 NOTE — Assessment & Plan Note (Signed)
No new progression of NVE, no new CME

## 2021-07-19 NOTE — Progress Notes (Signed)
07/19/2021     CHIEF COMPLAINT Patient presents for  Chief Complaint  Patient presents with   Retina Follow Up      HISTORY OF PRESENT ILLNESS: Jennifer Juarez is a 76 y.o. female who presents to the clinic today for:   HPI     Retina Follow Up           Diagnosis: CRVO/BRVO   Laterality: right eye   Onset: 1 year ago   Severity: mild   Duration: 1 year   Course: stable         Comments   1 yr fu OCT FP.       Last edited by Hurman Horn, MD on 07/19/2021 11:19 AM.      Referring physician: Kathyrn Lass, MD [place],  [place] [id number]  HISTORICAL INFORMATION:   Selected notes from the MEDICAL RECORD NUMBER       CURRENT MEDICATIONS: No current outpatient medications on file. (Ophthalmic Drugs)   No current facility-administered medications for this visit. (Ophthalmic Drugs)   Current Outpatient Medications (Other)  Medication Sig   acetaminophen (TYLENOL) 500 MG tablet Take 500 mg by mouth every 8 (eight) hours as needed for mild pain or moderate pain.    atorvastatin (LIPITOR) 10 MG tablet    cholecalciferol (VITAMIN D3) 25 MCG (1000 UNIT) tablet    Cyanocobalamin (B-12) 1000 MCG CAPS  (Patient not taking: Reported on 07/04/2021)   Multiple Vitamins-Minerals (CENTRUM MULTIGUMMIES) CHEW    pantoprazole (PROTONIX) 40 MG tablet Take 40 mg by mouth 2 (two) times daily.   polyethylene glycol (MIRALAX / GLYCOLAX) packet Take 17 g by mouth daily.   tacrolimus (PROTOPIC) 0.1 % ointment Apply 1 application topically 2 (two) times daily.   triamcinolone (NASACORT) 55 MCG/ACT AERO nasal inhaler Place 2 sprays into the nose daily as needed (allergies).    No current facility-administered medications for this visit. (Other)      REVIEW OF SYSTEMS:    ALLERGIES Allergies  Allergen Reactions   Cephalexin Anaphylaxis    Tongue and throat swelling   Doxycycline Diarrhea and Nausea And Vomiting   Levofloxacin Other (See Comments)     Tachycardia   Prilosec [Omeprazole] Other (See Comments)    Makes pt dizzy   ** Any Family Meds   Singulair [Montelukast]     Rapid Heart rate    PAST MEDICAL HISTORY Past Medical History:  Diagnosis Date   AAA (abdominal aortic aneurysm)    BPPV (benign paroxysmal positional vertigo)    Cancer (HCC)    H/O seasonal allergies    Hearing loss in right ear    Left cervical lymphadenopathy    Macular degeneration    Obesity    Osteopenia    Pre-syncope 11/09/2017   Retinal vein occlusion    Vitamin D deficiency    Past Surgical History:  Procedure Laterality Date   CATARACT EXTRACTION, BILATERAL     CHOLECYSTECTOMY     IR FLUORO GUIDE PORT INSERTION RIGHT  10/10/2017   IR REMOVAL TUN ACCESS W/ PORT W/O FL MOD SED  02/05/2018   IR US GUIDE VASC ACCESS RIGHT  10/10/2017   MASS EXCISION Left 09/22/2017   Procedure: LEFT CERVICAL LYMPH NODE EXCISION;  Surgeon: Izora Gala, MD;  Location: [facility];  Service: ENT;  Laterality: Left;   TUBAL LIGATION      FAMILY HISTORY Family History  Problem Relation Age of Onset   Cancer Mother  liver   Heart attack Father    Hypertension Father    Diabetes Mellitus I Father    Cancer Sister        pancreatic, breast   Diabetes Mellitus I Sister    Heart disease Brother    Heart disease Brother    Heart disease Brother    Diabetes Mellitus I Sister    Diabetes Mellitus I Sister     SOCIAL HISTORY Social History   Tobacco Use   Smoking status: Former    Types: Cigarettes    Quit date: 06/24/1988    Years since quitting: 33.0   Smokeless tobacco: Never  Vaping Use   Vaping Use: Never used  Substance Use Topics   Alcohol use: No   Drug use: No         OPHTHALMIC EXAM:  Base Eye Exam     Visual Acuity (ETDRS)       Right Left   Dist [place] 20/30 -2 20/20   Dist ph [place] NI          Tonometry (Tonopen, 10:49 AM)       Right Left   Pressure 8 11         Pupils       Pupils Dark Light APD    Right PERRL 3 2 None   Left PERRL 3 2 None         Visual Fields (Counting fingers)       Left Right    Full Full         Extraocular Movement       Right Left    Full Full         Neuro/Psych     Oriented x3: Yes   Mood/Affect: Normal         Dilation     Both eyes: 1.0% Mydriacyl, 2.5% Phenylephrine @ 10:49 AM           Slit Lamp and Fundus Exam     External Exam       Right Left   External Normal Normal         Slit Lamp Exam       Right Left   Lids/Lashes Normal Normal   Conjunctiva/Sclera White and quiet White and quiet   Cornea Clear Clear   Anterior Chamber Deep and quiet Deep and quiet   Iris Round and reactive Round and reactive   Lens Centered posterior chamber intraocular lens, PC haze +1, 1+ Posterior capsular opacification Centered posterior chamber intraocular lens, 1+ Posterior capsular opacification   Anterior Vitreous Normal Normal         Fundus Exam       Right Left   Posterior Vitreous Posterior vitreous detachment Posterior vitreous detachment   Disc Normal Peripapillary atrophy   C/D Ratio 0.1 0.1   Macula Normal, no CME, focal laser scars inferotemporal Normal   Vessels Old inferotemporal branch retinal vein occlusion, good sector photocoagulation in this region, no active disease Normal   Periphery Good sector PRP inferotemporal., No new edema nor NVE Retinal break, operculated at 3 meridian near the equator good retinopexy no new breaks,             IMAGING AND PROCEDURES  Imaging and Procedures for 07/19/21  OCT, Retina - OU - Both Eyes       Right Eye Central Foveal Thickness: 231. Findings include normal foveal contour.   Left Eye Central Foveal Thickness: 276. Progression has been stable.  Findings include normal foveal contour.      Color Fundus Photography Optos - OU - Both Eyes       Right Eye Progression has been stable. Disc findings include normal observations. Macula : normal  observations.   Left Eye Progression has been stable. Disc findings include normal observations. Macula : normal observations. Vessels : normal observations.   Notes Old inferotemporal branch retinal vein occlusion, no neovascularization no CME OD  OS, retinal break temporally  Well surrounded by laser retinopexy.             ASSESSMENT/PLAN:  Stable branch retinal vein occlusion of right eye No new progression of NVE, no new CME  Retinal hole, left Good retinopexy OS no new breaks  Pseudophakia, both eyes Stable OU     ICD-10-CM   1. Retinal hole, left  H33.322 OCT, Retina - OU - Both Eyes    Color Fundus Photography Optos - OU - Both Eyes    2. Stable branch retinal vein occlusion of right eye  H34.8312 OCT, Retina - OU - Both Eyes    3. Pseudophakia, both eyes  Z96.1       1.  Stable OU, will continue to observe  2.  3.  Ophthalmic Meds Ordered this visit:  No orders of the defined types were placed in this encounter.      Return in about 1 year (around 07/19/2022) for DILATE OU, COLOR FP, OCT.  There are no Patient Instructions on file for this visit.   Explained the diagnoses, plan, and follow up with the patient and they expressed understanding.  Patient expressed understanding of the importance of proper follow up care.   Clent Demark Mitzi Lilja M.D. Diseases & Surgery of the Retina and Vitreous [facility] [place] 07/19/21     Abbreviations: M myopia (nearsighted); A astigmatism; H hyperopia (farsighted); P presbyopia; Mrx spectacle prescription;  CTL contact lenses; OD right eye; OS left eye; OU both eyes  XT exotropia; ET esotropia; PEK punctate epithelial keratitis; PEE punctate epithelial erosions; DES dry eye syndrome; MGD meibomian gland dysfunction; ATs artificial tears; PFAT's preservative free artificial tears; Longfellow nuclear sclerotic cataract; PSC posterior subcapsular cataract; ERM epi-retinal membrane; PVD posterior vitreous  detachment; RD retinal detachment; DM diabetes mellitus; DR diabetic retinopathy; NPDR non-proliferative diabetic retinopathy; PDR proliferative diabetic retinopathy; CSME clinically significant macular edema; DME diabetic macular edema; dbh dot blot hemorrhages; CWS cotton wool spot; POAG primary open angle glaucoma; C/D cup-to-disc ratio; HVF humphrey visual field; GVF goldmann visual field; OCT optical coherence tomography; IOP intraocular pressure; BRVO Branch retinal vein occlusion; CRVO central retinal vein occlusion; CRAO central retinal artery occlusion; BRAO branch retinal artery occlusion; RT retinal tear; SB scleral buckle; PPV pars plana vitrectomy; VH Vitreous hemorrhage; PRP panretinal laser photocoagulation; IVK intravitreal kenalog; VMT vitreomacular traction; MH Macular hole;  NVD neovascularization of the disc; NVE neovascularization elsewhere; AREDS age related eye disease study; ARMD age related macular degeneration; POAG primary open angle glaucoma; EBMD epithelial/anterior basement membrane dystrophy; ACIOL anterior chamber intraocular lens; IOL intraocular lens; PCIOL posterior chamber intraocular lens; Phaco/IOL phacoemulsification with intraocular lens placement; [place] photorefractive keratectomy; LASIK laser assisted in situ keratomileusis; HTN hypertension; DM diabetes mellitus; COPD chronic obstructive pulmonary disease

## 2021-07-19 NOTE — Assessment & Plan Note (Signed)
Good retinopexy OS no new breaks

## 2021-07-19 NOTE — Assessment & Plan Note (Signed)
Stable OU 

## 2021-08-20 DIAGNOSIS — L814 Other melanin hyperpigmentation: Secondary | ICD-10-CM | POA: Diagnosis not present

## 2021-08-20 DIAGNOSIS — L821 Other seborrheic keratosis: Secondary | ICD-10-CM | POA: Diagnosis not present

## 2021-08-20 DIAGNOSIS — L2089 Other atopic dermatitis: Secondary | ICD-10-CM | POA: Diagnosis not present

## 2021-08-20 DIAGNOSIS — Z85828 Personal history of other malignant neoplasm of skin: Secondary | ICD-10-CM | POA: Diagnosis not present

## 2021-08-20 DIAGNOSIS — D1801 Hemangioma of skin and subcutaneous tissue: Secondary | ICD-10-CM | POA: Diagnosis not present

## 2021-08-20 DIAGNOSIS — X32XXXS Exposure to sunlight, sequela: Secondary | ICD-10-CM | POA: Diagnosis not present

## 2021-08-20 DIAGNOSIS — H61002 Unspecified perichondritis of left external ear: Secondary | ICD-10-CM | POA: Diagnosis not present

## 2021-08-21 ENCOUNTER — Encounter (HOSPITAL_COMMUNITY): Payer: Self-pay | Admitting: Radiology

## 2021-08-21 DIAGNOSIS — L600 Ingrowing nail: Secondary | ICD-10-CM | POA: Diagnosis not present

## 2021-11-20 DIAGNOSIS — C8171 Other classical Hodgkin lymphoma, lymph nodes of head, face, and neck: Secondary | ICD-10-CM | POA: Diagnosis not present

## 2021-11-20 DIAGNOSIS — Z1389 Encounter for screening for other disorder: Secondary | ICD-10-CM | POA: Diagnosis not present

## 2021-11-20 DIAGNOSIS — I712 Thoracic aortic aneurysm, without rupture, unspecified: Secondary | ICD-10-CM | POA: Diagnosis not present

## 2021-11-20 DIAGNOSIS — I700 Atherosclerosis of aorta: Secondary | ICD-10-CM | POA: Diagnosis not present

## 2021-11-20 DIAGNOSIS — Z0000 Encounter for general adult medical examination without abnormal findings: Secondary | ICD-10-CM | POA: Diagnosis not present

## 2021-11-20 DIAGNOSIS — K219 Gastro-esophageal reflux disease without esophagitis: Secondary | ICD-10-CM | POA: Diagnosis not present

## 2021-12-27 ENCOUNTER — Telehealth: Payer: Self-pay | Admitting: Hematology

## 2021-12-27 NOTE — Telephone Encounter (Signed)
Rescheduled upcoming appointment due to provider's PAL. Patient is aware of changes. ?

## 2022-01-03 ENCOUNTER — Other Ambulatory Visit: Payer: Medicare HMO

## 2022-01-03 ENCOUNTER — Ambulatory Visit: Payer: Medicare HMO | Admitting: Hematology

## 2022-02-15 ENCOUNTER — Other Ambulatory Visit: Payer: Self-pay

## 2022-02-15 DIAGNOSIS — C8191 Hodgkin lymphoma, unspecified, lymph nodes of head, face, and neck: Secondary | ICD-10-CM

## 2022-02-18 ENCOUNTER — Inpatient Hospital Stay: Payer: Medicare HMO | Attending: Hematology

## 2022-02-18 ENCOUNTER — Inpatient Hospital Stay (HOSPITAL_BASED_OUTPATIENT_CLINIC_OR_DEPARTMENT_OTHER): Payer: Medicare HMO | Admitting: Hematology

## 2022-02-18 ENCOUNTER — Other Ambulatory Visit: Payer: Self-pay

## 2022-02-18 VITALS — BP 142/91 | HR 72 | Temp 97.4°F | Resp 16 | Wt 209.6 lb

## 2022-02-18 DIAGNOSIS — C8191 Hodgkin lymphoma, unspecified, lymph nodes of head, face, and neck: Secondary | ICD-10-CM | POA: Diagnosis not present

## 2022-02-18 DIAGNOSIS — C8190 Hodgkin lymphoma, unspecified, unspecified site: Secondary | ICD-10-CM | POA: Insufficient documentation

## 2022-02-18 LAB — CBC WITH DIFFERENTIAL (CANCER CENTER ONLY)
Abs Immature Granulocytes: 0.01 10*3/uL (ref 0.00–0.07)
Basophils Absolute: 0.1 10*3/uL (ref 0.0–0.1)
Basophils Relative: 1 %
Eosinophils Absolute: 0.2 10*3/uL (ref 0.0–0.5)
Eosinophils Relative: 3 %
HCT: 38.7 % (ref 36.0–46.0)
Hemoglobin: 12.9 g/dL (ref 12.0–15.0)
Immature Granulocytes: 0 %
Lymphocytes Relative: 28 %
Lymphs Abs: 1.8 10*3/uL (ref 0.7–4.0)
MCH: 28 pg (ref 26.0–34.0)
MCHC: 33.3 g/dL (ref 30.0–36.0)
MCV: 83.9 fL (ref 80.0–100.0)
Monocytes Absolute: 0.5 10*3/uL (ref 0.1–1.0)
Monocytes Relative: 8 %
Neutro Abs: 4 10*3/uL (ref 1.7–7.7)
Neutrophils Relative %: 60 %
Platelet Count: 251 10*3/uL (ref 150–400)
RBC: 4.61 MIL/uL (ref 3.87–5.11)
RDW: 13.4 % (ref 11.5–15.5)
WBC Count: 6.6 10*3/uL (ref 4.0–10.5)
nRBC: 0 % (ref 0.0–0.2)

## 2022-02-18 LAB — CMP (CANCER CENTER ONLY)
ALT: 8 U/L (ref 0–44)
AST: 17 U/L (ref 15–41)
Albumin: 4.1 g/dL (ref 3.5–5.0)
Alkaline Phosphatase: 82 U/L (ref 38–126)
Anion gap: 5 (ref 5–15)
BUN: 10 mg/dL (ref 8–23)
CO2: 25 mmol/L (ref 22–32)
Calcium: 9.7 mg/dL (ref 8.9–10.3)
Chloride: 107 mmol/L (ref 98–111)
Creatinine: 0.66 mg/dL (ref 0.44–1.00)
GFR, Estimated: >60 mL/min (ref >60)
Glucose, Bld: 98 mg/dL (ref 70–99)
Potassium: 3.9 mmol/L (ref 3.5–5.1)
Sodium: 137 mmol/L (ref 135–145)
Total Bilirubin: 0.6 mg/dL (ref 0.3–1.2)
Total Protein: 7.2 g/dL (ref 6.5–8.1)

## 2022-02-18 LAB — SEDIMENTATION RATE: Sed Rate: 15 mm/hr (ref 0–22)

## 2022-02-19 ENCOUNTER — Telehealth: Payer: Self-pay | Admitting: Hematology

## 2022-02-19 NOTE — Telephone Encounter (Signed)
Scheduled follow-up appointment per 8/28 los. Patient is aware.

## 2022-02-24 ENCOUNTER — Encounter: Payer: Self-pay | Admitting: Hematology

## 2022-02-24 NOTE — Progress Notes (Signed)
HEMATOLOGY/ONCOLOGY CLINIC NOTE  Date of Service: .02/18/2022   Patient Care Team: Kathyrn Lass, MD as PCP - General (Family Medicine)  CHIEF COMPLAINTS/PURPOSE OF CONSULTATION:  Follow-up for continued surveillance of Hodgkin's lymphoma  HISTORY OF PRESENTING ILLNESS:   Please see previous notes for details on initial presentation  Interval History:   [name] is here for continued evaluation and management of her Hodgkin's lymphoma.  She notes no acute new symptoms since her last clinic visit.  Notes that she has been having to take care of her family members.  No acute new symptoms.  No fevers no chills no night sweats no unexpected weight loss. No new lumps or bumps. No unexpected new fatigue. Labs done today were reviewed with her in detail  MEDICAL HISTORY:  Past Medical History:  Diagnosis Date   AAA (abdominal aortic aneurysm)    BPPV (benign paroxysmal positional vertigo)    Cancer (HCC)    H/O seasonal allergies    Hearing loss in right ear    Left cervical lymphadenopathy    Macular degeneration    Obesity    Osteopenia    Pre-syncope 11/09/2017   Retinal vein occlusion    Vitamin D deficiency     SURGICAL HISTORY: Past Surgical History:  Procedure Laterality Date   CATARACT EXTRACTION, BILATERAL     CHOLECYSTECTOMY     IR FLUORO GUIDE PORT INSERTION RIGHT  10/10/2017   IR REMOVAL TUN ACCESS W/ PORT W/O FL MOD SED  02/05/2018   IR US GUIDE VASC ACCESS RIGHT  10/10/2017   MASS EXCISION Left 09/22/2017   Procedure: LEFT CERVICAL LYMPH NODE EXCISION;  Surgeon: Izora Gala, MD;  Location: [facility];  Service: ENT;  Laterality: Left;   TUBAL LIGATION      SOCIAL HISTORY: Social History   Socioeconomic History   Marital status: Single    Spouse name: Not on file   Number of children: Not on file   Years of education: Not on file   Highest education level: Not on file  Occupational History   Not on file  Tobacco Use    Smoking status: Former    Types: Cigarettes    Quit date: 06/24/1988    Years since quitting: 33.6   Smokeless tobacco: Never  Vaping Use   Vaping Use: Never used  Substance and Sexual Activity   Alcohol use: No   Drug use: No   Sexual activity: Not on file  Other Topics Concern   Not on file  Social History Narrative   Not on file   Social Determinants of Health   Financial Resource Strain: Not on file  Food Insecurity: Not on file  Transportation Needs: Not on file  Physical Activity: Not on file  Stress: Not on file  Social Connections: Not on file  Intimate Partner Violence: Not on file    FAMILY HISTORY: Family History  Problem Relation Age of Onset   Cancer Mother        liver   Heart attack Father    Hypertension Father    Diabetes Mellitus I Father    Cancer Sister        pancreatic, breast   Diabetes Mellitus I Sister    Heart disease Brother    Heart disease Brother    Heart disease Brother    Diabetes Mellitus I Sister    Diabetes Mellitus I Sister     ALLERGIES:  is allergic to cephalexin, doxycycline,  levofloxacin, prilosec [omeprazole], and singulair [montelukast].  MEDICATIONS:  Current Outpatient Medications  Medication Sig Dispense Refill   acetaminophen (TYLENOL) 500 MG tablet Take 500 mg by mouth every 8 (eight) hours as needed for mild pain or moderate pain.      atorvastatin (LIPITOR) 10 MG tablet      cholecalciferol (VITAMIN D3) 25 MCG (1000 UNIT) tablet      Cyanocobalamin (B-12) 1000 MCG CAPS  (Patient not taking: Reported on 07/04/2021)     Multiple Vitamins-Minerals (CENTRUM MULTIGUMMIES) CHEW      pantoprazole (PROTONIX) 40 MG tablet Take 40 mg by mouth 2 (two) times daily.     polyethylene glycol (MIRALAX / GLYCOLAX) packet Take 17 g by mouth daily.     tacrolimus (PROTOPIC) 0.1 % ointment Apply 1 application topically 2 (two) times daily.     triamcinolone (NASACORT) 55 MCG/ACT AERO nasal inhaler Place 2 sprays into the nose daily  as needed (allergies).      No current facility-administered medications for this visit.    REVIEW OF SYSTEMS:   10 Point review of Systems was done is negative except as noted above.   PHYSICAL EXAMINATION: ECOG PERFORMANCE STATUS: 1 .BP (!) 142/91 (BP Location: Left Arm, Patient Position: Sitting) Comment: Nurse was notify  Pulse 72   Temp (!) 97.4 F (36.3 C)   Resp 16   Wt 209 lb 9.6 oz (95.1 kg)   SpO2 95%   BMI 31.87 kg/m  NAD GENERAL:alert, in no acute distress and comfortable SKIN: no acute rashes, no significant lesions EYES: conjunctiva are pink and non-injected, sclera anicteric OROPHARYNX: MMM, no exudates, no oropharyngeal erythema or ulceration NECK: supple, no JVD LYMPH:  no palpable lymphadenopathy in the cervical, axillary or inguinal regions LUNGS: clear to auscultation b/l with normal respiratory effort HEART: regular rate & rhythm ABDOMEN:  normoactive bowel sounds , non tender, not distended. Extremity: no pedal edema PSYCH: alert & oriented x 3 with fluent speech NEURO: no focal motor/sensory deficits   LABORATORY DATA:  I have reviewed the data as listed  .    Latest Ref Rng & Units 02/18/2022   12:35 PM 07/04/2021   11:22 AM 01/01/2021   10:53 AM  CBC  WBC 4.0 - 10.5 K/uL 6.6  5.9  6.7   Hemoglobin 12.0 - 15.0 g/dL 12.9  12.6  12.6   Hematocrit 36.0 - 46.0 % 38.7  37.2  38.3   Platelets 150 - 400 K/uL 251  254  279    .    Latest Ref Rng & Units 02/18/2022   12:35 PM 07/04/2021   11:22 AM 01/01/2021   10:53 AM  CMP  Glucose 70 - 99 mg/dL 98  90  95   BUN 8 - 23 mg/dL '10  11  9   '$ Creatinine 0.44 - 1.00 mg/dL 0.66  0.82  0.79   Sodium 135 - 145 mmol/L 137  138  139   Potassium 3.5 - 5.1 mmol/L 3.9  4.0  4.2   Chloride 98 - 111 mmol/L 107  106  108   CO2 22 - 32 mmol/L '25  27  23   '$ Calcium 8.9 - 10.3 mg/dL 9.7  9.6  9.6   Total Protein 6.5 - 8.1 g/dL 7.2  7.3  7.5   Total Bilirubin 0.3 - 1.2 mg/dL 0.6  0.5  0.7   Alkaline Phos 38 - 126  U/L 82  75  82   AST 15 - 41 U/L  $'17  18  18   'S$ ALT 0 - 44 U/L '8  11  8    '$ Sed rate 15 (WNL)    08/12/17 Lymph Node Needle/core Biopsy:   08/12/17 Tissue Flow Cytometry:    09/22/17 Tissue Flow Cytometry    09/22/17 Lymph Node Pathology:   RADIOGRAPHIC STUDIES: I have personally reviewed the radiological images as listed and agreed with the findings in the report. No results found.  ASSESSMENT & PLAN:   76 y.o. female with  1. Stage IA Hodgkin's Lymphoma (mixed cellularity) No constitutional symptoms Sed rate elevated to 30 PET findings most consistent with Stage I disease; discussed that a small hypermetabolic nodule found in her liver has remain unchanged for 4 years and is not considered to be involved with her Hodgkin's lymphoma.  -Left cervical lymph nodes are involved without further involvement.  01/15/18 PET/CT  revealed Interval complete response of left cervical hypermetabolic lymphadenopathy, now [place] 2. Stable 1.9 cm hypermetabolic retroperitoneal soft tissue nodule in the posterior right upper quadrant. Indeterminate. Tiny left groin lymph nodes are unchanged in size since prior PET-CT and 1 of these demonstrates a clear central fatty hilum. Both are FDG avid on today's exam (not seen previously). Given response of cervical disease, these features may reflect reactive etiology and attention on follow-up suggested. Stable fusiform descending thoracic aortic aneurysm. Follow-up CT in 1 year could be used to reassess.   Plan Patient has no clinical or lab evidence of Hodgkin's lymphoma recurrence at this time Labs done today show stable CBC and CMP and sedimentation rate within normal limits at 15. No indication for additional work-up or treatment of patient's Hodgkin's lymphoma at this time. Recommended she follow-up with her primary care physician to stay up to speed with age-appropriate vaccinations including her upcoming flu shot new COVID-19 vaccine and to stay up  to speed with her pneumonia vaccines.. She will switch follow-ups to yearly at this time.  FOLLOW UP: RTC with Dr. Irene Limbo with labs in 12 months   The total time spent in the appointment was 20 minutes*.  All of the patient's questions were answered with apparent satisfaction. The patient knows to call the clinic with any problems, questions or concerns.   Sullivan Lone MD MS AAHIVMS [facility] Hematology/Oncology Physician [facility]  .*Total Encounter Time as defined by the Centers for Medicare and Medicaid Services includes, in addition to the face-to-face time of a patient visit (documented in the note above) non-face-to-face time: obtaining and reviewing outside history, ordering and reviewing medications, tests or procedures, care coordination (communications with other health care professionals or caregivers) and documentation in the medical record.

## 2022-03-31 DIAGNOSIS — N3000 Acute cystitis without hematuria: Secondary | ICD-10-CM | POA: Diagnosis not present

## 2022-03-31 DIAGNOSIS — R350 Frequency of micturition: Secondary | ICD-10-CM | POA: Diagnosis not present

## 2022-04-16 DIAGNOSIS — R079 Chest pain, unspecified: Secondary | ICD-10-CM | POA: Diagnosis not present

## 2022-07-09 ENCOUNTER — Other Ambulatory Visit: Payer: Self-pay | Admitting: Family Medicine

## 2022-07-09 DIAGNOSIS — Z1231 Encounter for screening mammogram for malignant neoplasm of breast: Secondary | ICD-10-CM

## 2022-07-23 ENCOUNTER — Ambulatory Visit: Payer: Medicare HMO

## 2022-07-25 ENCOUNTER — Encounter (INDEPENDENT_AMBULATORY_CARE_PROVIDER_SITE_OTHER): Payer: Medicare HMO | Admitting: Ophthalmology

## 2022-07-25 DIAGNOSIS — H348312 Tributary (branch) retinal vein occlusion, right eye, stable: Secondary | ICD-10-CM | POA: Diagnosis not present

## 2022-07-25 DIAGNOSIS — H33322 Round hole, left eye: Secondary | ICD-10-CM | POA: Diagnosis not present

## 2022-08-12 DIAGNOSIS — D1801 Hemangioma of skin and subcutaneous tissue: Secondary | ICD-10-CM | POA: Diagnosis not present

## 2022-08-12 DIAGNOSIS — D485 Neoplasm of uncertain behavior of skin: Secondary | ICD-10-CM | POA: Diagnosis not present

## 2022-08-12 DIAGNOSIS — Z85828 Personal history of other malignant neoplasm of skin: Secondary | ICD-10-CM | POA: Diagnosis not present

## 2022-08-12 DIAGNOSIS — H61022 Chronic perichondritis of left external ear: Secondary | ICD-10-CM | POA: Diagnosis not present

## 2022-08-12 DIAGNOSIS — L570 Actinic keratosis: Secondary | ICD-10-CM | POA: Diagnosis not present

## 2022-08-12 DIAGNOSIS — L814 Other melanin hyperpigmentation: Secondary | ICD-10-CM | POA: Diagnosis not present

## 2022-08-21 DIAGNOSIS — Z6831 Body mass index (BMI) 31.0-31.9, adult: Secondary | ICD-10-CM | POA: Diagnosis not present

## 2022-08-21 DIAGNOSIS — K920 Hematemesis: Secondary | ICD-10-CM | POA: Diagnosis not present

## 2022-09-10 ENCOUNTER — Ambulatory Visit
Admission: RE | Admit: 2022-09-10 | Discharge: 2022-09-10 | Disposition: A | Discharge status: Home / Self Care | Payer: Medicare HMO | Source: Ambulatory Visit | Attending: Family Medicine | Admitting: Family Medicine

## 2022-09-10 DIAGNOSIS — Z1231 Encounter for screening mammogram for malignant neoplasm of breast: Secondary | ICD-10-CM

## 2022-10-11 DIAGNOSIS — R110 Nausea: Secondary | ICD-10-CM | POA: Diagnosis not present

## 2022-10-11 DIAGNOSIS — K5900 Constipation, unspecified: Secondary | ICD-10-CM | POA: Diagnosis not present

## 2022-10-11 DIAGNOSIS — K219 Gastro-esophageal reflux disease without esophagitis: Secondary | ICD-10-CM | POA: Diagnosis not present

## 2022-11-25 DIAGNOSIS — E663 Overweight: Secondary | ICD-10-CM | POA: Diagnosis not present

## 2022-11-25 DIAGNOSIS — Z0000 Encounter for general adult medical examination without abnormal findings: Secondary | ICD-10-CM | POA: Diagnosis not present

## 2022-11-25 DIAGNOSIS — Z1389 Encounter for screening for other disorder: Secondary | ICD-10-CM | POA: Diagnosis not present

## 2022-12-02 ENCOUNTER — Other Ambulatory Visit: Payer: Self-pay | Admitting: Family Medicine

## 2022-12-02 DIAGNOSIS — K219 Gastro-esophageal reflux disease without esophagitis: Secondary | ICD-10-CM | POA: Diagnosis not present

## 2022-12-02 DIAGNOSIS — R110 Nausea: Secondary | ICD-10-CM | POA: Diagnosis not present

## 2022-12-02 DIAGNOSIS — R634 Abnormal weight loss: Secondary | ICD-10-CM | POA: Diagnosis not present

## 2022-12-02 DIAGNOSIS — R002 Palpitations: Secondary | ICD-10-CM | POA: Diagnosis not present

## 2022-12-02 DIAGNOSIS — I712 Thoracic aortic aneurysm, without rupture, unspecified: Secondary | ICD-10-CM | POA: Diagnosis not present

## 2022-12-02 DIAGNOSIS — C8191 Hodgkin lymphoma, unspecified, lymph nodes of head, face, and neck: Secondary | ICD-10-CM | POA: Diagnosis not present

## 2022-12-02 DIAGNOSIS — Z6826 Body mass index (BMI) 26.0-26.9, adult: Secondary | ICD-10-CM | POA: Diagnosis not present

## 2022-12-02 DIAGNOSIS — E7800 Pure hypercholesterolemia, unspecified: Secondary | ICD-10-CM | POA: Diagnosis not present

## 2022-12-02 DIAGNOSIS — R61 Generalized hyperhidrosis: Secondary | ICD-10-CM | POA: Diagnosis not present

## 2022-12-13 ENCOUNTER — Ambulatory Visit
Admission: RE | Admit: 2022-12-13 | Discharge: 2022-12-13 | Disposition: A | Discharge status: Home / Self Care | Payer: Medicare HMO | Source: Ambulatory Visit | Attending: Family Medicine | Admitting: Family Medicine

## 2022-12-13 DIAGNOSIS — R634 Abnormal weight loss: Secondary | ICD-10-CM | POA: Diagnosis not present

## 2022-12-13 MED ORDER — IOPAMIDOL (ISOVUE-300) INJECTION 61%
100.0000 mL | Freq: Once | INTRAVENOUS | Status: AC | PRN
  Administered 2022-12-13: 100 mL via INTRAVENOUS

## 2022-12-18 DIAGNOSIS — R634 Abnormal weight loss: Secondary | ICD-10-CM | POA: Diagnosis not present

## 2022-12-18 DIAGNOSIS — Z6829 Body mass index (BMI) 29.0-29.9, adult: Secondary | ICD-10-CM | POA: Diagnosis not present

## 2022-12-18 DIAGNOSIS — Z0000 Encounter for general adult medical examination without abnormal findings: Secondary | ICD-10-CM | POA: Diagnosis not present

## 2022-12-18 DIAGNOSIS — R110 Nausea: Secondary | ICD-10-CM | POA: Diagnosis not present

## 2022-12-18 DIAGNOSIS — R002 Palpitations: Secondary | ICD-10-CM | POA: Diagnosis not present

## 2022-12-18 DIAGNOSIS — R61 Generalized hyperhidrosis: Secondary | ICD-10-CM | POA: Diagnosis not present

## 2023-01-01 ENCOUNTER — Other Ambulatory Visit: Payer: Self-pay

## 2023-01-01 DIAGNOSIS — C8191 Hodgkin lymphoma, unspecified, lymph nodes of head, face, and neck: Secondary | ICD-10-CM

## 2023-01-02 ENCOUNTER — Other Ambulatory Visit: Payer: Self-pay

## 2023-01-02 ENCOUNTER — Inpatient Hospital Stay: Payer: Medicare HMO | Admitting: Hematology

## 2023-01-02 ENCOUNTER — Inpatient Hospital Stay: Payer: Medicare HMO | Attending: Hematology

## 2023-01-02 VITALS — BP 126/93 | HR 115 | Temp 97.7°F | Wt 185.6 lb

## 2023-01-02 DIAGNOSIS — C8191 Hodgkin lymphoma, unspecified, lymph nodes of head, face, and neck: Secondary | ICD-10-CM

## 2023-01-02 DIAGNOSIS — C8120 Mixed cellularity classical Hodgkin lymphoma, unspecified site: Secondary | ICD-10-CM | POA: Insufficient documentation

## 2023-01-02 LAB — CBC WITH DIFFERENTIAL (CANCER CENTER ONLY)
Abs Immature Granulocytes: 0.01 10*3/uL (ref 0.00–0.07)
Basophils Absolute: 0.1 10*3/uL (ref 0.0–0.1)
Basophils Relative: 1 %
Eosinophils Absolute: 0.2 10*3/uL (ref 0.0–0.5)
Eosinophils Relative: 4 %
HCT: 39.3 % (ref 36.0–46.0)
Hemoglobin: 13.1 g/dL (ref 12.0–15.0)
Immature Granulocytes: 0 %
Lymphocytes Relative: 22 %
Lymphs Abs: 0.9 10*3/uL (ref 0.7–4.0)
MCH: 27.6 pg (ref 26.0–34.0)
MCHC: 33.3 g/dL (ref 30.0–36.0)
MCV: 82.9 fL (ref 80.0–100.0)
Monocytes Absolute: 0.7 10*3/uL (ref 0.1–1.0)
Monocytes Relative: 17 %
Neutro Abs: 2.3 10*3/uL (ref 1.7–7.7)
Neutrophils Relative %: 56 %
Platelet Count: 241 10*3/uL (ref 150–400)
RBC: 4.74 MIL/uL (ref 3.87–5.11)
RDW: 14.2 % (ref 11.5–15.5)
WBC Count: 4.1 10*3/uL (ref 4.0–10.5)
nRBC: 0 % (ref 0.0–0.2)

## 2023-01-02 LAB — CMP (CANCER CENTER ONLY)
ALT: 12 U/L (ref 0–44)
AST: 22 U/L (ref 15–41)
Albumin: 3.4 g/dL — ABNORMAL LOW (ref 3.5–5.0)
Alkaline Phosphatase: 143 U/L — ABNORMAL HIGH (ref 38–126)
Anion gap: 7 (ref 5–15)
BUN: 12 mg/dL (ref 8–23)
CO2: 28 mmol/L (ref 22–32)
Calcium: 10.4 mg/dL — ABNORMAL HIGH (ref 8.9–10.3)
Chloride: 101 mmol/L (ref 98–111)
Creatinine: 0.86 mg/dL (ref 0.44–1.00)
GFR, Estimated: >60 mL/min (ref >60)
Glucose, Bld: 93 mg/dL (ref 70–99)
Potassium: 3.8 mmol/L (ref 3.5–5.1)
Sodium: 136 mmol/L (ref 135–145)
Total Bilirubin: 0.8 mg/dL (ref 0.3–1.2)
Total Protein: 6.9 g/dL (ref 6.5–8.1)

## 2023-01-02 LAB — SEDIMENTATION RATE: Sed Rate: 5 mm/h (ref 0–22)

## 2023-01-02 NOTE — Progress Notes (Signed)
HEMATOLOGY/ONCOLOGY CLINIC NOTE  Date of Service: 01/02/23   Patient Care Team: [name], MD as PCP - General (Family Medicine)  CHIEF COMPLAINTS/PURPOSE OF CONSULTATION:  Follow-up for continued surveillance of Hodgkin's lymphoma  HISTORY OF PRESENTING ILLNESS:   Please see previous notes for details on initial presentation  Interval History:   [name] is here for continued evaluation and management of her Hodgkin's lymphoma. Patient was last seen by me on 02/18/2022 and was doing well overall with no new medical concerns.   Today, she is accompanied by her niece. She reports that beginning October 2023, she noticed unexpected weight loss with lack of appetite. She notes that she could no longer tolerate sweet foods or coffee which was a new change. She has lost 22 pounds since October 2023. Patient reports that she forces herself to eat as food does not generally taste pleasant. She moves her bowels regularly.   She complains of palpitations and her PCP started her on Toprolol on 12/02/2022 for irregular HR. Patient did feel palpitations earlier today and measured her HR at 115 bpm. She regularly monitors her BP at home, which is generally 120/70.   Patient also complains of increased fatigue which limits her ability to complete activities.  She complains of drenching night sweats with warm sensation which began 3-4 months ago.   She denies any new lumps/bumps, new skin rashes, itching, abdominal pain/distension, SOB, chest pain, back pain, or abdominal pain. She denies endorsing any recent infections such as COVID-19 infection or stomach flu  She is not on any calcium supplements or diuretics at this time. She does stay welll hydrated.  Patient has connected with vascular surgeon. She was seen by Dr. Pattricia Boss at [facility] on 02/13/2023 and surgery options for aortic aneurysms were discussed with her. Patient does not wish to proceed with surgery at this time.   Patient  functions fairly well at home. She notes that she does have a cane at home, but does not use it frequently.   CT showed several new lymph nodes in abdomen and pelvis.   MEDICAL HISTORY:  Past Medical History:  Diagnosis Date   AAA (abdominal aortic aneurysm) (HCC)    BPPV (benign paroxysmal positional vertigo)    Cancer (HCC)    H/O seasonal allergies    Hearing loss in right ear    Left cervical lymphadenopathy    Macular degeneration    Obesity    Osteopenia    Pre-syncope 11/09/2017   Retinal vein occlusion    Vitamin D deficiency     SURGICAL HISTORY: Past Surgical History:  Procedure Laterality Date   CATARACT EXTRACTION, BILATERAL     CHOLECYSTECTOMY     IR FLUORO GUIDE PORT INSERTION RIGHT  10/10/2017   IR REMOVAL TUN ACCESS W/ PORT W/O FL MOD SED  02/05/2018   IR US GUIDE VASC ACCESS RIGHT  10/10/2017   MASS EXCISION Left 09/22/2017   Procedure: LEFT CERVICAL LYMPH NODE EXCISION;  Surgeon: Serena Colonel, MD;  Location: [facility];  Service: ENT;  Laterality: Left;   TUBAL LIGATION      SOCIAL HISTORY: Social History   Socioeconomic History   Marital status: Single    Spouse name: Not on file   Number of children: Not on file   Years of education: Not on file   Highest education level: Not on file  Occupational History   Not on file  Tobacco Use   Smoking status: Former  Current packs/day: 0.00    Types: Cigarettes    Quit date: 06/24/1988    Years since quitting: 34.5   Smokeless tobacco: Never  Vaping Use   Vaping status: Never Used  Substance and Sexual Activity   Alcohol use: No   Drug use: No   Sexual activity: Not on file  Other Topics Concern   Not on file  Social History Narrative   Not on file   Social Determinants of Health   Financial Resource Strain: Not on file  Food Insecurity: Not on file  Transportation Needs: Not on file  Physical Activity: Not on file  Stress: Not on file  Social Connections: Not on file   Intimate Partner Violence: Not on file    FAMILY HISTORY: Family History  Problem Relation Age of Onset   Cancer Mother        liver   Heart attack Father    Hypertension Father    Diabetes Mellitus I Father    Cancer Sister        pancreatic, breast   Diabetes Mellitus I Sister    Heart disease Brother    Heart disease Brother    Heart disease Brother    Diabetes Mellitus I Sister    Diabetes Mellitus I Sister     ALLERGIES:  is allergic to cephalexin, doxycycline, levofloxacin, prilosec [omeprazole], and singulair [montelukast].  MEDICATIONS:  Current Outpatient Medications  Medication Sig Dispense Refill   acetaminophen (TYLENOL) 500 MG tablet Take 500 mg by mouth every 8 (eight) hours as needed for mild pain or moderate pain.      atorvastatin (LIPITOR) 10 MG tablet      cholecalciferol (VITAMIN D3) 25 MCG (1000 UNIT) tablet      Cyanocobalamin (B-12) 1000 MCG CAPS  (Patient not taking: Reported on 07/04/2021)     Multiple Vitamins-Minerals (CENTRUM MULTIGUMMIES) CHEW      pantoprazole (PROTONIX) 40 MG tablet Take 40 mg by mouth 2 (two) times daily.     polyethylene glycol (MIRALAX / GLYCOLAX) packet Take 17 g by mouth daily.     tacrolimus (PROTOPIC) 0.1 % ointment Apply 1 application topically 2 (two) times daily.     triamcinolone (NASACORT) 55 MCG/ACT AERO nasal inhaler Place 2 sprays into the nose daily as needed (allergies).      No current facility-administered medications for this visit.    REVIEW OF SYSTEMS:    10 Point review of Systems was done is negative except as noted above.   PHYSICAL EXAMINATION: ECOG PERFORMANCE STATUS: 1 .BP (!) 126/93   Pulse (!) 115   Temp 97.7 F (36.5 C)   Wt 185 lb 9.6 oz (84.2 kg)   SpO2 98%   BMI 28.22 kg/m   GENERAL:alert, in no acute distress and comfortable SKIN: no acute rashes, no significant lesions EYES: conjunctiva are pink and non-injected, sclera anicteric OROPHARYNX: MMM, no exudates, no oropharyngeal  erythema or ulceration NECK: supple, no JVD LYMPH:  no palpable lymphadenopathy in the cervical, axillary or inguinal regions LUNGS: clear to auscultation b/l with normal respiratory effort HEART: regular rate & rhythm ABDOMEN:  normoactive bowel sounds , non tender, not distended. Extremity: no pedal edema PSYCH: alert & oriented x 3 with fluent speech NEURO: no focal motor/sensory deficits   LABORATORY DATA:  I have reviewed the data as listed  .    Latest Ref Rng & Units 01/02/2023    9:45 AM 02/18/2022   12:35 PM 07/04/2021   11:22 AM  CBC  WBC 4.0 - 10.5 K/uL 4.1  6.6  5.9   Hemoglobin 12.0 - 15.0 g/dL 16.1  09.6  04.5   Hematocrit 36.0 - 46.0 % 39.3  38.7  37.2   Platelets 150 - 400 K/uL 241  251  254    .    Latest Ref Rng & Units 01/02/2023    9:45 AM 02/18/2022   12:35 PM 07/04/2021   11:22 AM  CMP  Glucose 70 - 99 mg/dL 93  98  90   BUN 8 - 23 mg/dL 12  10  11    Creatinine 0.44 - 1.00 mg/dL 4.09  8.11  9.14   Sodium 135 - 145 mmol/L 136  137  138   Potassium 3.5 - 5.1 mmol/L 3.8  3.9  4.0   Chloride 98 - 111 mmol/L 101  107  106   CO2 22 - 32 mmol/L 28  25  27    Calcium 8.9 - 10.3 mg/dL 78.2  9.7  9.6   Total Protein 6.5 - 8.1 g/dL 6.9  7.2  7.3   Total Bilirubin 0.3 - 1.2 mg/dL 0.8  0.6  0.5   Alkaline Phos 38 - 126 U/L 143  82  75   AST 15 - 41 U/L 22  17  18    ALT 0 - 44 U/L 12  8  11     Sed rate 15 (WNL)    08/12/17 Lymph Node Needle/core Biopsy:   08/12/17 Tissue Flow Cytometry:    09/22/17 Tissue Flow Cytometry    09/22/17 Lymph Node Pathology:   RADIOGRAPHIC STUDIES: I have personally reviewed the radiological images as listed and agreed with the findings in the report. CT ABDOMEN PELVIS W CONTRAST  Result Date: 12/20/2022 CLINICAL DATA:  Weight loss, loss of appetite and history of Hodgkin's lymphoma with prior chemotherapy. Known aneurysmal disease of the thoracic and proximal abdominal aorta. EXAM: CT ABDOMEN AND PELVIS WITH CONTRAST  TECHNIQUE: Multidetector CT imaging of the abdomen and pelvis was performed using the standard protocol following bolus administration of intravenous contrast. RADIATION DOSE REDUCTION: This exam was performed according to the departmental dose-optimization program which includes automated exposure control, adjustment of the mA and/or kV according to patient size and/or use of iterative reconstruction technique. CONTRAST:  ISOVUE-300 IOPAMIDOL (ISOVUE-300) INJECTION 61% COMPARISON:  CT a of the abdomen and pelvis on 01/31/2021 and CT of the chest on 01/09/2021. Additional prior studies. FINDINGS: Lower chest: There is enlargement of the distal thoracic aortic aneurysm since 2022 with maximal transverse diameter of 6.2 cm compared to approximately 5.1 cm at the same level on the prior CT. See additional aortic discussion below. Hepatobiliary: No focal liver abnormality is seen. Status post cholecystectomy. No biliary dilatation. Pancreas: Stable atrophic pancreas demonstrating partial fatty replacement. No pancreatic ductal dilatation or surrounding inflammatory changes. Spleen: Normal in size without focal abnormality. Adrenals/Urinary Tract: Stable left adrenal nodule measuring approximately 1.5 cm in greatest diameter which has been seen on prior studies and is consistent with an adenoma. This is stable dating back to 2015 and therefore benign, requiring no additional dedicated follow-up. Normal appearance of kidneys and bladder. Stomach/Bowel: Bowel shows no evidence of obstruction, ileus, inflammation or lesion. The appendix is normal. No free intraperitoneal air. Stable diverticulosis of the sigmoid and descending colon without evidence of diverticulitis. Vascular/Lymphatic: Increase in aneurysmal dilatation of the proximal abdominal aorta which is contiguous with the aneurysmal dilatation of the distal thoracic aorta. Maximal diameter is approximately 5.5-5.6 cm  comparing both axial and sagittal imaging  compared to approximately 4.1 cm on the prior study. The aneurysm segment also contains a significant increase in irregular mural thrombus. Increased caliber of multiple celiac, gastrohepatic, retroperitoneal and iliac lymph nodes since the prior CT in 2022. The largest single lymph node is a roughly 1.7 x 2.0 cm left common iliac lymph node on image 61/2 just anterior to the right common iliac artery. Largest para-aortic lymph node to the left of the abdominal aorta on image 46 measures 1.3 cm in short axis. Reproductive: Uterus and bilateral adnexa are unremarkable. Other: No abdominal wall hernia or abnormality. No abdominopelvic ascites. Musculoskeletal: Osteopenia and lower lumbar degenerative disc disease. No bony lesions or fractures identified in the abdomen or pelvis. IMPRESSION: 1. Increase in aneurysmal dilatation of the distal thoracic aorta and proximal abdominal aorta since the prior CT in 2022. Maximal diameter of the distal thoracic aorta is 6.2 cm compared to 5.1 cm on the prior study. Maximal diameter of the proximal abdominal aorta is 5.5-5.6 cm compared to 4.1 cm on the prior study. Recommend referral back to Vascular Surgery who has seen the patient in the past. 2. Increased caliber of multiple celiac, gastrohepatic, retroperitoneal and iliac lymph nodes since the prior CT in 2022. The largest single lymph node is a roughly 1.7 x 2.0 cm left common iliac lymph node just anterior to the right common iliac artery. Largest para-aortic lymph node to the left of the abdominal aorta measures 1.3 cm in short axis. Findings are suspicious for recurrent lymphoma. Recommend referral back to oncology and consideration for further imaging with PET-CT to evaluate metabolic activity in the lymph nodes. 3. Stable left adrenal adenoma. 4. Stable diverticulosis of the sigmoid and descending colon without evidence of diverticulitis. Electronically Signed   By: Irish Lack M.D.   On: 12/20/2022 12:58     ASSESSMENT & PLAN:   77 y.o. female with  1. Stage IA Hodgkin's Lymphoma (mixed cellularity) No constitutional symptoms Sed rate elevated to 30 PET findings most consistent with Stage I disease; discussed that a small hypermetabolic nodule found in her liver has remain unchanged for 4 years and is not considered to be involved with her Hodgkin's lymphoma.  -Left cervical lymph nodes are involved without further involvement.  01/15/18 PET/CT  revealed Interval complete response of left cervical hypermetabolic lymphadenopathy, now [place] 2. Stable 1.9 cm hypermetabolic retroperitoneal soft tissue nodule in the posterior right upper quadrant. Indeterminate. Tiny left groin lymph nodes are unchanged in size since prior PET-CT and 1 of these demonstrates a clear central fatty hilum. Both are FDG avid on today's exam (not seen previously). Given response of cervical disease, these features may reflect reactive etiology and attention on follow-up suggested. Stable fusiform descending thoracic aortic aneurysm. Follow-up CT in 1 year could be used to reassess.   Plan   -Discussed lab results on 01/02/2023 in detail with patient. CBC normal, showed WBC of 4.1K, hemoglobin of 13.1, and platelets of 241K. -CMP shows some change in alkaline phosphatase, other liver enzymes normal -CMP also shows borderline elevated calcium level at 10.4. This may be potentially due to dehydration, hormones, or lymphoma -sedimentation rate lab is 5 -Discussed results of 12/13/2022 CT abdomen scan which showed Maximal diameter of the distal thoracic aorta is 6.2 cm compared to 5.1 cm on the prior study. Maximal diameter of the proximal abdominal aorta is 5.5-5.6 cm compared to 4.1 cm on the prior study. Also showed a Increased caliber of  multiple celiac, gastrohepatic, retroperitoneal and iliac lymph nodes about an inch in size since the prior CT in 2022  -informed patient that dehydration and anemia may elevate the heart  rate, though patient is not anemic at this time -informed patient that lymph nodes in abdomen may cause indirect symptoms such as loss of appetite or weight loss -patient has decided that she would not like to address aneurysm with surgery -informed patient that enlarged aortic aneurysms may push on important organs and cause weight loss. Discussed the increased risk of rupture with increased size of aneurysm -discussed potential option of intervascular strengthening stent graft to address aortic aneurysm. Recommend patient to connect with vascular surgeon to discuss potential treatment options besides surgery.  -discussed potential role for bone marrow biopsy if needed -if PET scan does show findings consistent with hodgkin's lymphoma, would treat with a targeted approach potentially involving immunotherapy rather than chemotherapy -advised patient to connect with cardiology to potentially be seen sooner then her scheduled appointment at the end of August -discussed option for patient to connect with PCP to increase betablocker from 25 mg to control HR while also ensuring BP does not become too low -advised patient to present to the emergency room if her palpitations are significant and persistent -repeat PET scan to evaluate base of skull to mid thigh to evaluate lymph nodes -recommend patient to regularly use a cane or walking stick to provide more intelligence to the brain regarding body position and reducing the risk of falls significantly -discussed options of connecting with social worker to discuss living options -discussed options of home care, assisted living, and family support for additional support at home.  -answered all of patient's and her niece's questions in detail -discussed the need for cardiology evaluation with echocardiogram to evaluate for any change in valve function, especially with aortic aneurism significantly increased in size.  -continue to follow with PCP and cardiology  control BP and cholesterol well  FOLLOW-UP: Pet/ct in 1 weeks Appointment with Karena Addison in 2 weeks to discuss PET/CT  CT guided biopsy of iliac LN after PET/CT review RTC with Dr Candise Che with labs in 4 weeks  The total time spent in the appointment was 40 minutes* .  All of the patient's questions were answered with apparent satisfaction. The patient knows to call the clinic with any problems, questions or concerns.   Wyvonnia Lora MD MS AAHIVMS [facility] Hematology/Oncology Physician [facility]  .*Total Encounter Time as defined by the Centers for Medicare and Medicaid Services includes, in addition to the face-to-face time of a patient visit (documented in the note above) non-face-to-face time: obtaining and reviewing outside history, ordering and reviewing medications, tests or procedures, care coordination (communications with other health care professionals or caregivers) and documentation in the medical record.    I,Mitra Faeizi,acting as a Neurosurgeon for Wyvonnia Lora, MD.,have documented all relevant documentation on the behalf of Wyvonnia Lora, MD,as directed by  Wyvonnia Lora, MD while in the presence of Wyvonnia Lora, MD.  .I have reviewed the above documentation for accuracy and completeness, and I agree with the above. Johney Maine MD

## 2023-01-05 NOTE — Progress Notes (Signed)
Cardiology Office Note:  .   Date:  01/06/2023  ID:  Jennifer Juarez, DOB June 08, 1946, MRN [id number] PCP: [name], MD  [facility] Providers Cardiologist:  Sayde Lish  Click to update primary MD,subspecialty MD or APP then REFRESH:1}   History of Present Illness: .   Jennifer Juarez is a 77 y.o. female  with hx of hodgkins lymphoma,    We are asked to see her for further evaluation of palpitations  Has episodes of rapid HR up to 135 perhaps thn heart rate will come back down as she missed.  The rapid HR might last for 15 minutes  No cp , no dyspne, just feels fatigue  Has a descending throacic aneurism 6.2 cm Has seen Dr. Juanetta Gosling at [facility] and Dr.Farber ( vascular surgery at [facility])  I have asked her to connect with Dr Juanetta Gosling to see if there is any other options    ROS:   Studies Reviewed: Marland Kitchen   EKG Interpretation Date/Time:  Monday January 06 2023 13:12:47 EDT Ventricular Rate:  125 PR Interval:  168 QRS Duration:  70 QT Interval:  296 QTC Calculation: 427 R Axis:   -9  Text Interpretation: Sinus tachycardia Low voltage QRS Cannot rule out Inferior infarct , age undetermined Cannot rule out Anterior infarct , age undetermined When compared with ECG of 03-Dec-2017 15:24, Premature supraventricular complexes are no longer Present Vent. rate has increased BY  46 BPM Minimal criteria for Anterior infarct are now Present Minimal criteria for Inferior infarct are now Present Nonspecific T wave abnormality now evident in Inferior leads Confirmed by Kristeen Miss ([id number]) on 01/06/2023 1:32:23 PM    EKG Interpretation Date/Time:  Monday January 06 2023 13:12:47 EDT Ventricular Rate:  125 PR Interval:  168 QRS Duration:  70 QT Interval:  296 QTC Calculation: 427 R Axis:   -9  Text Interpretation: Sinus tachycardia Low voltage QRS Cannot rule out Inferior infarct , age undetermined Cannot rule out Anterior infarct , age undetermined When compared with ECG of 03-Dec-2017 15:24, Premature  supraventricular complexes are no longer Present Vent. rate has increased BY  46 BPM Minimal criteria for Anterior infarct are now Present Minimal criteria for Inferior infarct are now Present Nonspecific T wave abnormality now evident in Inferior leads Confirmed by Kristeen Miss ([id number]) on 01/06/2023 1:32:23 PM   Risk Assessment/Calculations:             Physical Exam:   VS:  BP 118/78   Pulse (!) 125   Ht 5\' 8"  (1.727 m)   Wt 185 lb 6.4 oz (84.1 kg)   SpO2 98%   BMI 28.19 kg/m    Wt Readings from Last 3 Encounters:  01/06/23 185 lb 6.4 oz (84.1 kg)  01/02/23 185 lb 9.6 oz (84.2 kg)  02/18/22 209 lb 9.6 oz (95.1 kg)    GEN: Well nourished, well developed in no acute distress NECK: No JVD; No carotid bruits CARDIAC: RR with episodes of intermittant tachycardia  RESPIRATORY:  Clear to auscultation without rales, wheezing or rhonchi  ABDOMEN: Soft, non-tender, non-distended EXTREMITIES:  No edema; No deformity   ASSESSMENT AND PLAN: .     1.  Sinus tachycardia: The patient presents with episodes of intermittent sinus tachycardia.  EKG today shows that the palpitations that she has been having.  She has fairly regular and normal heart rate with intermittent episodes of very fast atrial tachycardia.  TSH was checked last month and is normal.  She is on metoprolol  succinate 25 mg a day.  She thinks that it might be helping a little bit but she continues to have these episodes of tachypalpitations.  Will increase the metoprolol succinate to 50 mg a day.  Will get an echocardiogram.  2. Aortic aneurism :   CT shows a descending thoracic aortic aneurism.   She has seen Dr. Juanetta Gosling here at [facility] and Dr. Pattricia Boss at [facility]  Her last CT scan showed an aneurysm size of 6.2 cm compared to 5.1 cm at her last CT scan.  There may be a stent graft that could be placed which may help with this aneurysm that does not involve major surgery.  I would like for her to follow-up with Dr. Juanetta Gosling to get some  advice regarding her options.  At this size, the aneurysm will likely start enlarging very quickly.        Dispo:    Signed, Kristeen Miss, MD

## 2023-01-06 ENCOUNTER — Encounter: Payer: Self-pay | Admitting: Cardiovascular Disease

## 2023-01-06 ENCOUNTER — Ambulatory Visit: Payer: Medicare HMO | Attending: Cardiovascular Disease | Admitting: Cardiovascular Disease

## 2023-01-06 VITALS — BP 118/78 | HR 125 | Ht 68.0 in | Wt 185.4 lb

## 2023-01-06 DIAGNOSIS — R55 Syncope and collapse: Secondary | ICD-10-CM

## 2023-01-06 DIAGNOSIS — R000 Tachycardia, unspecified: Secondary | ICD-10-CM | POA: Diagnosis not present

## 2023-01-06 DIAGNOSIS — R002 Palpitations: Secondary | ICD-10-CM | POA: Diagnosis not present

## 2023-01-06 MED ORDER — METOPROLOL SUCCINATE ER 50 MG PO TB24: 50.0000 mg | ORAL_TABLET | Freq: Every day | ORAL | 3 refills | Status: DC

## 2023-01-06 NOTE — Patient Instructions (Signed)
Medication Instructions:  INCREASE Toprol/Metoprolol Succinate to 50mg  daily *If you need a refill on your cardiac medications before your next appointment, please call your pharmacy*  Lab Work: NONE If you have labs (blood work) drawn today and your tests are completely normal, you will receive your results only by: MyChart Message (if you have MyChart) OR A paper copy in the mail If you have any lab test that is abnormal or we need to change your treatment, we will call you to review the results.  Testing/Procedures: ECHO Your physician has requested that you have an echocardiogram. Echocardiography is a painless test that uses sound waves to create images of your heart. It provides your doctor with information about the size and shape of your heart and how well your heart's chambers and valves are working. This procedure takes approximately one hour. There are no restrictions for this procedure. Please do NOT wear cologne, perfume, aftershave, or lotions (deodorant is allowed). Please arrive 15 minutes prior to your appointment time.  Follow-Up: At [facility], you and your health needs are our priority.  As part of our continuing mission to provide you with exceptional heart care, we have created designated Provider Care Teams.  These Care Teams include your primary Cardiologist (physician) and Advanced Practice Providers (APPs -  Physician Assistants and Nurse Practitioners) who all work together to provide you with the care you need, when you need it.  Your next appointment:   3 month(s)  Provider:   Kristeen Miss, MD    Other Instructions **Please follow-up with your vascular surgeon in regards to your aneurysm**

## 2023-01-08 ENCOUNTER — Encounter: Payer: Self-pay | Admitting: Hematology

## 2023-01-13 ENCOUNTER — Telehealth: Payer: Self-pay | Admitting: Hematology

## 2023-01-13 NOTE — Telephone Encounter (Signed)
Patient is aware of upcoming appointment times/dates.  

## 2023-01-21 ENCOUNTER — Ambulatory Visit (HOSPITAL_COMMUNITY)
Admission: RE | Admit: 2023-01-21 | Discharge: 2023-01-21 | Disposition: A | Discharge status: Home / Self Care | Payer: Medicare HMO | Source: Ambulatory Visit | Attending: Hematology | Admitting: Hematology

## 2023-01-21 DIAGNOSIS — C8191 Hodgkin lymphoma, unspecified, lymph nodes of head, face, and neck: Secondary | ICD-10-CM | POA: Insufficient documentation

## 2023-01-21 LAB — GLUCOSE, CAPILLARY: Glucose-Capillary: 93 mg/dL (ref 70–99)

## 2023-01-21 MED ORDER — FLUDEOXYGLUCOSE F - 18 (FDG) INJECTION
8.9400 | Freq: Once | INTRAVENOUS | Status: AC
  Administered 2023-01-21: 8.94 via INTRAVENOUS

## 2023-01-22 DIAGNOSIS — C8193 Hodgkin lymphoma, unspecified, intra-abdominal lymph nodes: Secondary | ICD-10-CM | POA: Diagnosis not present

## 2023-01-24 DIAGNOSIS — R110 Nausea: Secondary | ICD-10-CM | POA: Diagnosis not present

## 2023-01-24 DIAGNOSIS — R000 Tachycardia, unspecified: Secondary | ICD-10-CM | POA: Diagnosis not present

## 2023-01-24 DIAGNOSIS — I10 Essential (primary) hypertension: Secondary | ICD-10-CM | POA: Diagnosis not present

## 2023-01-24 DIAGNOSIS — M549 Dorsalgia, unspecified: Secondary | ICD-10-CM | POA: Diagnosis not present

## 2023-01-25 ENCOUNTER — Other Ambulatory Visit: Payer: Self-pay

## 2023-01-25 ENCOUNTER — Encounter (HOSPITAL_COMMUNITY): Payer: Self-pay

## 2023-01-25 ENCOUNTER — Emergency Department (HOSPITAL_COMMUNITY)
Admission: EM | Admit: 2023-01-25 | Discharge: 2023-02-23 | Disposition: E | Payer: Medicare HMO | Source: Home / Self Care | Attending: Emergency Medicine | Admitting: Emergency Medicine

## 2023-01-25 DIAGNOSIS — Z8572 Personal history of non-Hodgkin lymphomas: Secondary | ICD-10-CM | POA: Diagnosis not present

## 2023-01-25 DIAGNOSIS — W19XXXA Unspecified fall, initial encounter: Secondary | ICD-10-CM | POA: Diagnosis not present

## 2023-01-25 DIAGNOSIS — R630 Anorexia: Secondary | ICD-10-CM | POA: Diagnosis not present

## 2023-01-25 DIAGNOSIS — R531 Weakness: Secondary | ICD-10-CM | POA: Diagnosis not present

## 2023-01-25 DIAGNOSIS — R42 Dizziness and giddiness: Secondary | ICD-10-CM | POA: Diagnosis not present

## 2023-01-25 DIAGNOSIS — I469 Cardiac arrest, cause unspecified: Secondary | ICD-10-CM | POA: Insufficient documentation

## 2023-01-25 LAB — CBC
HCT: 39.6 % (ref 36.0–46.0)
Hemoglobin: 13 g/dL (ref 12.0–15.0)
MCH: 27.4 pg (ref 26.0–34.0)
MCHC: 32.8 g/dL (ref 30.0–36.0)
MCV: 83.5 fL (ref 80.0–100.0)
Platelets: 198 10*3/uL (ref 150–400)
RBC: 4.74 MIL/uL (ref 3.87–5.11)
RDW: 15.2 % (ref 11.5–15.5)
WBC: 6.5 10*3/uL (ref 4.0–10.5)
nRBC: 0 % (ref 0.0–0.2)

## 2023-01-25 LAB — BASIC METABOLIC PANEL
Anion gap: 11 (ref 5–15)
BUN: 15 mg/dL (ref 8–23)
CO2: 22 mmol/L (ref 22–32)
Calcium: 9.8 mg/dL (ref 8.9–10.3)
Chloride: 97 mmol/L — ABNORMAL LOW (ref 98–111)
Creatinine, Ser: 0.92 mg/dL (ref 0.44–1.00)
GFR, Estimated: >60 mL/min (ref >60)
Glucose, Bld: 116 mg/dL — ABNORMAL HIGH (ref 70–99)
Potassium: 3.1 mmol/L — ABNORMAL LOW (ref 3.5–5.1)
Sodium: 130 mmol/L — ABNORMAL LOW (ref 135–145)

## 2023-01-25 LAB — CBG MONITORING, ED: Glucose-Capillary: 97 mg/dL (ref 70–99)

## 2023-01-25 LAB — TROPONIN I (HIGH SENSITIVITY): Troponin I (High Sensitivity): 11 ng/L (ref <18)

## 2023-01-25 MED ORDER — EPINEPHRINE 1 MG/10ML IJ SOSY
PREFILLED_SYRINGE | INTRAMUSCULAR | Status: AC
  Filled 2023-01-25: qty 10

## 2023-01-28 ENCOUNTER — Ambulatory Visit: Payer: Medicare HMO | Admitting: Physician Assistant

## 2023-01-29 ENCOUNTER — Other Ambulatory Visit (HOSPITAL_COMMUNITY): Payer: Medicare HMO

## 2023-02-17 ENCOUNTER — Ambulatory Visit: Payer: Medicare HMO | Admitting: Hematology

## 2023-02-17 ENCOUNTER — Ambulatory Visit: Payer: Medicare HMO | Admitting: Cardiovascular Disease

## 2023-02-17 ENCOUNTER — Other Ambulatory Visit: Payer: Medicare HMO

## 2023-02-19 ENCOUNTER — Other Ambulatory Visit: Payer: Medicare HMO

## 2023-02-19 ENCOUNTER — Ambulatory Visit: Payer: Medicare HMO | Admitting: Hematology

## 2023-02-23 NOTE — ED Provider Notes (Signed)
[place] EMERGENCY DEPARTMENT AT [facility] Provider Note   CSN: [id number] Arrival date & time: 02/09/2023  0051     History  Chief Complaint  Patient presents with   Weakness    Jennifer Juarez is a 77 y.o. female.  HPI   Patient with medical history including Hodgkin's lymphoma, distal thoracic aneurysm of 6.2, sinus tachycardia, presenting with complaints of fall.  Patient states that she was ambulating from her bed to the bathroom, on her way back to the bed she felt very weak, and was a unable to get her leg onto the bed. So  she slid down onto her butt, she states she did not actually strike her head or lose consciousness.  She states that felt just generalized weakness, not unilateral, there was no associated headaches, change in vision, paresthesias in the upper and/or lower extremities.  She states that over the last few weeks she has been feeling weak, she states it all started after she was started on metoprolol, she states that she has been getting lightheaded and dizzy when she goes from sitting to standing but has had no syncope.  She notes that she has been having some heart palpitations which comes and go.  during this event of weakness she denies any heart palpitations, lightheaded dizziness, any chest pain shortness of breath.  She denies any cardiac history, no history of PEs or DVTs.  She states that she has noticed a decrease in appetite, states that she has not been eating very much as she still has a lack of appetite, she does  any fevers chills cough congestion, denies any urinary symptoms.  Reviewed patient's chart was seen by cardiology July 15, diagnosed with sinus tachycardia, and her Metroprolol was up from 25 to 50 mg.  She was followed by oncology who recently performed CT scan showing enlarged lymph nodes concerning for possible recurrence of lymphoma she was scheduled for PET scan and had not underwent any treatment.  Home Medications Prior to  Admission medications   Medication Sig Start Date End Date Taking? Authorizing Provider  acetaminophen (TYLENOL) 500 MG tablet Take 500 mg by mouth every 8 (eight) hours as needed for mild pain or moderate pain.     [provider]  atorvastatin (LIPITOR) 10 MG tablet  11/23/19   [provider]  cholecalciferol (VITAMIN D3) 25 MCG (1000 UNIT) tablet  11/22/20   [provider]  Cyanocobalamin (B-12) 1000 MCG CAPS  09/23/19   [provider]  metoprolol succinate (TOPROL-XL) 50 MG 24 hr tablet Take 1 tablet (50 mg total) by mouth daily. Take with or immediately following a meal. 01/06/23   Nahser, Deloris Ping, MD  Multiple Vitamins-Minerals (CENTRUM MULTIGUMMIES) CHEW  05/24/18   [provider]  pantoprazole (PROTONIX) 40 MG tablet Take 40 mg by mouth 2 (two) times daily. 09/15/20   [provider]  polyethylene glycol (MIRALAX / GLYCOLAX) packet Take 17 g by mouth daily.    [provider]  tacrolimus (PROTOPIC) 0.1 % ointment Apply 1 application topically 2 (two) times daily. 08/16/20   [provider]  triamcinolone (NASACORT) 55 MCG/ACT AERO nasal inhaler Place 2 sprays into the nose daily as needed (allergies).     [provider]      Allergies    Cephalexin, Doxycycline, Levofloxacin, Prilosec [omeprazole], and Singulair [montelukast]    Review of Systems   Review of Systems  Constitutional:  Positive for fatigue. Negative for chills and fever.  Respiratory:  Negative for shortness of breath.   Cardiovascular:  Negative for chest pain.  Gastrointestinal:  Negative for abdominal pain.  Neurological:  Negative for headaches.    Physical Exam Updated Vital Signs BP (!) 140/97 (BP Location: Right Arm)   Pulse (!) 132   Temp 98.6 F (37 C) (Oral)   Resp 18   Ht 5\' 8"  (1.727 m)   Wt 80.7 kg   SpO2 96%   BMI 27.06 kg/m  Physical Exam Vitals and nursing note reviewed.  Constitutional:      General: She is not  in acute distress.    Appearance: She is not ill-appearing.  HENT:     Head: Normocephalic and atraumatic.     Nose: No congestion.  Eyes:     Conjunctiva/sclera: Conjunctivae normal.  Cardiovascular:     Rate and Rhythm: Tachycardia present. Rhythm irregular.     Pulses: Normal pulses.     Heart sounds: No murmur heard.    No friction rub. No gallop.  Pulmonary:     Effort: No respiratory distress.     Breath sounds: No wheezing, rhonchi or rales.  Musculoskeletal:     Right lower leg: No edema.     Left lower leg: No edema.     Comments: No unilateral leg swelling no calf tenderness no palpable cords.  Skin:    General: Skin is warm and dry.  Neurological:     Mental Status: She is alert.     GCS: GCS eye subscore is 4. GCS verbal subscore is 5. GCS motor subscore is 6.     Cranial Nerves: Cranial nerves 2-12 are intact.     Sensory: Sensation is intact.     Motor: No weakness.     Coordination: Romberg sign negative. Finger-Nose-Finger Test normal.     Comments: Cranial nerves II through XII grossly intact no difficulty with word finding, following two-step commands, there is no unilateral weakness present.  Psychiatric:        Mood and Affect: Mood normal.     ED Results / Procedures / Treatments   Labs (all labs ordered are listed, but only abnormal results are displayed) Labs Reviewed  BASIC METABOLIC PANEL - Abnormal; Notable for the following components:      Result Value   Sodium 130 (*)    Potassium 3.1 (*)    Chloride 97 (*)    Glucose, Bld 116 (*)    All other components within normal limits  CBC  CBG MONITORING, ED  TROPONIN I (HIGH SENSITIVITY)  TROPONIN I (HIGH SENSITIVITY)    EKG EKG Interpretation Date/Time:  Saturday January 25 2023 01:01:18 EDT Ventricular Rate:  125 PR Interval:  110 QRS Duration:  77 QT Interval:  320 QTC Calculation: 462 R Axis:   -35  Text Interpretation: Sinus tachycardia with irregular rate Inferior infarct, old  Consider anterior infarct When compared with ECG of 01/06/2023, No significant change was found Confirmed by Dione Booze ([id number]) on 02/06/2023 3:42:53 AM  Radiology No results found.  Procedures .Critical Care  Performed by: Carroll Sage, PA-C Authorized by: Carroll Sage, PA-C   Critical care provider statement:    Critical care time (minutes):  30   Critical care time was exclusive of:  Separately billable procedures and treating other patients   Critical care was necessary to treat or prevent imminent or life-threatening deterioration of the following conditions:  Cardiac failure   Critical care was time spent personally by me  on the following activities:  Development of treatment plan with patient or surrogate, evaluation of patient's response to treatment, examination of patient, ordering and review of laboratory studies, ordering and review of radiographic studies, ordering and performing treatments and interventions, pulse oximetry, re-evaluation of patient's condition and review of old charts   I assumed direction of critical care for this patient from another provider in my specialty: no   Procedure Name: Intubation Date/Time: 02-16-23 7:06 AM  Performed by: Carroll Sage, PA-CPre-anesthesia Checklist: Patient identified, Suction available and Patient being monitored Oxygen Delivery Method: Ambu bag Preoxygenation: Pre-oxygenation with 100% oxygen Ventilation: Two handed mask ventilation required Laryngoscope Size: Mac and 4 Grade View: Grade II Tube size: 7.0 mm Number of attempts: 1 Airway Equipment and Method: Rigid stylet Placement Confirmation: ETT inserted through vocal cords under direct vision, CO2 detector and Breath sounds checked- equal and bilateral Tube secured with: ETT holder Dental Injury: Teeth and Oropharynx as per pre-operative assessment         Medications Ordered in ED Medications  EPINEPHrine (ADRENALIN) 1 MG/10ML injection (has  no administration in time range)    ED Course/ Medical Decision Making/ A&P Clinical Course as of 02-16-2023 0729  Sat 02-16-23  0214 Pronounced dead after unsuccessful resuscitation for approx 23 min [PC]    Clinical Course User Index [PC] Cardama, Amadeo Garnet, MD                                 Medical Decision Making Amount and/or Complexity of Data Reviewed Labs: ordered.   This patient presents to the ED for concern of fall, weakness, this involves an extensive number of treatment options, and is a complaint that carries with it a high risk of complications and morbidity.  The differential diagnosis includes ACS, PE, ACS, CVA, sepsis    Additional history obtained:  Additional history obtained from family at bedside External records from outside source obtained and reviewed including cardiology notes   Co morbidities that complicate the patient evaluation  Lymphoma, aneurysm  Social Determinants of Health:  Geriatric    Lab Tests:  I Ordered, and personally interpreted labs.  The pertinent results include: CBC unremarkable, BMP revealed sodium 130, potassium 3.1, chloride 97, glucose 116, pressure opponent is negative glucose 97   Imaging Studies ordered:  I ordered imaging studies including N/A I independently visualized and interpreted imaging which showed N/A I agree with the radiologist interpretation   Cardiac Monitoring:  The patient was maintained on a cardiac monitor.  I personally viewed and interpreted the cardiac monitored which showed an underlying rhythm of: Sinus arrhythmia   Medicines ordered and prescription drug management:  I ordered medication including epinephrine I have reviewed the patients home medicines and have made adjustments as needed  Critical Interventions:  CPR Intubation   Reevaluation:  After I finished evaluating the patient, patient suddenly endorsed chest pain, became pale and unresponsive, patient no  palpate  carotid or femoral pulse, CODE BLUE was activated, CPR initiated, Dr. Eudelia Bunch was made aware came to the room.  At Time of activation of CODE BLUE patient appeared to be in PEA.  Advance life support was provided, after 7 rounds of epinephrine, ultrasound was used to detect cardiac activity but no activity was  present.  Time of death was pronounced at 02 13.    Consultations Obtained:  N/A    Test Considered:  N/A  Final Clinical Impression(s) / ED Diagnoses Final diagnoses:  Cardiac arrest [facility])    Rx / DC Orders ED Discharge Orders     None         Carroll Sage, PA-C 02/16/2023 [id number]    Nira Conn, MD 02/10/2023 2006

## 2023-02-23 NOTE — ED Provider Notes (Signed)
Attestation:   I provided a substantive portion of the care of this patient. I personally provided more than half of the total time dedicated to treatment of this patient. I personally made/approved the management plan for this patient and take responsibility for the patient management.    Briefly, the patient is a 77 y.o. female I was called into the room for unconscious patient who lost pulses.  CODE BLUE was activated by APP who began performing CPR.  ACLS initiated.  Patient was intubated by APP.  Patient was noted to be in PEA for the majority of the code.  Several rounds of epi given, bicarb given, calcium given.  Given her history of active Hodgkin's lymphoma, we considered pulmonary embolism.  Patient also had a history of AAA.  I spoke with the patient's niece Ronna Polio who is the power of attorney and stated that the patient would not want prolonged resuscitation.  We continued ACLS for an additional 10 minutes without return of spontaneous circulation.  Time of death 28.  Vitals:   02-06-23 0103  BP: (!) 140/97  Pulse: (!) 132  Resp: 18  Temp: 98.6 F (37 C)  SpO2: 96%     EKG Interpretation Date/Time:  Saturday 02-06-2023 01:01:18 EDT Ventricular Rate:  125 PR Interval:  110 QRS Duration:  77 QT Interval:  320 QTC Calculation: 462 R Axis:   -35  Text Interpretation: Sinus tachycardia with irregular rate Inferior infarct, old Consider anterior infarct When compared with ECG of 01/06/2023, No significant change was found Confirmed by Dione Booze ([id number]) on 06-Feb-2023 3:42:53 AM       CPR  Date/Time: 02/06/2023 7:33 AM  Performed by: Nira Conn, MD Authorized by: Nira Conn, MD  CPR Procedure Details:      Amount of time prior to administration of ACLS/BLS (minutes):  0   ACLS/BLS initiated by EMS: No     CPR/ACLS performed in the ED: Yes     Duration of CPR (minutes):  23   Outcome: Pt declared dead    CPR performed via ACLS guidelines under  my direct supervision.  See RN documentation for details including defibrillator use, medications, doses and timing.        Nira Conn, MD 02/06/23 [phone]

## 2023-02-23 NOTE — Progress Notes (Signed)
   02/11/2023 0250  Spiritual Encounters  Type of Visit Initial  Care provided to: [facility] partners present during encounter Nurse  Referral source Code page  Reason for visit Patient death  OnCall Visit Yes   Chaplain responded to a call for support of family. The patient Jennifer Juarez died and family member requested prayer. I met with family member, Jennifer Juarez, who shared a bit of [name]'s story. He was one of several nieces and nephews who were family to [place]. Together we prayed for her and for the family and their well being in this challenging event in their life.   Valerie [name] [phone]

## 2023-02-23 NOTE — ED Notes (Addendum)
Code called at 0151 by Berle Mull PA,  Regan Lemming, [place]ry [place], [place], responded. Chest compressions started pt was in PEA at the time.  4742 epinepherine 1 mg given Dr Eudelia Bunch responded at 0154 0155 epi given, 0156 Ileana Ladd, Berna Bue, and 2-3 techs arrived. 0157 pulse check, no pulse, PEA, resume compressions  0158 epi given, respiratory arrived 0159 bicarb given 0200 pulse check no pulse,PEA, resume compressions, Intubated with a 7.5 ETT verified with co2 dectector, color           change And auscultation, done by Uvaldo Rising PA 0201 Epi given 0203 pulse check, no pulse, PEA,resumed compressions. 0204 amp of bicarb given, and EPI 0206 pulse check, no pulse, PEA, resume compressions 0207 Epi given 0208 calcium given 0208 pulse check, no pulse, PEA, resume compressions 0210 pulse check, no pulse, PEA, resume compressions, gave Epi  0212 pulse check, no pulse, PEA, resume compressions 0213 Time of death.

## 2023-02-23 NOTE — ED Triage Notes (Signed)
Pt went to get up and go to bed when she felt too  weak to stand. She slid from chair to floor.  She said that she started a new heart med Toporol on 7-15 for the a-fib.  With ems she had a hr running from 80 to 130's. No complaints of pain.

## 2023-02-23 DEATH — deceased

## 2023-04-14 ENCOUNTER — Ambulatory Visit: Payer: Medicare HMO | Admitting: Cardiovascular Disease
